# Patient Record
Sex: Male | Born: 1975 | Hispanic: Yes | Smoking: Never smoker
Health system: Southern US, Community
[De-identification: ages and names within clinical notes are randomized; demographics above are authoritative.]

## PROBLEM LIST (undated history)

## (undated) DIAGNOSIS — R7989 Other specified abnormal findings of blood chemistry: Secondary | ICD-10-CM

## (undated) DIAGNOSIS — I493 Ventricular premature depolarization: Secondary | ICD-10-CM

## (undated) DIAGNOSIS — M810 Age-related osteoporosis without current pathological fracture: Secondary | ICD-10-CM

## (undated) DIAGNOSIS — R945 Abnormal results of liver function studies: Secondary | ICD-10-CM

## (undated) DIAGNOSIS — J302 Other seasonal allergic rhinitis: Secondary | ICD-10-CM

## (undated) DIAGNOSIS — K5090 Crohn's disease, unspecified, without complications: Secondary | ICD-10-CM

## (undated) DIAGNOSIS — K8301 Primary sclerosing cholangitis: Secondary | ICD-10-CM

## (undated) HISTORY — DX: Primary sclerosing cholangitis: K83.01

## (undated) HISTORY — DX: Other seasonal allergic rhinitis: J30.2

## (undated) HISTORY — DX: Other specified abnormal findings of blood chemistry: R79.89

## (undated) HISTORY — DX: Age-related osteoporosis without current pathological fracture: M81.0

## (undated) HISTORY — DX: Abnormal results of liver function studies: R94.5

## (undated) HISTORY — DX: Crohn's disease, unspecified, without complications: K50.90

## (undated) HISTORY — DX: Ventricular premature depolarization: I49.3

---

## 2004-08-21 ENCOUNTER — Encounter: Admission: RE | Admit: 2004-08-21 | Discharge: 2004-08-21 | Payer: Self-pay | Admitting: Internal Medicine

## 2005-12-31 ENCOUNTER — Encounter: Admission: RE | Admit: 2005-12-31 | Discharge: 2005-12-31 | Payer: Self-pay | Admitting: Gastroenterology

## 2007-06-18 ENCOUNTER — Encounter: Admission: RE | Admit: 2007-06-18 | Discharge: 2007-06-18 | Payer: Self-pay | Admitting: Gastroenterology

## 2007-06-28 ENCOUNTER — Encounter: Admission: RE | Admit: 2007-06-28 | Discharge: 2007-06-28 | Payer: Self-pay | Admitting: Gastroenterology

## 2007-08-20 ENCOUNTER — Encounter (INDEPENDENT_AMBULATORY_CARE_PROVIDER_SITE_OTHER): Payer: Self-pay | Admitting: Interventional Radiology

## 2007-08-20 ENCOUNTER — Ambulatory Visit (HOSPITAL_COMMUNITY): Admission: RE | Admit: 2007-08-20 | Discharge: 2007-08-20 | Payer: Self-pay | Admitting: Gastroenterology

## 2007-08-25 ENCOUNTER — Emergency Department (HOSPITAL_COMMUNITY): Admission: EM | Admit: 2007-08-25 | Discharge: 2007-08-25 | Payer: Self-pay | Admitting: Emergency Medicine

## 2007-08-26 ENCOUNTER — Emergency Department (HOSPITAL_COMMUNITY): Admission: EM | Admit: 2007-08-26 | Discharge: 2007-08-27 | Payer: Self-pay | Admitting: *Deleted

## 2007-09-06 ENCOUNTER — Ambulatory Visit (HOSPITAL_COMMUNITY): Admission: RE | Admit: 2007-09-06 | Discharge: 2007-09-06 | Payer: Self-pay | Admitting: Gastroenterology

## 2008-09-09 IMAGING — CR DG ABDOMEN ACUTE W/ 1V CHEST
3 series · 3 of 3 positions shown · non-contrast
Comparison: CT 08/25/07.

CLINICAL DATA: 30 year-old, constipation.
 ACUTE ABDOMEN SERIES:

[w chest pa *]
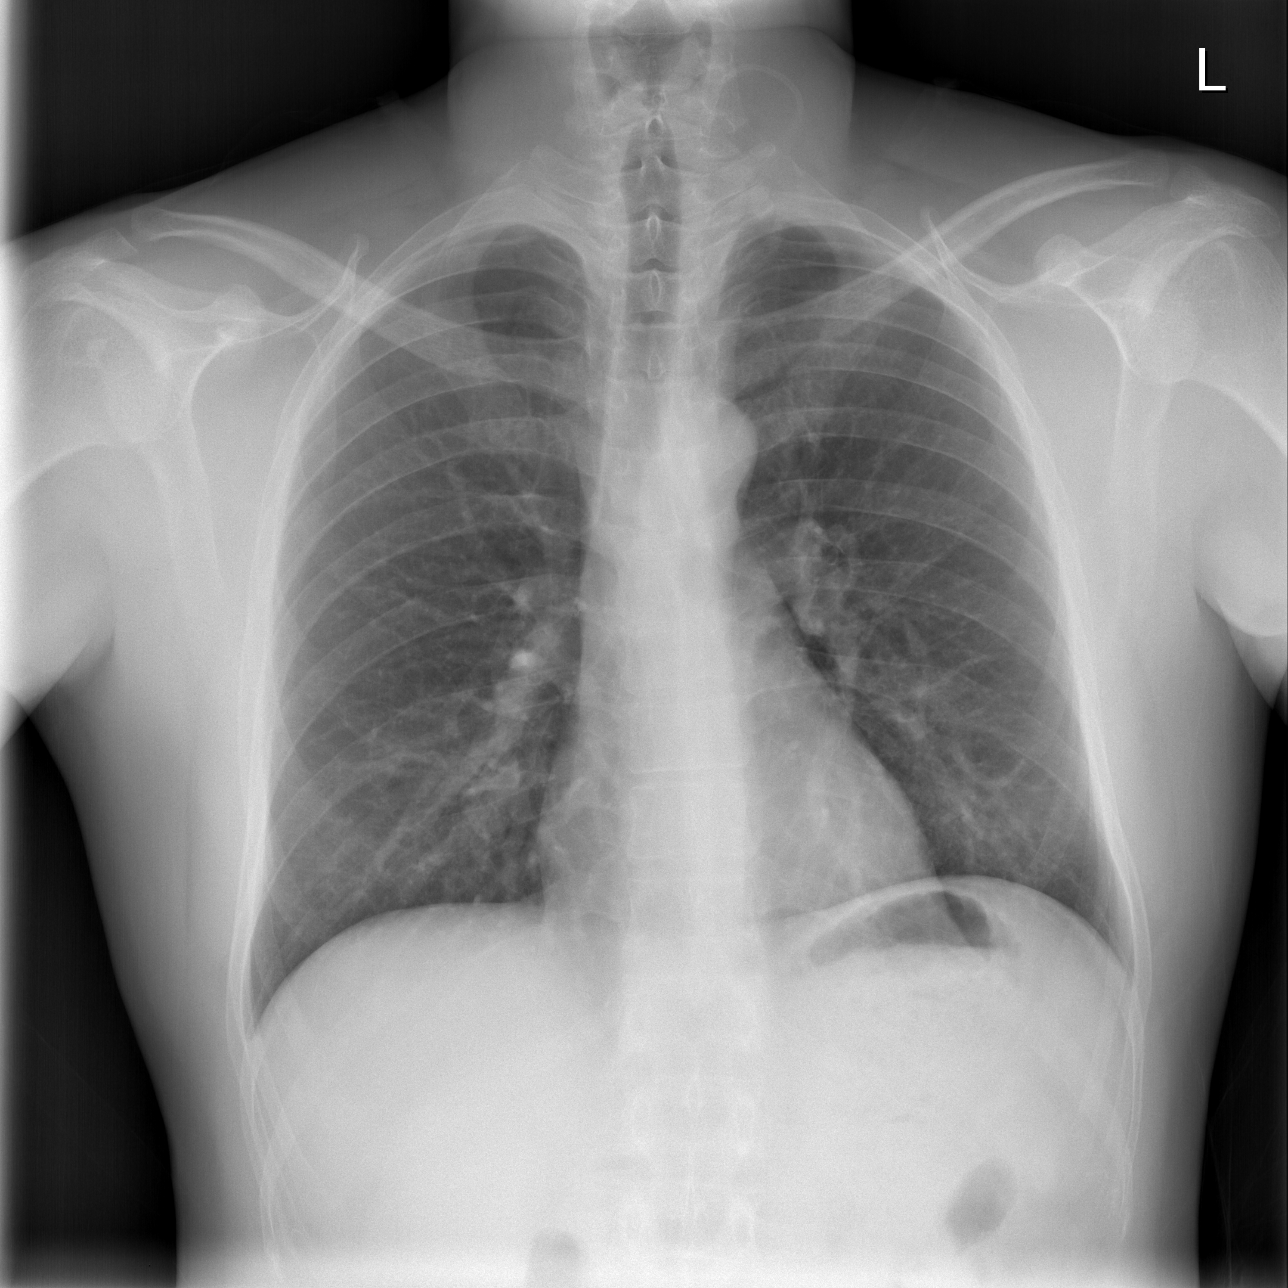

[w abdomen upright]
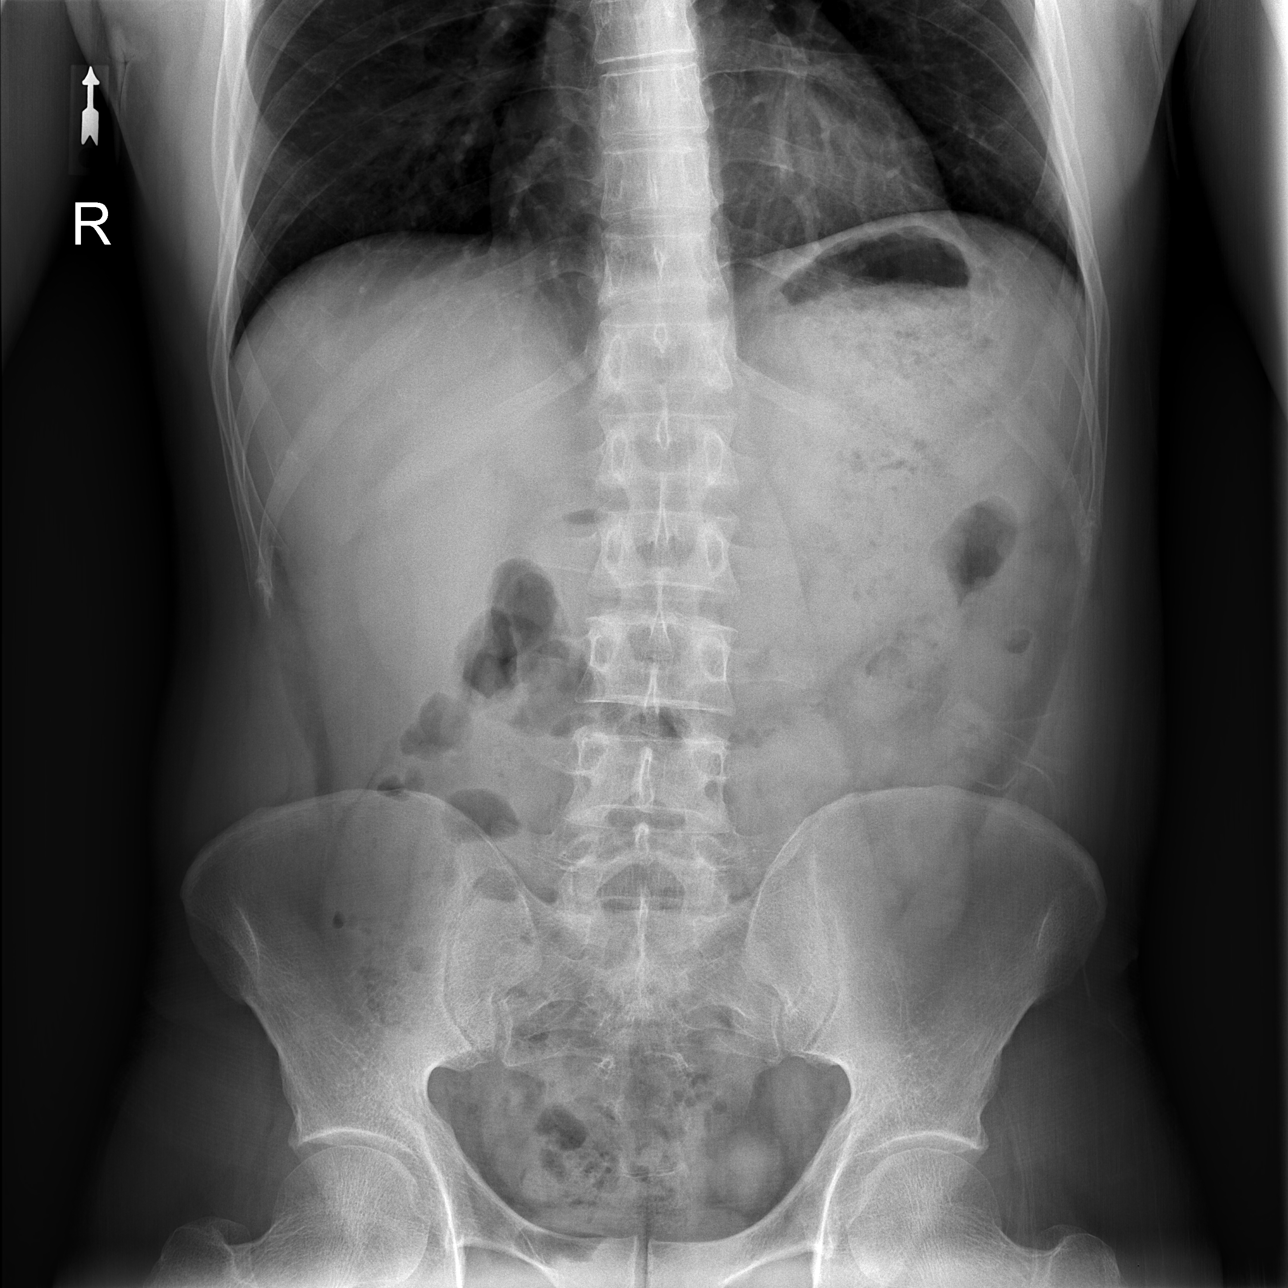

[t abdomen supine]
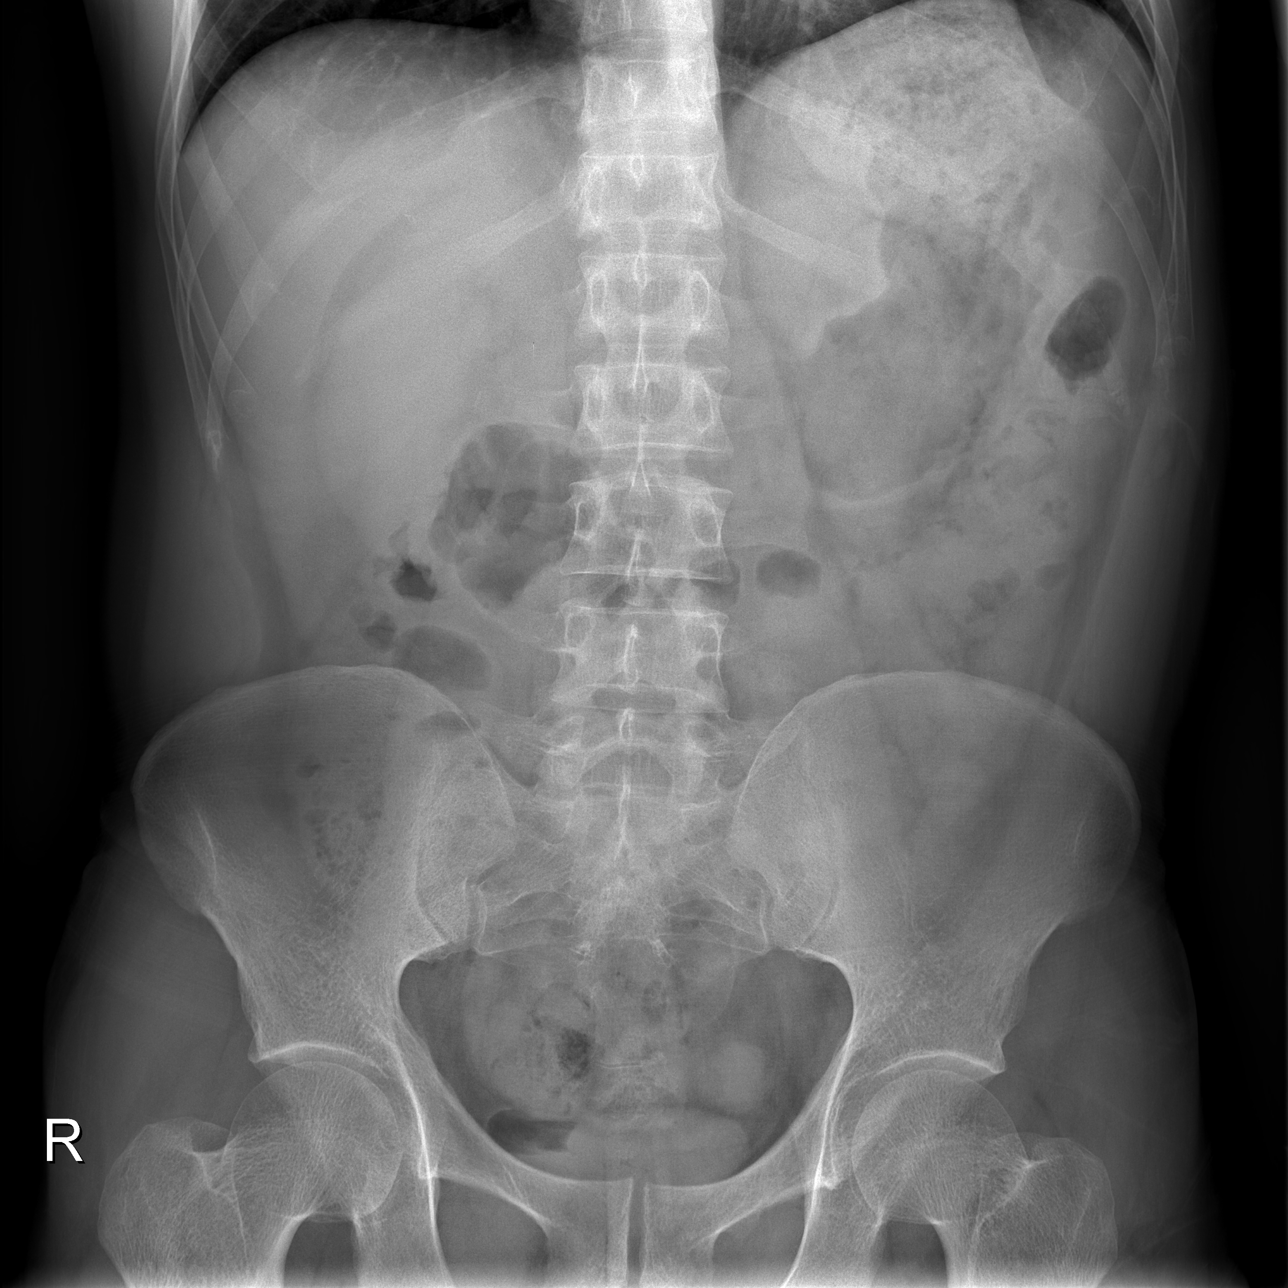

[3 of 3 positions shown; findings below may reference images not displayed]

FINDINGS: Cardiopericardial silhouette is within normal limits for size.  The lungs are clear. Supine and upright views of the abdomen demonstrate moderate amount of stool within the distal transverse and descending colon. There is no obstruction or free air.
IMPRESSION: 1.  No acute cardiopulmonary disease.
 2.  No obstruction or free air.
 3.  Moderate amount of stool in the descending colon.

## 2010-01-14 ENCOUNTER — Encounter: Admission: RE | Admit: 2010-01-14 | Discharge: 2010-01-14 | Payer: Self-pay | Admitting: Gastroenterology

## 2010-06-15 ENCOUNTER — Ambulatory Visit: Payer: Self-pay | Admitting: Diagnostic Radiology

## 2010-06-15 ENCOUNTER — Emergency Department (HOSPITAL_BASED_OUTPATIENT_CLINIC_OR_DEPARTMENT_OTHER): Admission: EM | Admit: 2010-06-15 | Discharge: 2010-06-15 | Payer: Self-pay | Admitting: Emergency Medicine

## 2010-07-12 ENCOUNTER — Ambulatory Visit: Payer: Self-pay | Admitting: Internal Medicine

## 2010-07-12 DIAGNOSIS — J189 Pneumonia, unspecified organism: Secondary | ICD-10-CM | POA: Insufficient documentation

## 2010-07-16 ENCOUNTER — Encounter: Payer: Self-pay | Admitting: Internal Medicine

## 2010-07-30 ENCOUNTER — Ambulatory Visit: Payer: Self-pay | Admitting: Internal Medicine

## 2010-07-30 DIAGNOSIS — J330 Polyp of nasal cavity: Secondary | ICD-10-CM | POA: Insufficient documentation

## 2010-08-13 ENCOUNTER — Encounter: Payer: Self-pay | Admitting: Internal Medicine

## 2010-08-15 ENCOUNTER — Telehealth (INDEPENDENT_AMBULATORY_CARE_PROVIDER_SITE_OTHER): Payer: Self-pay | Admitting: *Deleted

## 2010-08-26 ENCOUNTER — Encounter: Payer: Self-pay | Admitting: Internal Medicine

## 2010-11-04 ENCOUNTER — Encounter: Payer: Self-pay | Admitting: Internal Medicine

## 2010-11-24 HISTORY — PX: NASAL SINUS SURGERY: SHX719

## 2010-12-08 ENCOUNTER — Telehealth: Payer: Self-pay | Admitting: Internal Medicine

## 2010-12-11 ENCOUNTER — Encounter
Admission: RE | Admit: 2010-12-11 | Discharge: 2010-12-11 | Payer: Self-pay | Source: Home / Self Care | Attending: Gastroenterology | Admitting: Gastroenterology

## 2010-12-25 NOTE — Letter (Signed)
Summary: Records Dated 08-31-09 thru 07-09-10/[facility]  Records Dated 08-31-09 thru 07-09-10/[facility]   Imported By: Edmonia James 07/25/2010 10:34:07  _____________________________________________________________________  External Attachment:    Type:   Image     Comment:   External Document  Appended Document: Records Dated 08-31-09 thru 07-09-10/[facility] ENT Notes Chest x-ray 07-09-10 right middle lobe pneumonia

## 2010-12-25 NOTE — Assessment & Plan Note (Signed)
Summary: pain rt lung/cbs   Vital Signs:  Patient profile:   35 year old male Height:      66.5 inches Weight:      143.50 pounds BMI:     22.90 Pulse rate:   99 / minute Pulse rhythm:   regular BP sitting:   138 / 80  (left arm) Cuff size:   large  Vitals Entered By: Allyn Kenner CMA (July 12, 2010 3:35 PM) CC: 2nd Opinon on Pneumoia?  Comments Had a sinus infection 3 weeks ago was on ATB A week after developed a sharp pain in LLQ went to ER had xray and CT done was told he had water outside of lung then having pain in lower back and shoulders Was scheduled for Sinus Surgery next week Dr Nile Dear said he needed a Chest xray before had that done on Tuesday and was told he had Pneumoia- upset because he said that was the first time he had heard that.    History of Present Illness: new patient Last office visit approximately  6 years ago since the last office visit, he has been diagnosed with Primary sclerosing cholangitis and  Crohn's disease.   He is here w/ the following history  approximately 4 weeks ago developed cough, sputum production, chest congestion and shortness of breath on and off. Symptoms were mostly at night. He went to see his ENT doctor, Dr Nile Dear ([facility]) who describes some medication, prednisone? ( was prescribed 5 the first day, for the next day, 3 the next day etc. etc.) After that he felt better temporarily. On 06-15-10 he went to the ER with severe left-sided chest pain, she was diagnosed with possibly pleuresy and was prescribe Percocet. He went to ENT again, the pain then went from the left to the right side of the chest. 3 days ago, he had a chest x-ray for pre-surgical evaluation (he will have a  sinus polypectomy) and it showed pneumonia. The patient was prescribed cefnidir  ER records from 06-15-10 Lipase normal, potassium 4.0, creatinine 0.8 AST 39 slightly elevated, ALT 102, elevated. Alkaline phosphatase 248 elevated D-dimer 1.44  elevated White blood cells 12.5, hemoglobin 11.8, platelets 429 Chest x-ray negative CT angiogram of the chest show a tiny left pleural effusion otherwise negative   Current Medications (verified): 1)  Urosodil 50 Mg .Marland Kitchen.. 1 By Mouth Two Times A Day 2)  Lialda 1.2 Gm Tbec (Mesalamine) .... 2 By Mouth Daily. 3)  Prednisone 20 Mg Tabs (Prednisone) .... 2 By Mouth Two Times A Day 4)  Cefdinir 300 Mg Caps (Cefdinir) .Marland Kitchen.. 1 By Mouth Two Times A Day  Allergies (verified): No Known Drug Allergies  Past History:  Past Medical History: Primary sclerosing cholangitis.   Crohn's disease. Elevated liver function tests.  Past Surgical History: no major   Family History: prostate ca--no colon ca--no lung ca--no  Social History: single no children tobacco--no ETOH--no original from [place]   Review of Systems       for the last couple of weeks, he has been feeling better Denies fever No nausea or vomiting He did have diarrhea for a few days with some blood. His GI doctor prescribed prednisone No cough, sputum production or shortness of breath for the last 2 weeks The chest pain is resolved, if anything he is a slightly sore at the bases of the chest.  Physical Exam  General:  alert, well-developed, and well-nourished.   Head:  face symmetric Nose:  not congested Lungs:  normal respiratory effort, no intercostal retractions, and no accessory muscle use.  no increased work of breathing, lungs are clear, slightly decreased breath sounds at bases Heart:  normal rate, regular rhythm, no murmur, and no gallop.   Abdomen:  soft, non-tender, no distention, no masses, no guarding, and no rigidity.   Extremities:  number lower extremity edema   Impression & Recommendations:  Problem # 1:  PNEUMONIA (ICD-486) see HPI, he had CP 4 weeks ago, apparently was described prednisone, after that he went to the ER and a CT of the chest was negative. In the last 2 weeks he has  improved however 3 days ago a chest x-ray showed pneumonia Is possible that he had pneumonia w/ atypical sx because sx were  masked by steroids. Plan: Continue with   Cefdinir 300 Mg Caps  b.i.d. for 10 days Add a Z-Pak to cover atypicals Get records from ENT get chest x-ray report Return to the office in 2 weeks  Problem # 2:  time spent >> 25 min gathering info about the ER visit   Complete Medication List: 1)  Urosodil 50 Mg  .Marland KitchenMarland Kitchen. 1 by mouth two times a day 2)  Lialda 1.2 Gm Tbec (Mesalamine) .... 2 by mouth daily. 3)  Prednisone 20 Mg Tabs (Prednisone) .... 2 by mouth two times a day 4)  Cefdinir 300 Mg Caps (Cefdinir) .Marland Kitchen.. 1 by mouth two times a day 5)  Zithromax Z-pak 250 Mg Tabs (Azithromycin) .... As directed  Patient Instructions: 1)   Please fax a release of information to ENT and radiology 2)  Please schedule a follow-up appointment in 2 weeks.  Prescriptions: ZITHROMAX Z-PAK 250 MG TABS (AZITHROMYCIN) as directed  #1 x 0   Entered and Authorized by:   Alda Berthold. Paz MD   Signed by:   Alda Berthold. Paz MD on 07/12/2010   Method used:   Print then Give to Patient   RxID:   [id number]

## 2010-12-25 NOTE — Letter (Signed)
Summary: [facility]  [facility]   Imported By: Edmonia James 08/06/2010 07:50:35  _____________________________________________________________________  External Attachment:    Type:   Image     Comment:   External Document

## 2010-12-25 NOTE — Assessment & Plan Note (Signed)
Summary: 2 week followup/kn   Vital Signs:  Patient profile:   35 year old male Weight:      146 pounds Pulse rate:   84 / minute Pulse rhythm:   regular BP sitting:   120 / 78  (left arm) Cuff size:   large  Vitals Entered By: Allyn Kenner CMA (July 30, 2010 10:48 AM) CC: 2 week f/u - Feeling better.  Comments X-rays?    History of Present Illness: here for followup Old records are reviewed, on 07-09-10 he had a chest x-ray and showed  a right middle lobe pneumonia He has finished his antibiotics without apparent side effects  ROS Denies current fever, cough. Still has the ill-defined discomfort on the right chest, much improvement compared to a couple of weeks ago. his sinus symptoms are improved with nasal steroids  Current Medications (verified): 1)  Urosodil 50 Mg .Marland Kitchen.. 1 By Mouth Two Times A Day 2)  Lialda 1.2 Gm Tbec (Mesalamine) .... 2 By Mouth Daily. 3)  Prednisone 20 Mg Tabs (Prednisone) .Marland Kitchen.. 1 1/2 By Mouth Two Times A Day For 2 Weeks.  Allergies (verified): No Known Drug Allergies  Past History:  Past Medical History: Reviewed history from 07/12/2010 and no changes required. Primary sclerosing cholangitis.   Crohn's disease. Elevated liver function tests.  Past Surgical History: Reviewed history from 07/12/2010 and no changes required. no major   Social History: Reviewed history from 07/12/2010 and no changes required. single no children tobacco--no ETOH--no original from [place]  Physical Exam  General:  alert, well-developed, and well-nourished.   Lungs:  normal respiratory effort, no intercostal retractions, no accessory muscle use, and normal breath sounds.   Heart:  normal rate, regular rhythm, and no murmur.     Impression & Recommendations:  Problem # 1:  PNEUMONIA (ICD-486) improving plan: Chest x-ray in 2 weeks The following medications were removed from the medication list:    Cefdinir 300 Mg Caps (Cefdinir)  .Marland Kitchen... 1 by mouth two times a day    Zithromax Z-pak 250 Mg Tabs (Azithromycin) .Marland Kitchen... As directed  Problem # 2:  NASAL POLYP (ICD-471.0) history of nasal polyps, deviated septum, frequent sinusitis Planning  to have surgery per ENT  Complete Medication List: 1)  Urosodil 50 Mg  .Marland KitchenMarland Kitchen. 1 by mouth two times a day 2)  Lialda 1.2 Gm Tbec (Mesalamine) .... 2 by mouth daily. 3)  Prednisone 20 Mg Tabs (Prednisone) .Marland Kitchen.. 1 1/2 by mouth two times a day for 2 weeks. 4)  Chest X-ray  .... Chest x-ray, pa and lateral dx pneumonia to be done at [facility] fax results to dr Larose Kells [phone]  Patient Instructions: 1)  Please schedule a follow-up appointment in 3 months, fasting, physical exam Prescriptions: CHEST X-RAY chest x-ray, PA and lateral DX pneumonia To be done at [place] [id number]  #0 x 0   Entered and Authorized by:   Alda Berthold. Paz MD   Signed by:   Alda Berthold. Paz MD on 07/30/2010   Method used:   Print then Give to Patient   RxID:   [id number]

## 2010-12-25 NOTE — Progress Notes (Signed)
Summary: XRAYS RESULTS  Phone Note Call from Patient Call back at [facility] Phone [phone]   Caller: Patient Summary of Call: PATIENT SAID HE HAD XRAYS TWO DAYS AGO AND HASNT HEARD THE RESULTS---PLEASE CALL HIM AT [phone] Initial call taken by: Berneta Sages,  August 15, 2010 9:53 AM  Follow-up for Phone Call        Spoke with pt and informed him that we have not got X-ray REsults. [facility] will fax them over to [place]. [place]  August 15, 2010 12:48 PM

## 2010-12-25 NOTE — Letter (Signed)
Summary: [facility]  [facility]   Imported By: Edmonia James 09/04/2010 11:17:43  _____________________________________________________________________  External Attachment:    Type:   Image     Comment:   External Document

## 2010-12-26 NOTE — Letter (Signed)
Summary: [facility]  [facility]   Imported By: Edmonia James 11/14/2010 11:32:43  _____________________________________________________________________  External Attachment:    Type:   Image     Comment:   External Document

## 2010-12-26 NOTE — Progress Notes (Signed)
Summary: due CXR  Phone Note Outgoing Call   Summary of Call: advise patient: due for repeated CXR : dx abnormal CXR to be done at [facility] (were he had the original XR) Sylvan Lahm E. Kelvin Sennett MD  December 08, 2010 2:59 PM   Follow-up for Phone Call        Pt is aware, he will pick up Chest Xray order to take w/ him. Allyn Kenner CMA  December 09, 2010 2:12 PM     New/Updated Medications: * CHEST X-RAY CXR : dx abnormal CXR done at [place] [id number] Prescriptions: CHEST X-RAY CXR : dx abnormal CXR done at [place] [phone] [id number]  #0 x 0   Entered by:   Allyn Kenner CMA   Authorized by:   Alda Berthold. Venba Zenner MD   Signed by:   Allyn Kenner CMA on 12/09/2010   Method used:   Print then Give to Patient   RxID:   [id number]

## 2011-02-08 LAB — COMPREHENSIVE METABOLIC PANEL
ALT: 102 U/L — ABNORMAL HIGH (ref 0–53)
AST: 39 U/L — ABNORMAL HIGH (ref 0–37)
Albumin: 4.2 g/dL (ref 3.5–5.2)
Alkaline Phosphatase: 248 U/L — ABNORMAL HIGH (ref 39–117)
Potassium: 4 mEq/L (ref 3.5–5.1)
Sodium: 143 mEq/L (ref 135–145)
Total Bilirubin: 0.9 mg/dL (ref 0.3–1.2)

## 2011-02-08 LAB — DIFFERENTIAL
Basophils Absolute: 0 10*3/uL (ref 0.0–0.1)
Eosinophils Absolute: 0.5 10*3/uL (ref 0.0–0.7)
Monocytes Absolute: 1.3 10*3/uL — ABNORMAL HIGH (ref 0.1–1.0)
Monocytes Relative: 11 % (ref 3–12)
Neutro Abs: 8.2 10*3/uL — ABNORMAL HIGH (ref 1.7–7.7)
Neutrophils Relative %: 65 % (ref 43–77)

## 2011-02-08 LAB — CBC
MCHC: 31.4 g/dL (ref 30.0–36.0)
Platelets: 429 10*3/uL — ABNORMAL HIGH (ref 150–400)
RBC: 4.77 MIL/uL (ref 4.22–5.81)
RDW: 15.7 % — ABNORMAL HIGH (ref 11.5–15.5)

## 2011-02-08 LAB — D-DIMER, QUANTITATIVE: D-Dimer, Quant: 1.44 ug/mL-FEU — ABNORMAL HIGH (ref 0.00–0.48)

## 2011-02-08 LAB — LIPASE, BLOOD: Lipase: 250 U/L (ref 23–300)

## 2011-02-21 ENCOUNTER — Other Ambulatory Visit: Payer: Self-pay | Admitting: Gastroenterology

## 2011-03-18 ENCOUNTER — Encounter (HOSPITAL_COMMUNITY)
Admission: RE | Admit: 2011-03-18 | Discharge: 2011-03-18 | Disposition: A | Discharge status: Home / Self Care | Payer: BC Managed Care – PPO | Source: Ambulatory Visit | Attending: Gastroenterology | Admitting: Gastroenterology

## 2011-03-18 DIAGNOSIS — D649 Anemia, unspecified: Secondary | ICD-10-CM | POA: Insufficient documentation

## 2011-03-18 LAB — ABO/RH: ABO/RH(D): B POS

## 2011-03-19 ENCOUNTER — Ambulatory Visit (HOSPITAL_COMMUNITY): Payer: BC Managed Care – PPO | Attending: Gastroenterology

## 2011-03-19 ENCOUNTER — Encounter (HOSPITAL_COMMUNITY): Payer: BC Managed Care – PPO

## 2011-03-19 ENCOUNTER — Other Ambulatory Visit: Payer: Self-pay | Admitting: Gastroenterology

## 2011-03-19 DIAGNOSIS — D649 Anemia, unspecified: Secondary | ICD-10-CM | POA: Insufficient documentation

## 2011-03-19 LAB — CBC
HCT: 34.1 % — ABNORMAL LOW (ref 39.0–52.0)
Hemoglobin: 9.6 g/dL — ABNORMAL LOW (ref 13.0–17.0)
MCH: 17.9 pg — ABNORMAL LOW (ref 26.0–34.0)
MCHC: 28.2 g/dL — ABNORMAL LOW (ref 30.0–36.0)
MCV: 63.5 fL — ABNORMAL LOW (ref 78.0–100.0)
Platelets: 390 10*3/uL (ref 150–400)
RDW: 21.8 % — ABNORMAL HIGH (ref 11.5–15.5)

## 2011-03-20 ENCOUNTER — Encounter (HOSPITAL_COMMUNITY): Payer: BC Managed Care – PPO

## 2011-03-20 LAB — CROSSMATCH
ABO/RH(D): B POS
Unit division: 0

## 2011-06-30 IMAGING — CT CT ANGIO CHEST
2 of 6 series · 19 of 36 positions shown · IV contrast (APPLIED)
Comparison: None.

CLINICAL DATA: Left-sided chest pain which began earlier today.
Elevated D-dimer.

CT ANGIOGRAPHY CHEST WITH CONTRAST 06/15/2010:
TECHNIQUE: Multidetector CT imaging of the chest was performed
using the standard protocol during bolus administration of
intravenous contrast.  Multiplanar CT image reconstructions
including MIPs were obtained to evaluate the vascular anatomy.
Contrast:  80 ml Omnipaque 350 IV.

[Series 5: pe 1.0 b25f · axial · 0.64mm/px · z∈[-259,-22]mm · 18 of 265 slices shown]
[im 14/265  lung]
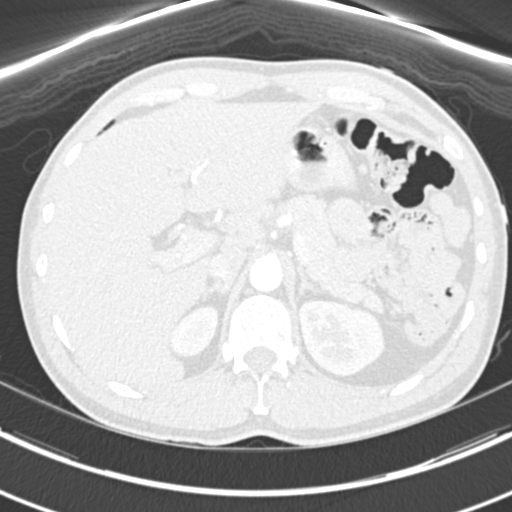
[im 27/265  mediastinal]
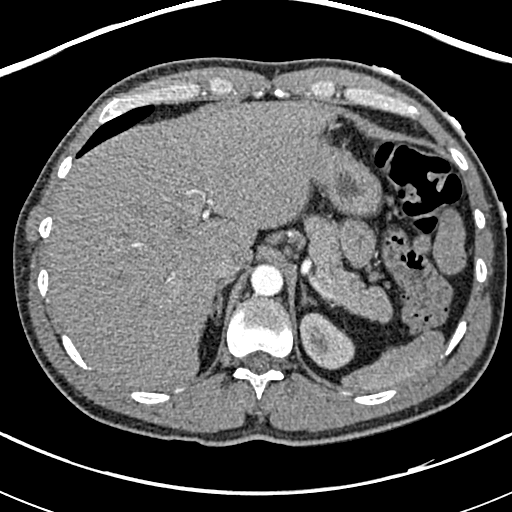
[im 40/265  lung]
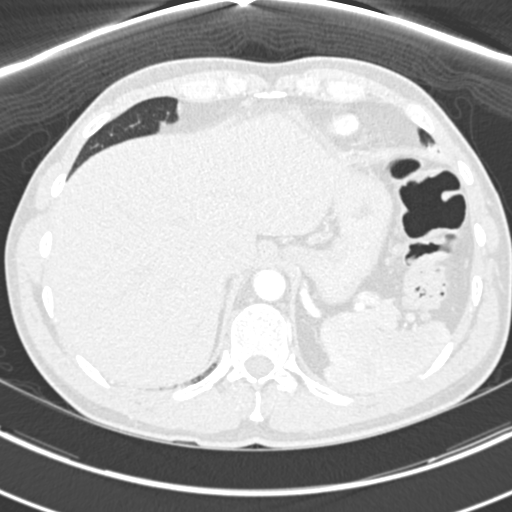
[im 53/265  mediastinal]
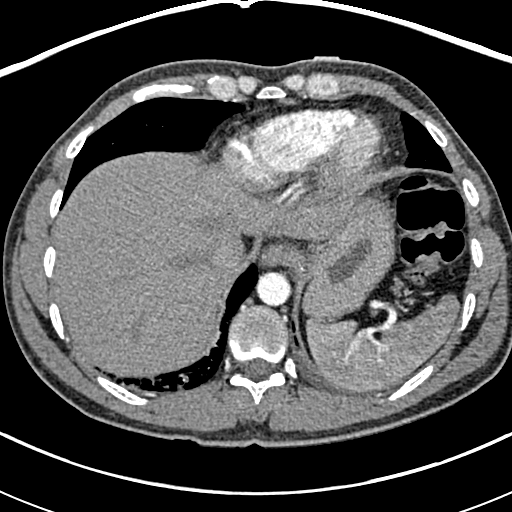
[im 67/265  lung]
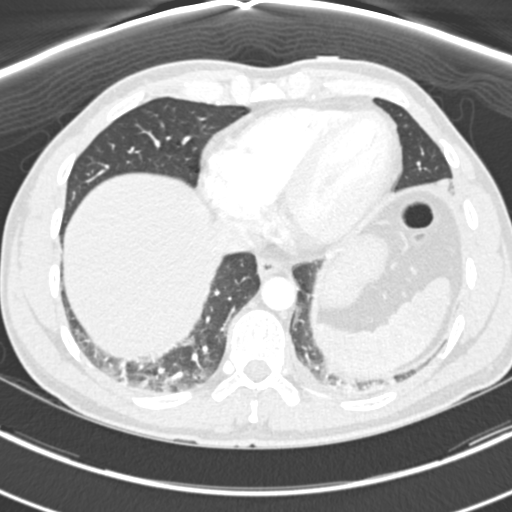
[im 80/265  mediastinal]
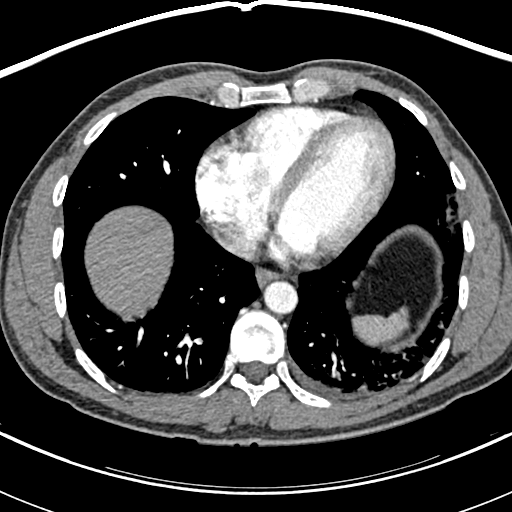
[im 93/265  lung]
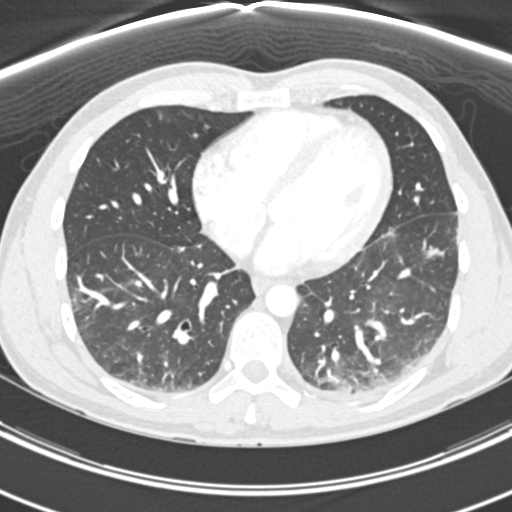
[im 106/265  mediastinal]
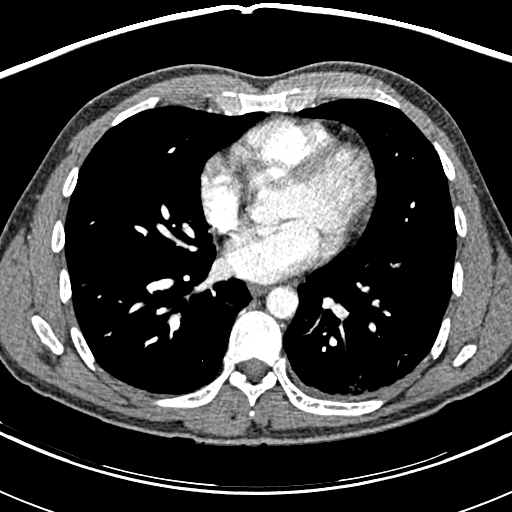
[im 119/265  lung]
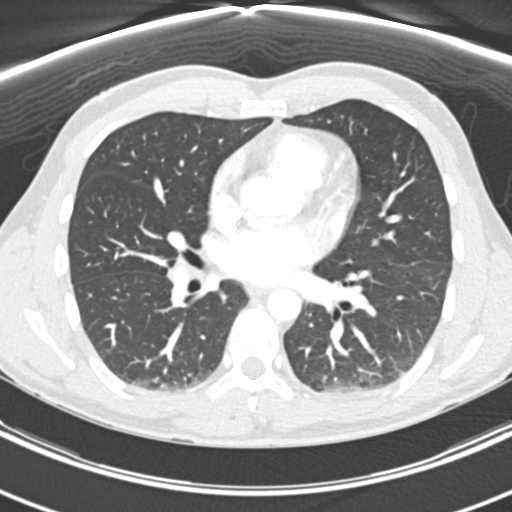
[im 146/265  mediastinal]
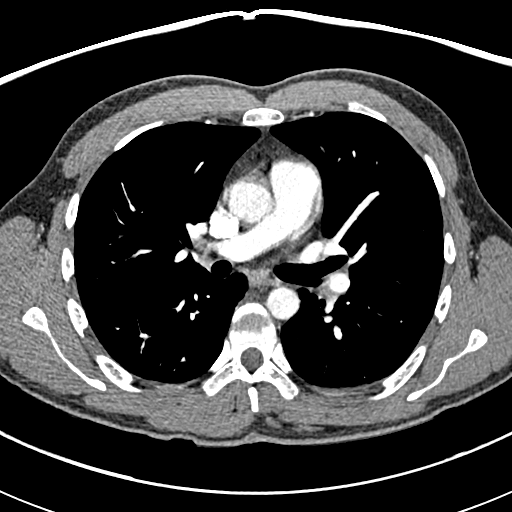
[im 159/265  lung]
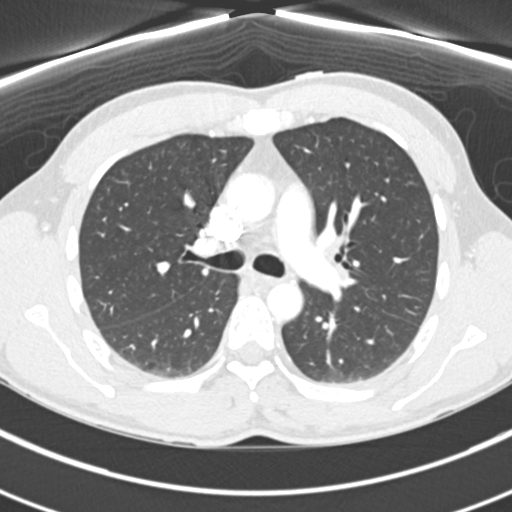
[im 172/265  mediastinal]
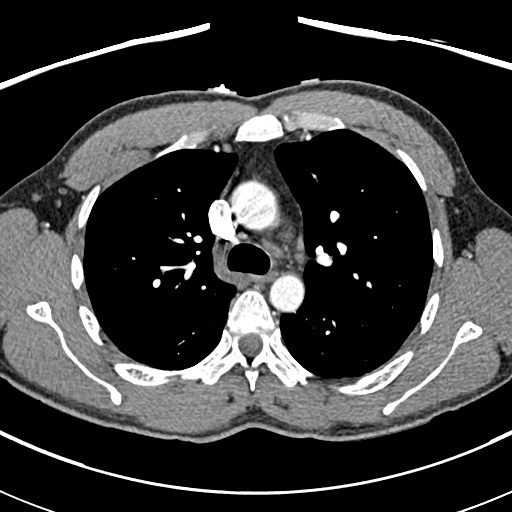
[im 185/265  lung]
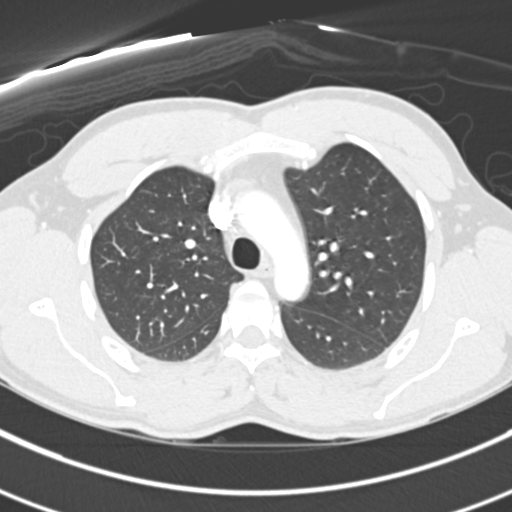
[im 199/265  mediastinal]
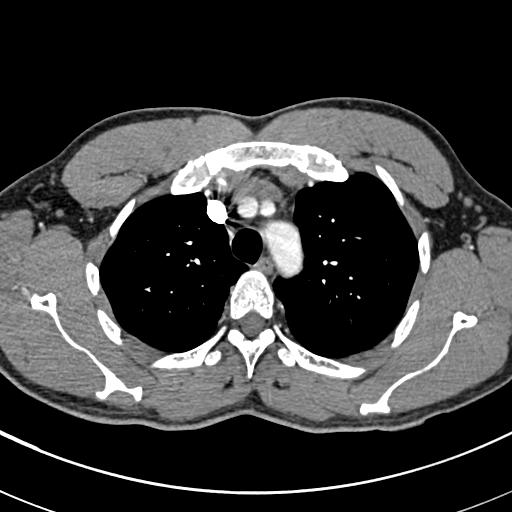
[im 212/265  lung]
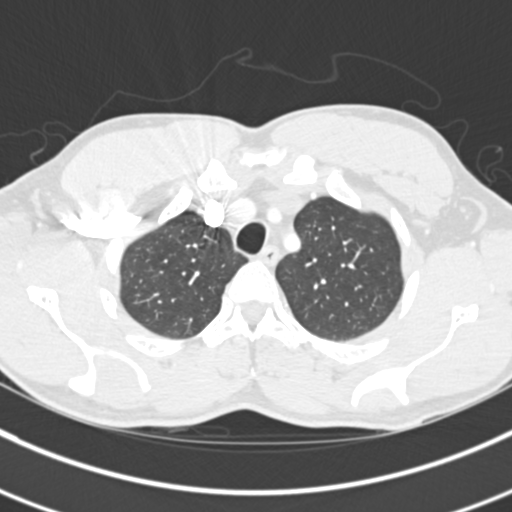
[im 225/265  mediastinal]
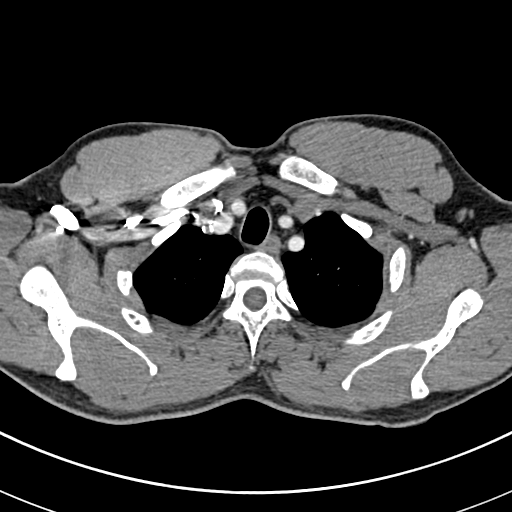
[im 238/265  lung]
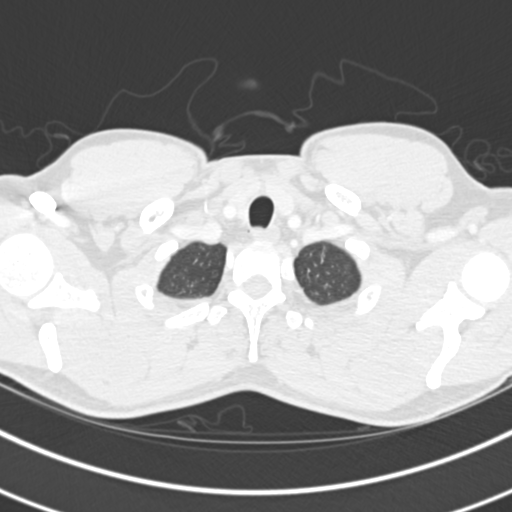
[im 251/265  mediastinal]
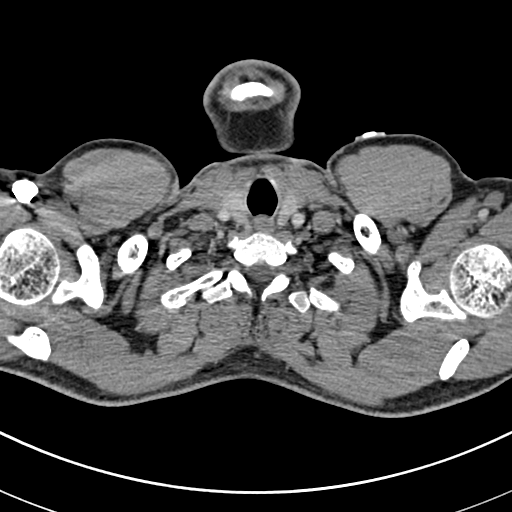

[Series 7: pe 2.0 coronal · coronal · 0.61mm/px · 1 of 124 slices shown]
[im 62/124  mediastinal]
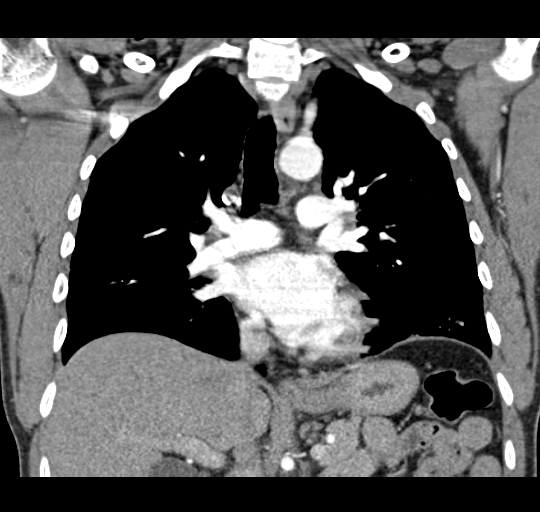

[19 of 36 positions shown; findings below may reference images not displayed]

FINDINGS: Contrast opacification of the pulmonary arteries is
good.  No filling defects within either main pulmonary artery or
their branches in either lung to suggest pulmonary embolism.  Heart
size normal.  Approximate 1.0 x 0.6 cm fat-containing nodule
involving the posterolateral wall of the the right atrium.  No
visible coronary artery calcification.  No visible atherosclerosis
involving the thoracic or upper abdominal aorta with their
branches.  No pericardial effusion.

Mild atelectasis deep in both lower lobes.  Lungs otherwise clear
without localized airspace consolidation, interstitial disease, or
nodularity.  Central airways patent without significant bronchial
wall thickening.  Tiny left pleural effusion.  No right pleural
effusion.

Scattered normal-sized lymph nodes throughout the mediastinum, both
hila, and both axillae; no significant lymphadenopathy.  Visualized
thyroid gland unremarkable.  Visualized upper abdomen unremarkable.
Bone window images unremarkable.

Review of the MIP images confirms the above findings.
IMPRESSION: 1.  No evidence of pulmonary embolism.
2.  Incidental approximate 1 cm lipoma involving the posterolateral
wall of the right atrium.
3.  Mild atelectasis deep in the lower lobes.  Tiny left pleural
effusion.  No acute cardiopulmonary disease otherwise.

## 2011-09-04 LAB — CBC
HCT: 41
HCT: 45.9
Hemoglobin: 14.3
Hemoglobin: 15.7
MCHC: 35.2
MCV: 89
Platelets: 273
Platelets: 251
RBC: 4.58
RDW: 13.2
WBC: 7.7

## 2011-09-04 LAB — URINALYSIS, ROUTINE W REFLEX MICROSCOPIC
Glucose, UA: NEGATIVE
Ketones, ur: NEGATIVE
Nitrite: NEGATIVE
Protein, ur: NEGATIVE
Urobilinogen, UA: 1
pH: 7
pH: 7

## 2011-09-04 LAB — COMPREHENSIVE METABOLIC PANEL
ALT: 201 — ABNORMAL HIGH
AST: 56 — ABNORMAL HIGH
Albumin: 4.3
Alkaline Phosphatase: 484 — ABNORMAL HIGH
BUN: 9
CO2: 30
Calcium: 9.6
Creatinine, Ser: 0.79
GFR calc Af Amer: >60
GFR calc non Af Amer: >60
Glucose, Bld: 124 — ABNORMAL HIGH
Glucose, Bld: 184 — ABNORMAL HIGH
Potassium: 4.3
Sodium: 135
Total Protein: 8.5 — ABNORMAL HIGH

## 2011-09-04 LAB — DIFFERENTIAL
Basophils Relative: 1
Eosinophils Absolute: 0.1
Eosinophils Absolute: 0.1
Lymphocytes Relative: 16
Lymphs Abs: 1.2
Lymphs Abs: 1.4
Monocytes Absolute: 0.8 — ABNORMAL HIGH
Monocytes Relative: 6
Neutrophils Relative %: 73

## 2011-09-04 LAB — PROTIME-INR: Prothrombin Time: 13

## 2012-05-13 ENCOUNTER — Other Ambulatory Visit: Payer: Self-pay | Admitting: Gastroenterology

## 2012-05-13 DIAGNOSIS — R7989 Other specified abnormal findings of blood chemistry: Secondary | ICD-10-CM

## 2012-05-19 ENCOUNTER — Ambulatory Visit
Admission: RE | Admit: 2012-05-19 | Discharge: 2012-05-19 | Disposition: A | Discharge status: Home / Self Care | Payer: BC Managed Care – PPO | Source: Ambulatory Visit | Attending: Gastroenterology | Admitting: Gastroenterology

## 2012-05-19 DIAGNOSIS — R7989 Other specified abnormal findings of blood chemistry: Secondary | ICD-10-CM

## 2012-05-19 MED ORDER — GADOBENATE DIMEGLUMINE 529 MG/ML IV SOLN
16.0000 mL | Freq: Once | INTRAVENOUS | Status: AC | PRN
  Administered 2012-05-19: 16 mL via INTRAVENOUS

## 2013-04-13 ENCOUNTER — Other Ambulatory Visit: Payer: Self-pay | Admitting: Gastroenterology

## 2013-04-13 DIAGNOSIS — K8309 Other cholangitis: Secondary | ICD-10-CM

## 2013-04-23 ENCOUNTER — Other Ambulatory Visit: Payer: BC Managed Care – PPO

## 2013-04-30 ENCOUNTER — Other Ambulatory Visit: Payer: BC Managed Care – PPO

## 2013-05-07 ENCOUNTER — Ambulatory Visit
Admission: RE | Admit: 2013-05-07 | Discharge: 2013-05-07 | Disposition: A | Discharge status: Home / Self Care | Payer: BC Managed Care – PPO | Source: Ambulatory Visit | Attending: Gastroenterology | Admitting: Gastroenterology

## 2013-05-07 DIAGNOSIS — K8309 Other cholangitis: Secondary | ICD-10-CM

## 2013-05-07 MED ORDER — GADOBENATE DIMEGLUMINE 529 MG/ML IV SOLN
16.0000 mL | Freq: Once | INTRAVENOUS | Status: AC | PRN
  Administered 2013-05-07: 16 mL via INTRAVENOUS

## 2014-10-16 ENCOUNTER — Other Ambulatory Visit: Payer: Self-pay | Admitting: Gastroenterology

## 2015-06-01 ENCOUNTER — Ambulatory Visit: Payer: Self-pay | Admitting: Internal Medicine

## 2015-06-01 ENCOUNTER — Ambulatory Visit (INDEPENDENT_AMBULATORY_CARE_PROVIDER_SITE_OTHER): Payer: BLUE CROSS/BLUE SHIELD | Admitting: Internal Medicine

## 2015-06-01 ENCOUNTER — Encounter: Payer: Self-pay | Admitting: Internal Medicine

## 2015-06-01 VITALS — BP 102/70 | HR 68 | Temp 98.0°F | Ht 66.5 in | Wt 177.0 lb

## 2015-06-01 DIAGNOSIS — R7989 Other specified abnormal findings of blood chemistry: Secondary | ICD-10-CM

## 2015-06-01 DIAGNOSIS — K5090 Crohn's disease, unspecified, without complications: Secondary | ICD-10-CM | POA: Insufficient documentation

## 2015-06-01 DIAGNOSIS — IMO0002 Reserved for concepts with insufficient information to code with codable children: Secondary | ICD-10-CM | POA: Insufficient documentation

## 2015-06-01 DIAGNOSIS — K50919 Crohn's disease, unspecified, with unspecified complications: Secondary | ICD-10-CM | POA: Diagnosis not present

## 2015-06-01 DIAGNOSIS — L03011 Cellulitis of right finger: Secondary | ICD-10-CM

## 2015-06-01 DIAGNOSIS — R945 Abnormal results of liver function studies: Secondary | ICD-10-CM

## 2015-06-01 DIAGNOSIS — K8301 Primary sclerosing cholangitis: Secondary | ICD-10-CM

## 2015-06-01 DIAGNOSIS — M810 Age-related osteoporosis without current pathological fracture: Secondary | ICD-10-CM | POA: Diagnosis not present

## 2015-06-01 HISTORY — DX: Age-related osteoporosis without current pathological fracture: M81.0

## 2015-06-01 MED ORDER — DOXYCYCLINE HYCLATE 100 MG PO TBEC: 100.0000 mg | DELAYED_RELEASE_TABLET | Freq: Two times a day (BID) | ORAL | Status: DC

## 2015-06-01 MED ORDER — CEPHALEXIN 500 MG PO CAPS: 500.0000 mg | ORAL_CAPSULE | Freq: Four times a day (QID) | ORAL | Status: DC

## 2015-06-01 MED ORDER — SULFACETAMIDE SODIUM (ACNE) 10 % EX LOTN
TOPICAL_LOTION | CUTANEOUS | Status: DC
Start: 2015-06-01 — End: 2015-12-25

## 2015-06-01 NOTE — Assessment & Plan Note (Signed)
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 Under the care of Dr. Michail Sermon. Labs from 04/2014: LFTs normal except for increased bilirubin, creatinine 0.7, potassium 4.7, CBC normal.

## 2015-06-01 NOTE — Assessment & Plan Note (Signed)
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

## 2015-06-01 NOTE — Assessment & Plan Note (Signed)
Patient was screened for osteoporosis by his GI doctors due to steroid use, dexa showed osteoporosis, subsequently he saw endocrinology, they felt that no treatment was indicated at the time and as long as he does not have a fracture or ongoing systemic steroid.

## 2015-06-01 NOTE — Patient Instructions (Signed)
Antibiotics  for one week  Apply the lotion twice a day until you see the specialist  Keep the area dry and uncover  Schedule a physical exam at your convenience

## 2015-06-01 NOTE — Progress Notes (Signed)
Pre visit review using our clinic review tool, if applicable. No additional management support is needed unless otherwise documented below in the visit note. 

## 2015-06-01 NOTE — Progress Notes (Signed)
Subjective:    Patient ID: Reginald Parker, male    DOB: [birth year], 39 y.o.   MRN: [id number]  DOS:  06/01/2015 Type of visit - description : New patient Interval history: Few months ago he had a infection at the right fifth finger, had swelling around the nail, he was able to get some pus out of it. Since then, the nail is changing color. 2 weeks ago the swelling come back, this time he has seen no discharge.  He has a extensive GI history, chart is reviewed Also osteoporosis, note from endocrinology reviewed.  Review of Systems  Denies any fever or chills No recent finger injury.  Past Medical History  Diagnosis Date  . Primary sclerosing cholangitis   . Crohn's disease     universal colitis  . Elevated liver function tests   . Osteoporosis 06/01/2015    Past Surgical History  Procedure Laterality Date  . Nasal sinus surgery  2012    History   Social History  . Marital Status: Single    Spouse Name: N/A  . Number of Children: 0  . Years of Education: N/A   Occupational History  . Dealer    Social History Main Topics  . Smoking status: Never Smoker   . Smokeless tobacco: Not on file  . Alcohol Use: No  . Drug Use: No  . Sexual Activity: Not on file   Other Topics Concern  . Not on file   Social History Narrative   Original from [facility]        Medication List       This list is accurate as of: 06/01/15  3:43 PM.  Always use your most recent med list.               cephALEXin 500 MG capsule  Commonly known as:  KEFLEX  Take 1 capsule (500 mg total) by mouth 4 (four) times daily.     cetirizine 10 MG tablet  Commonly known as:  ZYRTEC  Take 10 mg by mouth daily.     fluticasone 50 MCG/ACT nasal spray  Commonly known as:  FLONASE  Place 1 spray into both nostrils daily.     LIALDA 1.2 G EC tablet  Generic drug:  mesalamine  Take 4.8 g by mouth daily.     Sulfacetamide Sodium (Acne) 10 % Lotn  Commonly known as:  KLARON    Apply twice a day     ursodiol 500 MG tablet  Commonly known as:  ACTIGALL  Take 500 mg by mouth 2 (two) times daily.           Objective:   Physical Exam BP 102/70 mmHg  Pulse 68  Temp(Src) 98 F (36.7 C) (Oral)  Ht 5' 6.5" (1.689 m)  Wt 177 lb (80.287 kg)  BMI 28.14 kg/m2  SpO2 99% General:   Well developed, well nourished . NAD.  HEENT:  Normocephalic . Face symmetric, atraumatic Lungs:  CTA B Normal respiratory effort, no intercostal retractions, no accessory muscle use. Heart: RRR,  no murmur.  No pretibial edema bilaterally  Skin:  Right fifth finger: Mild swelling with minimal tenderness around the nail, no fluctuance or discharge. Minimal warmness. Pulp is soft and nontender Nail : Is a slightly loose, + dystrophy and is green in color on the borders. Neurologic:  alert & oriented X3.  Speech normal, gait appropriate for age and unassisted Psych--  Cognition and judgment appear intact.  Cooperative with normal attention span and  concentration.  Behavior appropriate. No anxious or depressed appearing.        Assessment & Plan:    Today , I spent more than 32   min with the patient: >50% of the time counseling regards  plan of care regards the finger infection, pros and cons of referring to ortho,  Also reviewing the chart

## 2015-12-25 ENCOUNTER — Other Ambulatory Visit: Payer: Self-pay | Admitting: Internal Medicine

## 2015-12-25 ENCOUNTER — Encounter: Payer: Self-pay | Admitting: Internal Medicine

## 2015-12-25 ENCOUNTER — Ambulatory Visit (INDEPENDENT_AMBULATORY_CARE_PROVIDER_SITE_OTHER): Payer: BLUE CROSS/BLUE SHIELD | Admitting: Internal Medicine

## 2015-12-25 ENCOUNTER — Telehealth: Payer: Self-pay | Admitting: Internal Medicine

## 2015-12-25 VITALS — BP 122/74 | HR 72 | Temp 97.7°F | Ht 66.5 in | Wt 170.4 lb

## 2015-12-25 DIAGNOSIS — R21 Rash and other nonspecific skin eruption: Secondary | ICD-10-CM

## 2015-12-25 MED ORDER — HYDROCORTISONE 2.5 % EX CREA: TOPICAL_CREAM | Freq: Two times a day (BID) | CUTANEOUS | Status: DC

## 2015-12-25 NOTE — Telephone Encounter (Addendum)
As per 12/25/15 AVS: Front desk: please arrange a CPX sooner than May, ok to put together two 15 min appointments, patient scheduled for 02/06/2016,

## 2015-12-25 NOTE — Progress Notes (Signed)
Pre visit review using our clinic review tool, if applicable. No additional management support is needed unless otherwise documented below in the visit note. 

## 2015-12-25 NOTE — Telephone Encounter (Signed)
Noted  

## 2015-12-25 NOTE — Progress Notes (Signed)
Subjective:    Patient ID: Reginald Parker, male    DOB: 20-Feb-1976, 40 y.o.   MRN: [id number]  DOS:  12/25/2015 Type of visit - description : Acute visit Interval history: 10 days ago developed a penile rash, red in color, no blisters , at the base of glans and distal penis.  Used a OTC cream and is better except for some dryness.    Review of Systems  His male sexual partner has no symptoms, nevertheless went to be checked at her doctor because Kartik's rash, she was tested, results pending. The patient denies any discharge from the penis, dysuria or gross hematuria.  Past Medical History  Diagnosis Date  . Primary sclerosing cholangitis   . Crohn's disease ([place])     universal colitis  . Elevated liver function tests   . Osteoporosis 06/01/2015    Past Surgical History  Procedure Laterality Date  . Nasal sinus surgery  2012    Social History   Social History  . Marital Status: Single    Spouse Name: N/A  . Number of Children: 0  . Years of Education: N/A   Occupational History  . Dealer    Social History Main Topics  . Smoking status: Never Smoker   . Smokeless tobacco: Not on file  . Alcohol Use: No  . Drug Use: No  . Sexual Activity: Not on file   Other Topics Concern  . Not on file   Social History Narrative   Original from [facility]        Medication List       This list is accurate as of: 12/25/15  6:30 PM.  Always use your most recent med list.               cetirizine 10 MG tablet  Commonly known as:  ZYRTEC  Take 10 mg by mouth daily.     fluticasone 50 MCG/ACT nasal spray  Commonly known as:  FLONASE  Place 1 spray into both nostrils daily.     hydrocortisone 2.5 % cream  Apply topically 2 (two) times daily.     LIALDA 1.2 g EC tablet  Generic drug:  mesalamine  Take 4.8 g by mouth daily.     ursodiol 500 MG tablet  Commonly known as:  ACTIGALL  Take 500 mg by mouth 2 (two) times daily.             Objective:   Physical Exam BP 122/74 mmHg  Pulse 72  Temp(Src) 97.7 F (36.5 C) (Oral)  Ht 5' 6.5" (1.689 m)  Wt 170 lb 6 oz (77.282 kg)  BMI 27.09 kg/m2  SpO2 98% General:   Well developed, well nourished . NAD.  HEENT:  Normocephalic . Face symmetric, atraumatic GU: Scrotal contents normal, distal penis and glans skin >>> slightly dry but no ulcers, no blisters. No penile discharge. Neurologic:  alert & oriented X3.  Speech normal, gait appropriate for age and unassisted Psych--  Cognition and judgment appear intact.  Cooperative with normal attention span and concentration.  Behavior appropriate. No anxious or depressed appearing.      Assessment & Plan:   Assessment Crohn's disease, universal colitis, last cscope 09-2014, Dr Michail Sermon Primary sclerosing cholangitis Osteoporosis: d/t steroids, saw endocrine, no meds needed at the time   Plan: Genital rash: Very unlikely to be STD by description, no blisters. Will prescribe hydrocortisone cream and check for G&C. Patient is coming for a CPX, at the time will check HIV  and RPR. Patient to let me know if his male partner has a infection.

## 2015-12-25 NOTE — Patient Instructions (Addendum)
BEFORE YOU LEAVE THE OFFICE:  GO TO THE LAB : provide a urine sample   GO TO [place]  Front desk: please arrange a CPX sooner than May, ok to put together two 15 min appointments     AFTER YOU LEAVE THE OFFICE:  Apply the cream twice a day x 1 week

## 2015-12-26 LAB — GC/CHLAMYDIA PROBE AMP
CT Probe RNA: NOT DETECTED
GC PROBE AMP APTIMA: NOT DETECTED

## 2016-01-17 LAB — HEPATIC FUNCTION PANEL
ALK PHOS: 111 U/L (ref 25–125)
ALT: 41 U/L — AB (ref 10–40)
AST: 25 U/L (ref 14–40)
Bilirubin, Total: 2.1 mg/dL

## 2016-01-17 LAB — CBC AND DIFFERENTIAL
HCT: 50 % (ref 41–53)
HEMOGLOBIN: 17.1 g/dL (ref 13.5–17.5)
Neutrophils Absolute: 3 /uL
Platelets: 227 10*3/uL (ref 150–399)
WBC: 5.9 10^3/mL

## 2016-01-17 LAB — BASIC METABOLIC PANEL
BUN: 11 mg/dL (ref 4–21)
CREATININE: 0.9 mg/dL (ref 0.6–1.3)
Glucose: 84 mg/dL
Potassium: 5.1 mmol/L (ref 3.4–5.3)
SODIUM: 139 mmol/L (ref 137–147)

## 2016-01-21 ENCOUNTER — Encounter: Payer: Self-pay | Admitting: Internal Medicine

## 2016-02-05 ENCOUNTER — Telehealth: Payer: Self-pay | Admitting: Behavioral Health

## 2016-02-05 NOTE — Telephone Encounter (Signed)
Unable to reach patient for Pre-Visit Call. Left message for patient to return call when available.

## 2016-02-06 ENCOUNTER — Encounter: Payer: Self-pay | Admitting: Internal Medicine

## 2016-02-06 ENCOUNTER — Ambulatory Visit (INDEPENDENT_AMBULATORY_CARE_PROVIDER_SITE_OTHER): Payer: BLUE CROSS/BLUE SHIELD | Admitting: Internal Medicine

## 2016-02-06 VITALS — BP 118/70 | HR 62 | Temp 98.2°F | Ht 66.0 in | Wt 173.0 lb

## 2016-02-06 DIAGNOSIS — Z23 Encounter for immunization: Secondary | ICD-10-CM

## 2016-02-06 DIAGNOSIS — Z0000 Encounter for general adult medical examination without abnormal findings: Secondary | ICD-10-CM

## 2016-02-06 DIAGNOSIS — Z09 Encounter for follow-up examination after completed treatment for conditions other than malignant neoplasm: Secondary | ICD-10-CM

## 2016-02-06 DIAGNOSIS — Z114 Encounter for screening for human immunodeficiency virus [HIV]: Secondary | ICD-10-CM

## 2016-02-06 MED ORDER — ALBUTEROL SULFATE HFA 108 (90 BASE) MCG/ACT IN AERS: 2.0000 | INHALATION_SPRAY | Freq: Four times a day (QID) | RESPIRATORY_TRACT | Status: DC | PRN

## 2016-02-06 MED ORDER — MONTELUKAST SODIUM 10 MG PO TABS: 10.0000 mg | ORAL_TABLET | Freq: Every day | ORAL | Status: DC

## 2016-02-06 NOTE — Progress Notes (Signed)
Subjective:    Patient ID: Reginald Parker, male    DOB: 1976/04/05, 40 y.o.   MRN: [id number]  DOS:  02/06/2016 Type of visit - description :  CPX Interval history: in general feeling well    Review of Systems  Constitutional: No fever. No chills. No unexplained wt changes. No unusual sweats  HEENT: No dental problems, no ear discharge, no facial swelling, no voice changes. No eye discharge, no eye  redness , no  intolerance to light   Respiratory:  When the pollen is high, he reports allergies >>>, sneezing, coughing, occasional wheezig. He takes Zyrtec which helps.   no  difficulty breathing.  Cardiovascular: No CP, no leg swelling , no  Palpitations  GI: no nausea, no vomiting, no diarrhea , no  abdominal pain.  No blood in the stools. No dysphagia, no odynophagia    Endocrine: No polyphagia, no polyuria , no polydipsia  GU: No dysuria, gross hematuria, difficulty urinating. No urinary urgency, no frequency.  Musculoskeletal: No joint swellings or unusual aches or pains  Skin: No change in the color of the skin, palor , no  Rash  Allergic, immunologic:   no  food allergies  Neurological: No dizziness no  syncope. No headaches. No diplopia, no slurred, no slurred speech, no motor deficits, no facial  Numbness  Hematological: No enlarged lymph nodes, no easy bruising , no unusual bleedings  Psychiatry: No suicidal ideas, no hallucinations, no beavior problems, no confusion.  No unusual/severe anxiety, no depression  Past Medical History  Diagnosis Date  . Primary sclerosing cholangitis   . Crohn's disease ([place])     universal colitis  . Elevated liver function tests   . Osteoporosis 06/01/2015  . Cholangitis     Past Surgical History  Procedure Laterality Date  . Nasal sinus surgery  2012    Social History   Social History  . Marital Status: Single    Spouse Name: N/A  . Number of Children: 0  . Years of Education: N/A   Occupational History  .  Dealer    Social History Main Topics  . Smoking status: Never Smoker   . Smokeless tobacco: Not on file  . Alcohol Use: 0.0 oz/week    0 Standard drinks or equivalent per week     Comment: rarely has a beer  . Drug Use: No  . Sexual Activity: Not on file   Other Topics Concern  . Not on file   Social History Narrative   Original from [place] w/ brother      Family History  Problem Relation Age of Onset  . Colon cancer Neg Hx   . Prostate cancer Neg Hx   . Diabetes Neg Hx   . CAD Neg Hx        Medication List       This list is accurate as of: 02/06/16 11:59 PM.  Always use your most recent med list.               albuterol 108 (90 Base) MCG/ACT inhaler  Commonly known as:  VENTOLIN HFA  Inhale 2 puffs into the lungs every 6 (six) hours as needed for wheezing or shortness of breath.     cetirizine 10 MG tablet  Commonly known as:  ZYRTEC  Take 10 mg by mouth daily.     fluticasone 50 MCG/ACT nasal spray  Commonly known as:  FLONASE  Place 1 spray into both nostrils daily.  LIALDA 1.2 g EC tablet  Generic drug:  mesalamine  Take 4.8 g by mouth daily.     montelukast 10 MG tablet  Commonly known as:  SINGULAIR  Take 1 tablet (10 mg total) by mouth at bedtime.     ursodiol 500 MG tablet  Commonly known as:  ACTIGALL  Take 500 mg by mouth 2 (two) times daily.           Objective:   Physical Exam BP 118/70 mmHg  Pulse 62  Temp(Src) 98.2 F (36.8 C) (Oral)  Ht 5' 6"  (1.676 m)  Wt 173 lb (78.472 kg)  BMI 27.94 kg/m2  SpO2 98% General:   Well developed, well nourished . NAD.  Neck: No  thyromegaly , normal carotid pulse HEENT:  Normocephalic . Face symmetric, atraumatic Lungs:  CTA B Normal respiratory effort, no intercostal retractions, no accessory muscle use. Heart: RRR,  no murmur.  No pretibial edema bilaterally  Abdomen:  Not distended, soft, non-tender. No rebound or rigidity.   Skin: Exposed areas without  rash. Not pale. Not jaundice Neurologic:  alert & oriented X3.  Speech normal, gait appropriate for age and unassisted Strength symmetric and appropriate for age.  Psych: Cognition and judgment appear intact.  Cooperative with normal attention span and concentration.  Behavior appropriate. No anxious or depressed appearing.     Assessment & Plan:   Assessment Crohn's disease, universal colitis, last cscope 09-2014, Dr Michail Sermon Primary sclerosing cholangitis Osteoporosis: d/t steroids, saw endocrine>> no Rx unless has a fx or needs ongoing steroids Allergies, B-spasm (saw allergist remotely, was rx qvar)  Plan: Genital rash: See previous visit, resolved. Allergies, bronchospasm: Recommend Zyrtec, Singulair and albuterol as needed during the allergy season. Call if symptoms not well-controlled. RTC 1 year

## 2016-02-06 NOTE — Patient Instructions (Addendum)
  GO TO THE FRONT DESK Schedule labs to be done tomorrow or within few days Schedule your next appointment for a  Physical exam When?   1 year Fasting?  Yes   During allergy season: Takes Zyrtec daily Take Singulair daily Take albuterol 2 puffs 4  times a day if wheezing.

## 2016-02-06 NOTE — Assessment & Plan Note (Addendum)
Td today, PNM 23 today, rec flu shot q September  Colon cancer screening -- per GI Prostate cancer screening: Not indicated Diet and exercise discussed Labs: BMP, FLP, O17, folic acid, vitamin D.TSH, HIV.

## 2016-02-06 NOTE — Progress Notes (Signed)
Pre visit review using our clinic review tool, if applicable. No additional management support is needed unless otherwise documented below in the visit note. 

## 2016-02-07 DIAGNOSIS — Z09 Encounter for follow-up examination after completed treatment for conditions other than malignant neoplasm: Secondary | ICD-10-CM | POA: Insufficient documentation

## 2016-02-07 NOTE — Assessment & Plan Note (Signed)
Genital rash: See previous visit, resolved. Allergies, bronchospasm: Recommend Zyrtec, Singulair and albuterol as needed during the allergy season. Call if symptoms not well-controlled. RTC 1 year

## 2016-02-08 ENCOUNTER — Other Ambulatory Visit (INDEPENDENT_AMBULATORY_CARE_PROVIDER_SITE_OTHER): Payer: BLUE CROSS/BLUE SHIELD

## 2016-02-08 DIAGNOSIS — Z0000 Encounter for general adult medical examination without abnormal findings: Secondary | ICD-10-CM | POA: Diagnosis not present

## 2016-02-08 DIAGNOSIS — Z114 Encounter for screening for human immunodeficiency virus [HIV]: Secondary | ICD-10-CM

## 2016-02-08 LAB — BASIC METABOLIC PANEL
BUN: 11 mg/dL (ref 6–23)
CO2: 31 mEq/L (ref 19–32)
Calcium: 9.8 mg/dL (ref 8.4–10.5)
Chloride: 101 mEq/L (ref 96–112)
Creatinine, Ser: 0.9 mg/dL (ref 0.40–1.50)
GFR: 99.19 mL/min (ref >60.00)
Glucose, Bld: 95 mg/dL (ref 70–99)
POTASSIUM: 4.5 meq/L (ref 3.5–5.1)
SODIUM: 138 meq/L (ref 135–145)

## 2016-02-08 LAB — FOLATE: Folate: 15.8 ng/mL (ref >5.9)

## 2016-02-08 LAB — HIV ANTIBODY (ROUTINE TESTING W REFLEX): HIV: NONREACTIVE

## 2016-02-08 LAB — LIPID PANEL
CHOLESTEROL: 160 mg/dL (ref 0–200)
HDL: 36.7 mg/dL — ABNORMAL LOW (ref >39.00)
LDL Cholesterol: 92 mg/dL (ref 0–99)
NonHDL: 122.93
Total CHOL/HDL Ratio: 4
Triglycerides: 155 mg/dL — ABNORMAL HIGH (ref 0.0–149.0)
VLDL: 31 mg/dL (ref 0.0–40.0)

## 2016-02-08 LAB — VITAMIN B12: VITAMIN B 12: 630 pg/mL (ref 211–911)

## 2016-02-08 LAB — TSH: TSH: 0.77 u[IU]/mL (ref 0.35–4.50)

## 2016-02-11 LAB — VITAMIN D 1,25 DIHYDROXY
Vitamin D 1, 25 (OH)2 Total: 43 pg/mL (ref 18–72)
Vitamin D3 1, 25 (OH)2: 43 pg/mL

## 2016-03-24 ENCOUNTER — Encounter: Payer: BLUE CROSS/BLUE SHIELD | Admitting: Internal Medicine

## 2016-09-04 DIAGNOSIS — K5100 Ulcerative (chronic) pancolitis without complications: Secondary | ICD-10-CM | POA: Diagnosis not present

## 2016-09-04 DIAGNOSIS — K830 Cholangitis: Secondary | ICD-10-CM | POA: Diagnosis not present

## 2016-09-04 LAB — CBC AND DIFFERENTIAL
HEMATOCRIT: 50 % (ref 41–53)
HEMOGLOBIN: 17.1 g/dL (ref 13.5–17.5)
Neutrophils Absolute: 3 /uL
Platelets: 226 10*3/uL (ref 150–399)
WBC: 5.8 10^3/mL

## 2016-09-04 LAB — BASIC METABOLIC PANEL
BUN: 10 mg/dL (ref 4–21)
Creatinine: 0.8 mg/dL (ref 0.6–1.3)
Glucose: 80 mg/dL
POTASSIUM: 4.3 mmol/L (ref 3.4–5.3)
SODIUM: 140 mmol/L (ref 137–147)

## 2016-09-04 LAB — HEPATIC FUNCTION PANEL
ALK PHOS: 93 U/L (ref 25–125)
ALT: 56 U/L — AB (ref 10–40)
AST: 32 U/L (ref 14–40)
Bilirubin, Total: 1.8 mg/dL

## 2016-09-10 ENCOUNTER — Encounter: Payer: Self-pay | Admitting: Internal Medicine

## 2016-12-19 DIAGNOSIS — K830 Cholangitis: Secondary | ICD-10-CM | POA: Diagnosis not present

## 2017-02-09 ENCOUNTER — Ambulatory Visit (INDEPENDENT_AMBULATORY_CARE_PROVIDER_SITE_OTHER): Payer: BLUE CROSS/BLUE SHIELD | Admitting: Internal Medicine

## 2017-02-09 ENCOUNTER — Encounter: Payer: Self-pay | Admitting: Internal Medicine

## 2017-02-09 DIAGNOSIS — Z0000 Encounter for general adult medical examination without abnormal findings: Secondary | ICD-10-CM | POA: Diagnosis not present

## 2017-02-09 LAB — LIPID PANEL
CHOL/HDL RATIO: 4
CHOLESTEROL: 173 mg/dL (ref 0–200)
HDL: 45.9 mg/dL (ref >39.00)
LDL Cholesterol: 102 mg/dL — ABNORMAL HIGH (ref 0–99)
NonHDL: 127.53
TRIGLYCERIDES: 127 mg/dL (ref 0.0–149.0)
VLDL: 25.4 mg/dL (ref 0.0–40.0)

## 2017-02-09 NOTE — Progress Notes (Signed)
Pre visit review using our clinic review tool, if applicable. No additional management support is needed unless otherwise documented below in the visit note. 

## 2017-02-09 NOTE — Assessment & Plan Note (Addendum)
Td 2017;  PNM 23 2017, rec flu shot q September  Colon cancer screening -- per GI Prostate cancer screening: Not indicated Diet and exercise discussed although he reports is doing well  Labs from October reviewed, will check a FLP

## 2017-02-09 NOTE — Patient Instructions (Signed)
GO TO THE LAB : Get the blood work     GO TO THE FRONT DESK Schedule your next appointment for a  Physical in 1 year, fasting  Use OTC hydrocortisone 1% as needed for rash at the neck

## 2017-02-09 NOTE — Progress Notes (Signed)
Subjective:    Patient ID: Reginald Parker, male    DOB: September 03, 1976, 41 y.o.   MRN: [id number]  DOS:  02/09/2017 Type of visit - description : CPX Interval history: doing well     Review of Systems Feeling well. He remains active at work and is trying to eat healthier. Allergies  currently well controlled History of Crohn's disease, symptoms controlled. Occasionally has a rash, at the neck after shaving, usually in the summer Other than above, a 14 point review of systems is negative      Past Medical History:  Diagnosis Date  . Crohn's disease ([place])    universal colitis  . Elevated liver function tests   . Osteoporosis 06/01/2015  . Primary sclerosing cholangitis     Past Surgical History:  Procedure Laterality Date  . NASAL SINUS SURGERY  2012    Social History   Social History  . Marital status: Single    Spouse name: N/A  . Number of children: 0  . Years of education: N/A   Occupational History  . Dealer    Social History Main Topics  . Smoking status: Never Smoker  . Smokeless tobacco: Never Used  . Alcohol use 0.0 oz/week     Comment: rarely has a beer  . Drug use: No  . Sexual activity: Not on file   Other Topics Concern  . Not on file   Social History Narrative   Original from [place] w/ girlfriend, expecting twins     Family History  Problem Relation Age of Onset  . Colon cancer Neg Hx   . Prostate cancer Neg Hx   . Diabetes Neg Hx   . CAD Neg Hx     Allergies as of 02/09/2017      Reactions   Ibuprofen Other (See Comments)   Causes Heartburn      Medication List       Accurate as of 02/09/17  6:35 PM. Always use your most recent med list.          albuterol 108 (90 Base) MCG/ACT inhaler Commonly known as:  VENTOLIN HFA Inhale 2 puffs into the lungs every 6 (six) hours as needed for wheezing or shortness of breath.   cetirizine 10 MG tablet Commonly known as:  ZYRTEC Take 10 mg by mouth daily.     fluticasone 50 MCG/ACT nasal spray Commonly known as:  FLONASE Place 1 spray into both nostrils daily.   LIALDA 1.2 g EC tablet Generic drug:  mesalamine Take 4.8 g by mouth daily.   ursodiol 500 MG tablet Commonly known as:  ACTIGALL Take 500 mg by mouth 2 (two) times daily.          Objective:   Physical Exam BP 118/68 (BP Location: Left Arm, Patient Position: Sitting, Cuff Size: Normal)   Pulse 66   Temp 97.8 F (36.6 C) (Oral)   Resp 14   Ht 5' 6"  (1.676 m)   Wt 188 lb (85.3 kg)   SpO2 98%   BMI 30.34 kg/m  General:   Well developed, well nourished . NAD.  Neck: No  thyromegaly  HEENT:  Normocephalic . Face symmetric, atraumatic Lungs:  CTA B Normal respiratory effort, no intercostal retractions, no accessory muscle use. Heart: RRR,  no murmur.  No pretibial edema bilaterally  Abdomen:  Not distended, soft, non-tender. No rebound or rigidity.   Skin: Exposed areas without rash. Not pale. Not jaundice Neurologic:  alert & oriented X3.  Speech normal, gait appropriate for age and unassisted Strength symmetric and appropriate for age.  Psych: Cognition and judgment appear intact.  Cooperative with normal attention span and concentration.  Behavior appropriate. No anxious or depressed appearing.     Assessment & Plan:   Assessment Crohn's disease, universal colitis, last cscope 09-2014, Dr Michail Sermon Primary sclerosing cholangitis Osteoporosis: d/t steroids, saw endocrine>> no Rx unless has a fx or needs ongoing steroids Allergies, B-spasm (saw allergist remotely, was rx qvar)  Plan: Crohn's disease,PSC:  Per GI, last  labs reviewed, essentially asx at doing well. Allergies: Currently well controlled Rash, neck: Keep the area dry from sweating, hydrocortisone cream OTC as needed RTC one year

## 2017-02-09 NOTE — Assessment & Plan Note (Signed)
Crohn's disease,PSC:  Per GI, last  labs reviewed, essentially asx at doing well. Allergies: Currently well controlled Rash, neck: Keep the area dry from sweating, hydrocortisone cream OTC as needed RTC one year

## 2017-08-27 DIAGNOSIS — K8301 Primary sclerosing cholangitis: Secondary | ICD-10-CM | POA: Diagnosis not present

## 2017-08-27 DIAGNOSIS — K5100 Ulcerative (chronic) pancolitis without complications: Secondary | ICD-10-CM | POA: Diagnosis not present

## 2017-08-27 LAB — HEPATIC FUNCTION PANEL
ALT: 50 — AB (ref 10–40)
AST: 28 (ref 14–40)
Alkaline Phosphatase: 187 — AB (ref 25–125)

## 2017-08-27 LAB — BASIC METABOLIC PANEL
BUN: 11 (ref 4–21)
Creatinine: 0.9 (ref 0.6–1.3)
GLUCOSE: 84
POTASSIUM: 4.2 (ref 3.4–5.3)
Sodium: 140 (ref 137–147)

## 2017-09-04 ENCOUNTER — Encounter: Payer: Self-pay | Admitting: Internal Medicine

## 2018-02-02 DIAGNOSIS — R6889 Other general symptoms and signs: Secondary | ICD-10-CM | POA: Diagnosis not present

## 2018-02-02 DIAGNOSIS — R509 Fever, unspecified: Secondary | ICD-10-CM | POA: Diagnosis not present

## 2018-02-11 ENCOUNTER — Encounter: Payer: BLUE CROSS/BLUE SHIELD | Admitting: Internal Medicine

## 2018-03-03 DIAGNOSIS — K8301 Primary sclerosing cholangitis: Secondary | ICD-10-CM | POA: Diagnosis not present

## 2018-03-03 DIAGNOSIS — K5100 Ulcerative (chronic) pancolitis without complications: Secondary | ICD-10-CM | POA: Diagnosis not present

## 2018-03-03 LAB — BASIC METABOLIC PANEL
BUN: 10 (ref 4–21)
CREATININE: 0.8 (ref 0.6–1.3)
GLUCOSE: 80
Potassium: 4.5 (ref 3.4–5.3)
Sodium: 140 (ref 137–147)

## 2018-03-03 LAB — HEPATIC FUNCTION PANEL
ALT: 54 — AB (ref 10–40)
AST: 23 (ref 14–40)
Alkaline Phosphatase: 108 (ref 25–125)
BILIRUBIN, TOTAL: 1.6

## 2018-03-03 LAB — CBC AND DIFFERENTIAL
HCT: 47 (ref 41–53)
Hemoglobin: 15.8 (ref 13.5–17.5)
NEUTROS ABS: 3
PLATELETS: 213 (ref 150–399)
WBC: 6.4

## 2018-03-08 ENCOUNTER — Encounter: Payer: Self-pay | Admitting: Internal Medicine

## 2018-05-26 ENCOUNTER — Encounter: Payer: Self-pay | Admitting: Internal Medicine

## 2018-05-26 ENCOUNTER — Ambulatory Visit (INDEPENDENT_AMBULATORY_CARE_PROVIDER_SITE_OTHER): Payer: BLUE CROSS/BLUE SHIELD | Admitting: Internal Medicine

## 2018-05-26 VITALS — BP 126/64 | HR 59 | Temp 98.0°F | Resp 16 | Ht 63.0 in | Wt 185.0 lb

## 2018-05-26 DIAGNOSIS — Z0000 Encounter for general adult medical examination without abnormal findings: Secondary | ICD-10-CM | POA: Diagnosis not present

## 2018-05-26 LAB — LIPID PANEL
CHOLESTEROL: 155 mg/dL (ref 0–200)
HDL: 39.2 mg/dL (ref >39.00)
LDL Cholesterol: 94 mg/dL (ref 0–99)
NonHDL: 115.76
Total CHOL/HDL Ratio: 4
Triglycerides: 111 mg/dL (ref 0.0–149.0)
VLDL: 22.2 mg/dL (ref 0.0–40.0)

## 2018-05-26 LAB — TSH: TSH: 1.31 u[IU]/mL (ref 0.35–4.50)

## 2018-05-26 MED ORDER — AZELASTINE HCL 0.1 % NA SOLN: 2.0000 | Freq: Two times a day (BID) | NASAL | 6 refills | Status: DC

## 2018-05-26 MED ORDER — ALBUTEROL SULFATE HFA 108 (90 BASE) MCG/ACT IN AERS: 2.0000 | INHALATION_SPRAY | Freq: Four times a day (QID) | RESPIRATORY_TRACT | 2 refills | Status: DC | PRN

## 2018-05-26 NOTE — Assessment & Plan Note (Addendum)
Td 2017;  PNM 23 2017  Colon cancer screening -- per GI Prostate cancer screening: Not indicated Diet and exercise: active at work, room for improvement labbs reviewed, due for a FLP TSH

## 2018-05-26 NOTE — Progress Notes (Signed)
Pre visit review using our clinic review tool, if applicable. No additional management support is needed unless otherwise documented below in the visit note. 

## 2018-05-26 NOTE — Patient Instructions (Signed)
GO TO THE LAB : Get the blood work     GO TO THE FRONT DESK Schedule your next appointment for a physical exam in 1 year   Allergies: Zyrtec Use OTC  Flonase : 2 nasal sprays on each side of the nose in the morning until you feel better Use ASTELIN a prescribed spray : 2 nasal sprays on each side of the nose twice a day until you feel better

## 2018-05-26 NOTE — Assessment & Plan Note (Signed)
Crohn's disease,PSC: Per GI, good med compliance. History of osteoporosis: Not on recent steroids. Allergies, bronchospasm: Allergy symptoms resurface a week ago, in addition to see her take recommend Flonase and Astelin on a regular basis until better Chest pain: Single episode in December, no worrisome symptoms, call if symptoms resurface. RTC 1 year

## 2018-05-26 NOTE — Progress Notes (Signed)
Subjective:    Patient ID: Reginald Parker, male    DOB: 1976-06-23, 42 y.o.   MRN: [id number]  DOS:  05/26/2018 Type of visit - description : Physical exam Interval history: In general feeling well   Review of Systems  Allergies have returned for the last week: Nasal congestion, sneezing, taking Zyrtec.  Request a refill on albuterol just in case. Back in December 2018 had a single episode of pain at the left upper chest and left shoulder.  Symptoms lasted a day, they were no associated fever, chills, cough, chest pain, neck pain.  Other than above, a 14 point review of systems is negative     Past Medical History:  Diagnosis Date  . Crohn's disease ([place])    universal colitis  . Elevated liver function tests   . Osteoporosis 06/01/2015  . Primary sclerosing cholangitis     Past Surgical History:  Procedure Laterality Date  . NASAL SINUS SURGERY  2012    Social History   Socioeconomic History  . Marital status: Significant Other    Spouse name: Not on file  . Number of children: 2  . Years of education: Not on file  . Highest education level: Not on file  Occupational History  . Occupation: Dealer  Social Needs  . Financial resource strain: Not on file  . Food insecurity:    Worry: Not on file    Inability: Not on file  . Transportation needs:    Medical: Not on file    Non-medical: Not on file  Tobacco Use  . Smoking status: Never Smoker  . Smokeless tobacco: Never Used  Substance and Sexual Activity  . Alcohol use: Yes    Alcohol/week: 0.0 oz    Comment: rarely has a beer  . Drug use: No  . Sexual activity: Not on file  Lifestyle  . Physical activity:    Days per week: Not on file    Minutes per session: Not on file  . Stress: Not on file  Relationships  . Social connections:    Talks on phone: Not on file    Gets together: Not on file    Attends religious service: Not on file    Active member of club or organization: Not on file    Attends  meetings of clubs or organizations: Not on file    Relationship status: Not on file  . Intimate partner violence:    Fear of current or ex partner: Not on file    Emotionally abused: Not on file    Physically abused: Not on file    Forced sexual activity: Not on file  Other Topics Concern  . Not on file  Social History Narrative   Original from [place] w/ girlfriend   Ttwins 2018     Family History  Problem Relation Age of Onset  . Colon cancer Neg Hx   . Prostate cancer Neg Hx   . Diabetes Neg Hx   . CAD Neg Hx      Allergies as of 05/26/2018      Reactions   Ibuprofen Other (See Comments)   Causes Heartburn      Medication List        Accurate as of 05/26/18  6:04 PM. Always use your most recent med list.          albuterol 108 (90 Base) MCG/ACT inhaler Commonly known as:  VENTOLIN HFA Inhale 2 puffs into the lungs every 6 (six)  hours as needed for wheezing or shortness of breath.   azelastine 0.1 % nasal spray Commonly known as:  ASTELIN Place 2 sprays into both nostrils 2 (two) times daily.   cetirizine 10 MG tablet Commonly known as:  ZYRTEC Take 10 mg by mouth daily.   fluticasone 50 MCG/ACT nasal spray Commonly known as:  FLONASE Place 1 spray into both nostrils daily.   LIALDA 1.2 g EC tablet Generic drug:  mesalamine Take 4.8 g by mouth daily.   ursodiol 500 MG tablet Commonly known as:  ACTIGALL Take 500 mg by mouth 2 (two) times daily.          Objective:   Physical Exam BP 126/64 (BP Location: Left Arm, Patient Position: Sitting, Cuff Size: Small)   Pulse (!) 59   Temp 98 F (36.7 C) (Oral)   Resp 16   Ht 5' 3"  (1.6 m)   Wt 185 lb (83.9 kg)   SpO2 98%   BMI 32.77 kg/m  General: Well developed, NAD, see BMI.  Neck: No  thyromegaly  HEENT:  Normocephalic . Face symmetric, atraumatic Lungs:  CTA B Normal respiratory effort, no intercostal retractions, no accessory muscle use. Heart: RRR,  no murmur.  No  pretibial edema bilaterally  Abdomen:  Not distended, soft, non-tender. No rebound or rigidity.   Skin: Exposed areas without rash. Not pale. Not jaundice Neurologic:  alert & oriented X3.  Speech normal, gait appropriate for age and unassisted Strength symmetric and appropriate for age.  Psych: Cognition and judgment appear intact.  Cooperative with normal attention span and concentration.  Behavior appropriate. No anxious or depressed appearing.     Assessment & Plan:       Assessment Crohn's disease, universal colitis, last cscope 09-2014, Dr Michail Sermon Primary sclerosing cholangitis Osteoporosis: d/t steroids, saw endocrine>> no Rx unless has a fx or needs ongoing steroids Allergies, B-spasm (saw allergist remotely, was rx qvar)  Plan: Crohn's disease,PSC: Per GI, good med compliance. History of osteoporosis: Not on recent steroids. Allergies, bronchospasm: Allergy symptoms resurface a week ago, in addition to see her take recommend Flonase and Astelin on a regular basis until better Chest pain: Single episode in December, no worrisome symptoms, call if symptoms resurface. RTC 1 year

## 2018-09-23 DIAGNOSIS — K5100 Ulcerative (chronic) pancolitis without complications: Secondary | ICD-10-CM | POA: Diagnosis not present

## 2018-09-23 DIAGNOSIS — K8301 Primary sclerosing cholangitis: Secondary | ICD-10-CM | POA: Diagnosis not present

## 2018-11-07 DIAGNOSIS — J188 Other pneumonia, unspecified organism: Secondary | ICD-10-CM | POA: Diagnosis not present

## 2018-11-10 ENCOUNTER — Other Ambulatory Visit: Payer: Self-pay | Admitting: Internal Medicine

## 2018-11-12 ENCOUNTER — Telehealth: Payer: Self-pay | Admitting: Internal Medicine

## 2018-11-12 NOTE — Telephone Encounter (Signed)
Was seen at another location 11/07/2018, diagnosed with pneumonia, recommend patient to follow-up here in 2 to 3 weeks, sooner if he is not improving.

## 2018-11-15 NOTE — Telephone Encounter (Signed)
Pt is scheduled for 12-06-2017 at 9:40 am for fu on pneumonia. Done

## 2018-11-15 NOTE — Telephone Encounter (Signed)
Needs follow-up in 2-3 weeks for pneumonia follow-up.

## 2018-12-06 ENCOUNTER — Ambulatory Visit: Payer: BLUE CROSS/BLUE SHIELD | Admitting: Internal Medicine

## 2018-12-06 ENCOUNTER — Ambulatory Visit (HOSPITAL_BASED_OUTPATIENT_CLINIC_OR_DEPARTMENT_OTHER)
Admission: RE | Admit: 2018-12-06 | Discharge: 2018-12-06 | Disposition: A | Discharge status: Home / Self Care | Payer: BLUE CROSS/BLUE SHIELD | Source: Ambulatory Visit | Attending: Internal Medicine | Admitting: Internal Medicine

## 2018-12-06 ENCOUNTER — Encounter: Payer: Self-pay | Admitting: Internal Medicine

## 2018-12-06 VITALS — BP 108/74 | HR 61 | Temp 98.1°F | Resp 16 | Ht 63.0 in | Wt 181.4 lb

## 2018-12-06 DIAGNOSIS — J189 Pneumonia, unspecified organism: Secondary | ICD-10-CM | POA: Insufficient documentation

## 2018-12-06 DIAGNOSIS — I493 Ventricular premature depolarization: Secondary | ICD-10-CM

## 2018-12-06 DIAGNOSIS — I499 Cardiac arrhythmia, unspecified: Secondary | ICD-10-CM

## 2018-12-06 DIAGNOSIS — Z23 Encounter for immunization: Secondary | ICD-10-CM

## 2018-12-06 LAB — COMPREHENSIVE METABOLIC PANEL
ALK PHOS: 150 U/L — AB (ref 39–117)
ALT: 107 U/L — AB (ref 0–53)
AST: 86 U/L — AB (ref 0–37)
Albumin: 4.4 g/dL (ref 3.5–5.2)
BILIRUBIN TOTAL: 1.6 mg/dL — AB (ref 0.2–1.2)
BUN: 9 mg/dL (ref 6–23)
CO2: 31 meq/L (ref 19–32)
Calcium: 9.7 mg/dL (ref 8.4–10.5)
Chloride: 102 mEq/L (ref 96–112)
Creatinine, Ser: 0.88 mg/dL (ref 0.40–1.50)
GFR: 100.4 mL/min (ref >60.00)
Glucose, Bld: 104 mg/dL — ABNORMAL HIGH (ref 70–99)
Potassium: 4.2 mEq/L (ref 3.5–5.1)
SODIUM: 137 meq/L (ref 135–145)
TOTAL PROTEIN: 8 g/dL (ref 6.0–8.3)

## 2018-12-06 LAB — CBC WITH DIFFERENTIAL/PLATELET
BASOS ABS: 0 10*3/uL (ref 0.0–0.1)
Basophils Relative: 0.6 % (ref 0.0–3.0)
Eosinophils Absolute: 0.2 10*3/uL (ref 0.0–0.7)
Eosinophils Relative: 3 % (ref 0.0–5.0)
HCT: 45.2 % (ref 39.0–52.0)
Hemoglobin: 15.5 g/dL (ref 13.0–17.0)
LYMPHS ABS: 2.3 10*3/uL (ref 0.7–4.0)
Lymphocytes Relative: 42.2 % (ref 12.0–46.0)
MCHC: 34.3 g/dL (ref 30.0–36.0)
MCV: 91.9 fl (ref 78.0–100.0)
MONO ABS: 0.6 10*3/uL (ref 0.1–1.0)
Monocytes Relative: 11.3 % (ref 3.0–12.0)
NEUTROS ABS: 2.4 10*3/uL (ref 1.4–7.7)
NEUTROS PCT: 42.9 % — AB (ref 43.0–77.0)
PLATELETS: 219 10*3/uL (ref 150.0–400.0)
RBC: 4.92 Mil/uL (ref 4.22–5.81)
RDW: 13.3 % (ref 11.5–15.5)
WBC: 5.5 10*3/uL (ref 4.0–10.5)

## 2018-12-06 LAB — MAGNESIUM: MAGNESIUM: 2 mg/dL (ref 1.5–2.5)

## 2018-12-06 NOTE — Progress Notes (Signed)
Pre visit review using our clinic review tool, if applicable. No additional management support is needed unless otherwise documented below in the visit note. 

## 2018-12-06 NOTE — Patient Instructions (Signed)
GO TO THE LAB : Get the blood work     GO TO THE FRONT DESK Schedule your next appointment for a checkup in 2 months    STOP BY THE FIRST FLOOR:  get the XR   We will get a Holter monitor for frequent PVCs

## 2018-12-06 NOTE — Progress Notes (Signed)
Subjective:    Patient ID: Reginald Parker, male    DOB: 1976/01/17, 43 y.o.   MRN: [id number]  DOS:  12/06/2018 Type of visit - description: f/4 dx w/ pneumonia ~ 11/07/18 @ a UC He developed sore throat, cough and wheezing.  Was prescribed antibiotics for 10 days and now is essentially back to baseline.  On exam, heart rate noted to be slightly irregular.     Review of Systems Denies fever chills No nausea, vomiting, diarrhea Denies chest pain, DOE, shortness of breath, palpitations, lower extremity edema.  No syncope  Past Medical History:  Diagnosis Date  . Crohn's disease ([place])    universal colitis  . Elevated liver function tests   . Osteoporosis 06/01/2015  . Primary sclerosing cholangitis     Past Surgical History:  Procedure Laterality Date  . NASAL SINUS SURGERY  2012    Social History   Socioeconomic History  . Marital status: Significant Other    Spouse name: Not on file  . Number of children: 2  . Years of education: Not on file  . Highest education level: Not on file  Occupational History  . Occupation: Dealer  Social Needs  . Financial resource strain: Not on file  . Food insecurity:    Worry: Not on file    Inability: Not on file  . Transportation needs:    Medical: Not on file    Non-medical: Not on file  Tobacco Use  . Smoking status: Never Smoker  . Smokeless tobacco: Never Used  Substance and Sexual Activity  . Alcohol use: Yes    Alcohol/week: 0.0 standard drinks    Comment: rarely has a beer  . Drug use: No  . Sexual activity: Not on file  Lifestyle  . Physical activity:    Days per week: Not on file    Minutes per session: Not on file  . Stress: Not on file  Relationships  . Social connections:    Talks on phone: Not on file    Gets together: Not on file    Attends religious service: Not on file    Active member of club or organization: Not on file    Attends meetings of clubs or organizations: Not on file    Relationship  status: Not on file  . Intimate partner violence:    Fear of current or ex partner: Not on file    Emotionally abused: Not on file    Physically abused: Not on file    Forced sexual activity: Not on file  Other Topics Concern  . Not on file  Social History Narrative   Original from [place] w/ girlfriend   Ttwins 2018      Allergies as of 12/06/2018      Reactions   Ibuprofen Other (See Comments)   Causes Heartburn      Medication List       Accurate as of December 06, 2018 11:59 PM. Always use your most recent med list.        albuterol 108 (90 Base) MCG/ACT inhaler Commonly known as:  VENTOLIN HFA Inhale 2 puffs into the lungs every 6 (six) hours as needed for wheezing or shortness of breath.   azelastine 0.1 % nasal spray Commonly known as:  ASTELIN Place 2 sprays into both nostrils 2 (two) times daily.   cetirizine 10 MG tablet Commonly known as:  ZYRTEC Take 10 mg by mouth daily.   fluticasone 50 MCG/ACT nasal spray  Commonly known as:  FLONASE Place 1 spray into both nostrils daily.   LIALDA 1.2 g EC tablet Generic drug:  mesalamine Take 4.8 g by mouth daily.   ursodiol 500 MG tablet Commonly known as:  ACTIGALL Take 500 mg by mouth 2 (two) times daily.           Objective:   Physical Exam BP 108/74 (BP Location: Left Arm, Patient Position: Sitting, Cuff Size: Normal)   Pulse 61   Temp 98.1 F (36.7 C) (Oral)   Resp 16   Ht 5' 3"  (1.6 m)   Wt 181 lb 6 oz (82.3 kg)   SpO2 98%   BMI 32.13 kg/m  General:   Well developed, NAD, BMI noted. HEENT:  Normocephalic . Face symmetric, atraumatic Lungs:  CTA B Normal respiratory effort, no intercostal retractions, no accessory muscle use. Heart: Frequent irregular beats?,  no murmur.  No pretibial edema bilaterally  Skin: Not pale. Not jaundice Neurologic:  alert & oriented X3.  Speech normal, gait appropriate for age and unassisted Psych--  Cognition and judgment appear  intact.  Cooperative with normal attention span and concentration.  Behavior appropriate. No anxious or depressed appearing.      Assessment      Assessment Crohn's disease, universal colitis, last cscope 09-2014, Dr Michail Sermon Primary sclerosing cholangitis Osteoporosis: d/t steroids, saw endocrine>> no Rx unless has a fx or needs ongoing steroids Allergies, B-spasm (saw allergist remotely, was rx qvar)  Plan: H/o pneumonia, DX and treated elsewhere.  Now asymptomatic, had antibiotics, check a chest x-ray Bronchospasm: Resolved, recommend to keep albuterol as needed Irregular heartbeat?  EKG showing frequent PVCs. We will get a CMP, CBC, magnesium level Get a Holter monitor, 24 hours. If Holter with significant PVCs will get echo and cardiology referral. Flu shot today  RTC 2 months

## 2018-12-07 NOTE — Assessment & Plan Note (Signed)
H/o pneumonia, DX and treated elsewhere.  Now asymptomatic, had antibiotics, check a chest x-ray Bronchospasm: Resolved, recommend to keep albuterol as needed Irregular heartbeat?  EKG showing frequent PVCs. We will get a CMP, CBC, magnesium level Get a Holter monitor, 24 hours. If Holter with significant PVCs will get echo and cardiology referral. Flu shot today  RTC 2 months

## 2019-01-03 ENCOUNTER — Ambulatory Visit (INDEPENDENT_AMBULATORY_CARE_PROVIDER_SITE_OTHER): Payer: BLUE CROSS/BLUE SHIELD

## 2019-01-03 DIAGNOSIS — I499 Cardiac arrhythmia, unspecified: Secondary | ICD-10-CM

## 2019-01-03 DIAGNOSIS — I493 Ventricular premature depolarization: Secondary | ICD-10-CM | POA: Diagnosis not present

## 2019-01-13 DIAGNOSIS — K8301 Primary sclerosing cholangitis: Secondary | ICD-10-CM | POA: Diagnosis not present

## 2019-01-25 NOTE — Addendum Note (Signed)
Addended byDamita Dunnings D on: 01/25/2019 07:47 AM   Modules accepted: Orders

## 2019-01-27 ENCOUNTER — Encounter: Payer: Self-pay | Admitting: Internal Medicine

## 2019-02-07 ENCOUNTER — Ambulatory Visit: Payer: BLUE CROSS/BLUE SHIELD | Admitting: Internal Medicine

## 2019-02-07 ENCOUNTER — Other Ambulatory Visit: Payer: Self-pay

## 2019-02-07 ENCOUNTER — Ambulatory Visit (HOSPITAL_COMMUNITY): Payer: BLUE CROSS/BLUE SHIELD | Attending: Cardiology

## 2019-02-07 DIAGNOSIS — I493 Ventricular premature depolarization: Secondary | ICD-10-CM | POA: Insufficient documentation

## 2019-02-08 ENCOUNTER — Ambulatory Visit: Payer: BLUE CROSS/BLUE SHIELD | Admitting: Internal Medicine

## 2019-02-21 ENCOUNTER — Encounter: Payer: Self-pay | Admitting: Internal Medicine

## 2019-02-21 ENCOUNTER — Other Ambulatory Visit: Payer: Self-pay

## 2019-02-21 ENCOUNTER — Ambulatory Visit (INDEPENDENT_AMBULATORY_CARE_PROVIDER_SITE_OTHER): Payer: BLUE CROSS/BLUE SHIELD | Admitting: Internal Medicine

## 2019-02-21 DIAGNOSIS — I493 Ventricular premature depolarization: Secondary | ICD-10-CM

## 2019-02-21 DIAGNOSIS — R945 Abnormal results of liver function studies: Secondary | ICD-10-CM | POA: Diagnosis not present

## 2019-02-21 DIAGNOSIS — K50919 Crohn's disease, unspecified, with unspecified complications: Secondary | ICD-10-CM

## 2019-02-21 DIAGNOSIS — Z09 Encounter for follow-up examination after completed treatment for conditions other than malignant neoplasm: Secondary | ICD-10-CM

## 2019-02-21 DIAGNOSIS — R7989 Other specified abnormal findings of blood chemistry: Secondary | ICD-10-CM

## 2019-02-21 NOTE — Progress Notes (Addendum)
Subjective:    Patient ID: Reginald Parker, male    DOB: 1976-01-30, 43 y.o.   MRN: [id number]  DOS:  02/21/2019 Type of visit - description: Virtual Visit via Telephone Note  I connected with Reginald Parker on 02/21/19 at  9:20 AM EDT by telephone and verified that I am speaking with the correct person using two identifiers.  THIS ENCOUNTER IS A VIRTUAL VISIT DUE TO COVID-19 - PATIENT WAS NOT SEEN IN THE OFFICE. PATIENT HAS CONSENTED TO VIRTUAL VISIT / TELEMEDICINE VISIT   Location of patient: home  Location of provider: office  I discussed the limitations, risks, security and privacy concerns of performing an evaluation and management service by telephone and the availability of in person appointments. I also discussed with the patient that there may be a patient responsible charge related to this service. The patient expressed understanding and agreed to proceed.   History of Present Illness:   I spoke with the patient, he reports he is doing well.    Review of Systems History of previous pneumonia, no fever, chills, cough. History increased LFTs: Reports that they were re- checked  by GI History of PVCs: Denies chest pain, difficulty breathing or palpitations.  Past Medical History:  Diagnosis Date  . Crohn's disease ([place])    universal colitis  . Elevated liver function tests   . Osteoporosis 06/01/2015  . Primary sclerosing cholangitis     Past Surgical History:  Procedure Laterality Date  . NASAL SINUS SURGERY  2012    Social History   Socioeconomic History  . Marital status: Significant Other    Spouse name: Not on file  . Number of children: 2  . Years of education: Not on file  . Highest education level: Not on file  Occupational History  . Occupation: Dealer  Social Needs  . Financial resource strain: Not on file  . Food insecurity:    Worry: Not on file    Inability: Not on file  . Transportation needs:    Medical: Not on file   Non-medical: Not on file  Tobacco Use  . Smoking status: Never Smoker  . Smokeless tobacco: Never Used  Substance and Sexual Activity  . Alcohol use: Yes    Alcohol/week: 0.0 standard drinks    Comment: rarely has a beer  . Drug use: No  . Sexual activity: Not on file  Lifestyle  . Physical activity:    Days per week: Not on file    Minutes per session: Not on file  . Stress: Not on file  Relationships  . Social connections:    Talks on phone: Not on file    Gets together: Not on file    Attends religious service: Not on file    Active member of club or organization: Not on file    Attends meetings of clubs or organizations: Not on file    Relationship status: Not on file  . Intimate partner violence:    Fear of current or ex partner: Not on file    Emotionally abused: Not on file    Physically abused: Not on file    Forced sexual activity: Not on file  Other Topics Concern  . Not on file  Social History Narrative   Original from [place] w/ girlfriend   Ttwins 2018      Allergies as of 02/21/2019      Reactions   Ibuprofen Other (See Comments)   Causes Heartburn  Medication List       Accurate as of February 21, 2019  9:22 AM. Always use your most recent med list.        albuterol 108 (90 Base) MCG/ACT inhaler Commonly known as:  Ventolin HFA Inhale 2 puffs into the lungs every 6 (six) hours as needed for wheezing or shortness of breath.   azelastine 0.1 % nasal spray Commonly known as:  ASTELIN Place 2 sprays into both nostrils 2 (two) times daily.   cetirizine 10 MG tablet Commonly known as:  ZYRTEC Take 10 mg by mouth daily.   fluticasone 50 MCG/ACT nasal spray Commonly known as:  FLONASE Place 1 spray into both nostrils daily.   Lialda 1.2 g EC tablet Generic drug:  mesalamine Take 4.8 g by mouth daily.   ursodiol 500 MG tablet Commonly known as:  ACTIGALL Take 500 mg by mouth 2 (two) times daily.            Objective:   Physical Exam There were no vitals taken for this visit. This once a phone conference, he sounded well and in no distress    Assessment     Assessment Crohn's disease, universal colitis, last cscope 09-2014, Dr Michail Sermon Primary sclerosing cholangitis Osteoporosis: d/t steroids, saw endocrine>> no Rx unless has a fx or needs ongoing steroids Allergies, B-spasm (saw allergist remotely, was rx qvar)  Plan: Telephone visit Crohn's disease: Good compliance with medication, follow-up by GI History of pneumonia: Asymptomatic, last chest x-ray still showing ATX versus infiltrate, due to coronavirus we will not proceed w/ a CXR but in 1 to 2 months from today. Increased LFTs: See last labs, he was called by GI, according to the patient that LFTs were rechecked and they were normal. Increased PVCs: Holter monitor showed with PVC burden of 13%, echo normal, recommend to be seen by cardiology.  Due to coronavirus, will arrange these nonurgent visit 6 months from now. RTC 4 months, the patient will call and make an appointment in about this.     I discussed the assessment and treatment plan with the patient. The patient was provided an opportunity to ask questions and all were answered. The patient agreed with the plan and demonstrated an understanding of the instructions.   The patient was advised to call back or seek an in-person evaluation if the symptoms worsen or if the condition fails to improve as anticipated.  I provided 15 minutes of non-face-to-face time during this encounter.   Kathlene November, MD

## 2019-02-22 NOTE — Assessment & Plan Note (Addendum)
Telephone visit Crohn's disease: Good compliance with medication, follow-up by GI History of pneumonia: Asymptomatic, last chest x-ray still showing ATX versus infiltrate, due to coronavirus we will not proceed w/ a CXR but in 1 to 2 months from today. Increased LFTs: See last labs, he was called by GI, according to the patient that LFTs were rechecked and they were normal. Increased PVCs: Holter monitor showed with PVC burden of 13%, echo normal, recommend to be seen by cardiology.  Due to coronavirus, will arrange these nonurgent visit 6 months from now. RTC 4 months, the patient will call and make an appointment in about this.

## 2019-03-10 DIAGNOSIS — K5100 Ulcerative (chronic) pancolitis without complications: Secondary | ICD-10-CM | POA: Diagnosis not present

## 2019-03-10 DIAGNOSIS — K8301 Primary sclerosing cholangitis: Secondary | ICD-10-CM | POA: Diagnosis not present

## 2019-03-25 ENCOUNTER — Telehealth: Payer: Self-pay | Admitting: Cardiology

## 2019-03-25 NOTE — Telephone Encounter (Signed)
Virtual Visit Pre-Appointment Phone Call  "(Name), I am calling you today to discuss your upcoming appointment. We are currently trying to limit exposure to the virus that causes COVID-19 by seeing patients at home rather than in the office."  1. "What is the BEST phone number to call the day of the visit?" - include this in appointment notes  2. Do you have or have access to (through a family member/friend) a smartphone with video capability that we can use for your visit?" a. If yes - list this number in appt notes as cell (if different from BEST phone #) and list the appointment type as a VIDEO visit in appointment notes b. If no - list the appointment type as a PHONE visit in appointment notes  3. Confirm consent - "In the setting of the current Covid19 crisis, you are scheduled for a (phone or video) visit with your provider on (date) at (time).  Just as we do with many in-office visits, in order for you to participate in this visit, we must obtain consent.  If you'd like, I can send this to your mychart (if signed up) or email for you to review.  Otherwise, I can obtain your verbal consent now.  All virtual visits are billed to your insurance company just like a normal visit would be.  By agreeing to a virtual visit, we'd like you to understand that the technology does not allow for your provider to perform an examination, and thus may limit your provider's ability to fully assess your condition. If your provider identifies any concerns that need to be evaluated in person, we will make arrangements to do so.  Finally, though the technology is pretty good, we cannot assure that it will always work on either your or our end, and in the setting of a video visit, we may have to convert it to a phone-only visit.  In either situation, we cannot ensure that we have a secure connection.  Are you willing to proceed?" STAFF: Did the patient verbally acknowledge consent to telehealth visit? Document  YES/NO here: YES  4. Advise patient to be prepared - "Two hours prior to your appointment, go ahead and check your blood pressure, pulse, oxygen saturation, and your weight (if you have the equipment to check those) and write them all down. When your visit starts, your provider will ask you for this information. If you have an Apple Watch or Kardia device, please plan to have heart rate information ready on the day of your appointment. Please have a pen and paper handy nearby the day of the visit as well."  5. Give patient instructions for MyChart download to smartphone OR Doximity/Doxy.me as below if video visit (depending on what platform provider is using)  6. Inform patient they will receive a phone call 15 minutes prior to their appointment time (may be from unknown caller ID) so they should be prepared to answer    TELEPHONE CALL NOTE  Reginald Parker has been deemed a candidate for a follow-up tele-health visit to limit community exposure during the Covid-19 pandemic. I spoke with the patient via phone to ensure availability of phone/video source, confirm preferred email & phone number, and discuss instructions and expectations.  I reminded Casimiro Lienhard to be prepared with any vital sign and/or heart rhythm information that could potentially be obtained via home monitoring, at the time of his visit. I reminded Lennex Pietila to expect a phone call prior to his visit.  Frederic Jericho 03/25/2019 10:04 AM   INSTRUCTIONS FOR DOWNLOADING THE MYCHART APP TO SMARTPHONE  - The patient must first make sure to have activated MyChart and know their login information - If Apple, go to [employer] and type in MyChart in the search bar and download the app. If Android, ask patient to go to [employer] and type in [place] in the search bar and download the app. The app is free but as with any other app downloads, their phone may require them to verify saved payment information or  Apple/Android password.  - The patient will need to then log into the app with their MyChart username and password, and select [facility] as their healthcare provider to link the account. When it is time for your visit, go to the MyChart app, find appointments, and click Begin Video Visit. Be sure to Select Allow for your device to access the Microphone and Camera for your visit. You will then be connected, and your provider will be with you shortly.  **If they have any issues connecting, or need assistance please contact MyChart service desk [phone])**  **If using a computer, in order to ensure the best quality for their visit they will need to use either of the following Internet Browsers: [employer], or Google Chrome**  IF USING DOXIMITY or DOXY.ME - The patient will receive a link just prior to their visit by text.     FULL LENGTH CONSENT FOR TELE-HEALTH VISIT   I hereby voluntarily request, consent and authorize [facility] and its employed or contracted physicians, physician assistants, nurse practitioners or other licensed health care professionals (the Practitioner), to provide me with telemedicine health care services (the Services") as deemed necessary by the treating Practitioner. I acknowledge and consent to receive the Services by the Practitioner via telemedicine. I understand that the telemedicine visit will involve communicating with the Practitioner through live audiovisual communication technology and the disclosure of certain medical information by electronic transmission. I acknowledge that I have been given the opportunity to request an in-person assessment or other available alternative prior to the telemedicine visit and am voluntarily participating in the telemedicine visit.  I understand that I have the right to withhold or withdraw my consent to the use of telemedicine in the course of my care at any time, without affecting my right to future care  or treatment, and that the Practitioner or I may terminate the telemedicine visit at any time. I understand that I have the right to inspect all information obtained and/or recorded in the course of the telemedicine visit and may receive copies of available information for a reasonable fee.  I understand that some of the potential risks of receiving the Services via telemedicine include:   Delay or interruption in medical evaluation due to technological equipment failure or disruption;  Information transmitted may not be sufficient (e.g. poor resolution of images) to allow for appropriate medical decision making by the Practitioner; and/or   In rare instances, security protocols could fail, causing a breach of personal health information.  Furthermore, I acknowledge that it is my responsibility to provide information about my medical history, conditions and care that is complete and accurate to the best of my ability. I acknowledge that Practitioner's advice, recommendations, and/or decision may be based on factors not within their control, such as incomplete or inaccurate data provided by me or distortions of diagnostic images or specimens that may result from electronic transmissions. I understand that the  practice of medicine is not an Chief Strategy Officer and that Practitioner makes no warranties or guarantees regarding treatment outcomes. I acknowledge that I will receive a copy of this consent concurrently upon execution via email to the email address I last provided but may also request a printed copy by calling the office of [facility].    I understand that my insurance will be billed for this visit.   I have read or had this consent read to me.  I understand the contents of this consent, which adequately explains the benefits and risks of the Services being provided via telemedicine.   I have been provided ample opportunity to ask questions regarding this consent and the Services and have had  my questions answered to my satisfaction.  I give my informed consent for the services to be provided through the use of telemedicine in my medical care  By participating in this telemedicine visit I agree to the above.

## 2019-03-28 ENCOUNTER — Other Ambulatory Visit: Payer: Self-pay

## 2019-03-28 ENCOUNTER — Telehealth (INDEPENDENT_AMBULATORY_CARE_PROVIDER_SITE_OTHER): Payer: BLUE CROSS/BLUE SHIELD | Admitting: Cardiology

## 2019-03-28 ENCOUNTER — Encounter: Payer: Self-pay | Admitting: Cardiology

## 2019-03-28 VITALS — BP 105/78 | Ht 63.0 in | Wt 186.0 lb

## 2019-03-28 DIAGNOSIS — K5090 Crohn's disease, unspecified, without complications: Secondary | ICD-10-CM | POA: Diagnosis not present

## 2019-03-28 DIAGNOSIS — I493 Ventricular premature depolarization: Secondary | ICD-10-CM

## 2019-03-28 NOTE — Progress Notes (Signed)
Virtual Visit via Video Note   This visit type was conducted due to national recommendations for restrictions regarding the COVID-19 Pandemic (e.g. social distancing) in an effort to limit this patient's exposure and mitigate transmission in our community.  Due to his co-morbid illnesses, this patient is at least at moderate risk for complications without adequate follow up.  This format is felt to be most appropriate for this patient at this time.  All issues noted in this document were discussed and addressed.  A limited physical exam was performed with this format.  Please refer to the patient's chart for his consent to telehealth for [facility].   Date:  03/28/2019   ID:  Reginald Parker, DOB 24-Oct-1976, MRN [id number]  Patient Location: Other:  Office Provider Location: Home  PCP:  Colon Branch, MD  Cardiologist:  No primary care provider on file.  Electrophysiologist:  None   Evaluation Performed:  New Patient Evaluation  Chief Complaint: Frequent PVCs  History of Present Illness:    Reginald Parker is a 43 y.o. male with past medical history of Crohn's disease.  He denies any history of essential hypertension dyslipidemia or diabetes mellitus.  He takes care of activities of daily living.  He leads a sedentary lifestyle.  He was found to have frequent PVCs on the monitor and therefore referred to my evaluation.  At the time of my evaluation, the patient is alert awake oriented and in no distress.  No dizziness no palpitations no syncope or any such episodes.  The patient does not have symptoms concerning for COVID-19 infection (fever, chills, cough, or new shortness of breath).    Past Medical History:  Diagnosis Date   Crohn's disease ([place])    universal colitis   Elevated liver function tests    Osteoporosis 06/01/2015   Primary sclerosing cholangitis    Past Surgical History:  Procedure Laterality Date   NASAL SINUS SURGERY  2012     Current Meds    Medication Sig   albuterol (VENTOLIN HFA) 108 (90 Base) MCG/ACT inhaler Inhale 2 puffs into the lungs every 6 (six) hours as needed for wheezing or shortness of breath.   azelastine (ASTELIN) 0.1 % nasal spray Place 2 sprays into both nostrils 2 (two) times daily.   cetirizine (ZYRTEC) 10 MG tablet Take 10 mg by mouth daily.   LIALDA 1.2 G EC tablet Take 4.8 g by mouth daily.   ursodiol (ACTIGALL) 500 MG tablet Take 500 mg by mouth 2 (two) times daily.     Allergies:   Ibuprofen   Social History   Tobacco Use   Smoking status: Never Smoker   Smokeless tobacco: Never Used  Substance Use Topics   Alcohol use: Yes    Alcohol/week: 0.0 standard drinks    Comment: rarely has a beer   Drug use: No     Family Hx: The patient's family history is negative for Colon cancer, Prostate cancer, Diabetes, and CAD.  ROS:   Please see the history of present illness.    As mentioned above All other systems reviewed and are negative.   Prior CV studies:   The following studies were reviewed today:  Holter monitor and echocardiogram report was reviewed  Labs/Other Tests and Data Reviewed:    EKG:  An ECG dated December 06, 2018 was personally reviewed today and demonstrated:  Sinus rhythm and multiple PVCs  Recent Labs: 05/26/2018: TSH 1.31 12/06/2018: ALT 107; BUN 9; Creatinine, Ser 0.88; Hemoglobin 15.5; Magnesium  2.0; Platelets 219.0; Potassium 4.2; Sodium 137   Recent Lipid Panel Lab Results  Component Value Date/Time   CHOL 155 05/26/2018 08:56 AM   TRIG 111.0 05/26/2018 08:56 AM   HDL 39.20 05/26/2018 08:56 AM   CHOLHDL 4 05/26/2018 08:56 AM   LDLCALC 94 05/26/2018 08:56 AM    Wt Readings from Last 3 Encounters:  03/28/19 186 lb (84.4 kg)  12/06/18 181 lb 6 oz (82.3 kg)  05/26/18 185 lb (83.9 kg)     Objective:    Vital Signs:  BP 105/78 (BP Location: Left Arm, Patient Position: Sitting, Cuff Size: Normal)    Ht 5' 3"  (1.6 m)    Wt 186 lb (84.4 kg)    BMI 32.95  kg/m    VITAL SIGNS:  reviewed  ASSESSMENT & PLAN:    1. Frequent PVCs: I reassured the patient about my findings.  Overall he is an active gentleman without any symptoms.  No palpitations no syncope or any such problems.  His echocardiogram and blood work were unremarkable and I reviewed it and I reassured.  He does not have much in terms of risk factors for coronary artery disease.  I told him to maintain an active lifestyle.  He does not have any chest pain orthopnea PND or syncope.  He will start walking on a regular basis.  I told him that I would like to do a stress test in the future.  I do not want to do this in a rush because of the viral pandemic.  I will see him in follow-up appointment in 3 weeks and at that time make those decisions based on the situation with the pandemic at that time.  He knows to go to the nearest emergency room for any concerning symptoms. 2. He has Crohn's disease and is followed by his primary care physician and gastroenterologist.  His LFTs are elevated and he is aware of it.  COVID-19 Education: The signs and symptoms of COVID-19 were discussed with the patient and how to seek care for testing (follow up with PCP or arrange E-visit).  The importance of social distancing was discussed today.  Time:   Today, I have spent 30 minutes with the patient with telehealth technology discussing the above problems.  Total time for evaluation chart review and other issues were 45 minutes   Medication Adjustments/Labs and Tests Ordered: Current medicines are reviewed at length with the patient today.  Concerns regarding medicines are outlined above.   Tests Ordered: No orders of the defined types were placed in this encounter.   Medication Changes: No orders of the defined types were placed in this encounter.   Disposition:  Follow up in 3 week(s)  Signed, Jenean Lindau, MD  03/28/2019 10:25 AM    [place]

## 2019-03-28 NOTE — Patient Instructions (Signed)
Medication Instructions:  Your physician recommends that you continue on your current medications as directed. Please refer to the Current Medication list given to you today.  If you need a refill on your cardiac medications before your next appointment, please call your pharmacy.   Lab work: NONE If you have labs (blood work) drawn today and your tests are completely normal, you will receive your results only by: Marland Kitchen MyChart Message (if you have MyChart) OR . A paper copy in the mail If you have any lab test that is abnormal or we need to change your treatment, we will call you to review the results.  Testing/Procedures: NONE  Follow-Up: At [facility], you and your health needs are our priority.  As part of our continuing mission to provide you with exceptional heart care, we have created designated Provider Care Teams.  These Care Teams include your primary Cardiologist (physician) and Advanced Practice Providers (APPs -  Physician Assistants and Nurse Practitioners) who all work together to provide you with the care you need, when you need it. You will need a follow up appointment in 3 weeks.

## 2019-04-19 ENCOUNTER — Telehealth (INDEPENDENT_AMBULATORY_CARE_PROVIDER_SITE_OTHER): Payer: BLUE CROSS/BLUE SHIELD | Admitting: Cardiology

## 2019-04-19 ENCOUNTER — Encounter: Payer: Self-pay | Admitting: Cardiology

## 2019-04-19 ENCOUNTER — Other Ambulatory Visit: Payer: Self-pay

## 2019-04-19 VITALS — BP 122/72 | HR 74 | Ht 63.0 in | Wt 185.0 lb

## 2019-04-19 DIAGNOSIS — I493 Ventricular premature depolarization: Secondary | ICD-10-CM

## 2019-04-19 NOTE — Patient Instructions (Signed)
Medication Instructions:  Your physician recommends that you continue on your current medications as directed. Please refer to the Current Medication list given to you today.  If you need a refill on your cardiac medications before your next appointment, please call your pharmacy.   Lab work: NONE If you have labs (blood work) drawn today and your tests are completely normal, you will receive your results only by: Marland Kitchen MyChart Message (if you have MyChart) OR . A paper copy in the mail If you have any lab test that is abnormal or we need to change your treatment, we will call you to review the results.  Testing/Procedures: Your physician has recommended that you wear a holter monitor. Holter monitors are medical devices that record the heart's electrical activity. Doctors most often use these monitors to diagnose arrhythmias. Arrhythmias are problems with the speed or rhythm of the heartbeat. The monitor is a small, portable device. You can wear one while you do your normal daily activities. This is usually used to diagnose what is causing palpitations/syncope (passing out). You will wear this device for 2 days.    Follow-Up: At [facility], you and your health needs are our priority.  As part of our continuing mission to provide you with exceptional heart care, we have created designated Provider Care Teams.  These Care Teams include your primary Cardiologist (physician) and Advanced Practice Providers (APPs -  Physician Assistants and Nurse Practitioners) who all work together to provide you with the care you need, when you need it. You will need a follow up appointment in 1 months.   Any Other Special Instructions Will Be Listed Below   Ambulatory Cardiac Monitoring An ambulatory cardiac monitor is a small recording device that is used to detect abnormal heart rhythms (arrhythmias). Most monitors are connected by wires to flat, sticky disks (electrodes) that are then attached to your  chest. You may need to wear a monitor if you have had symptoms such as:  Fast heartbeats (palpitations).  Dizziness.  Fainting or light-headedness.  Unexplained weakness.  Shortness of breath. There are several types of monitors. Some common monitors include:  Holter monitor. This records your heart rhythm continuously, usually for 24-48 hours.  Event (episodic) monitor. This monitor has a symptoms button, and when pushed, it will begin recording. You need to activate this monitor to record when you have a heart-related symptom.  Automatic detection monitor. This monitor will begin recording when it detects an abnormal heartbeat. What are the risks? Generally, these devices are safe to use. However, it is possible that the skin under the electrodes will become irritated. How to prepare for monitoring Your health care provider will prepare your chest for the electrode placement and show you how to use the monitor.  Do not apply lotions to your chest before monitoring.  Follow directions on how to care for the monitor, and how to return the monitor when the testing period is complete. How to use your cardiac monitor  Follow directions about how long to wear the monitor, and if you can take the monitor off in order to shower or bathe. ? Do not let the monitor get wet. ? Do not bathe, swim, or use a hot tub while wearing the monitor.  Keep your skin clean. Do not put body lotion or moisturizer on your chest.  Change the electrodes as told by your health care provider, or any time they stop sticking to your skin. You may need to use medical tape  to keep them on.  Try to put the electrodes in slightly different places on your chest to help prevent skin irritation. Follow directions from your health care provider about where to place the electrodes.  Make sure the monitor is safely clipped to your clothing or in a location close to your body as recommended by your health care provider.   If your monitor has a symptoms button, press the button to mark an event as soon as you feel a heart-related symptom, such as: ? Dizziness. ? Weakness. ? Light-headedness. ? Palpitations. ? Thumping or pounding in your chest. ? Shortness of breath. ? Unexplained weakness.  Keep a diary of your activities, such as walking, doing chores, and taking medicine. It is very important to note what you were doing when you pushed the button to record your symptoms. This will help your health care provider determine what might be contributing to your symptoms.  Send the recorded information as recommended by your health care provider. It may take some time for your health care provider to process the results.  Change the batteries as told by your health care provider.  Keep electronic devices away from your monitor. These include: ? Tablets. ? MP3 players. ? Cell phones.  While wearing your monitor you should avoid: ? Electric blankets. ? Armed forces operational officer. ? Electric toothbrushes. ? Microwave ovens. ? Magnets. ? Metal detectors. Get help right away if:  You have chest pain.  You have shortness of breath or extreme difficulty breathing.  You develop a very fast heartbeat that does not get better.  You develop dizziness that does not go away.  You faint or constantly feel like you are about to faint. Summary  An ambulatory cardiac monitor is a small recording device that is used to detect abnormal heart rhythms (arrhythmias).  Make sure you understand how to send the information from the monitor to your health care provider.  It is important to press the button on the monitor when you have any heart-related symptoms.  Keep a diary of your activities, such as walking, doing chores, and taking medicine. It is very important to note what you were doing when you pushed the button to record your symptoms. This will help your health care provider learn what might be causing your  symptoms. This information is not intended to replace advice given to you by your health care provider. Make sure you discuss any questions you have with your health care provider. Document Released: 08/19/2008 Document Revised: 08/26/2017 Document Reviewed: 10/25/2016 Elsevier Interactive Patient Education  2019 [employer].

## 2019-04-19 NOTE — Progress Notes (Signed)
.    Virtual Visit via Video Note   This visit type was conducted due to national recommendations for restrictions regarding the COVID-19 Pandemic (e.g. social distancing) in an effort to limit this patient's exposure and mitigate transmission in our community.  Due to his co-morbid illnesses, this patient is at least at moderate risk for complications without adequate follow up.  This format is felt to be most appropriate for this patient at this time.  All issues noted in this document were discussed and addressed.  A limited physical exam was performed with this format.  Please refer to the patient's chart for his consent to telehealth for [facility].   Date:  04/19/2019   ID:  Reginald Parker, DOB [birth year], MRN [id number]  Patient Location: Home Provider Location: Home  PCP:  Colon Branch, MD  Cardiologist:  No primary care provider on file.  Electrophysiologist:  None   Evaluation Performed:  Follow-Up Visit  Chief Complaint: PVCs and palpitations  History of Present Illness:    Reginald Parker is a 43 y.o. male with past medical history of Crohn's disease.  The patient mentions to me that he was experiencing palpitations but these have resolved significantly.  He is still concerned about them.  He has been told to have PVCs.  This is concerning.  At the time of my evaluation, the patient is alert awake oriented and in no distress.  The patient does not have symptoms concerning for COVID-19 infection (fever, chills, cough, or new shortness of breath).    Past Medical History:  Diagnosis Date  . Crohn's disease ([place])    universal colitis  . Elevated liver function tests   . Osteoporosis 06/01/2015  . Primary sclerosing cholangitis    Past Surgical History:  Procedure Laterality Date  . NASAL SINUS SURGERY  2012     Current Meds  Medication Sig  . albuterol (VENTOLIN HFA) 108 (90 Base) MCG/ACT inhaler Inhale 2 puffs into the lungs every 6 (six) hours as needed for  wheezing or shortness of breath.  Marland Kitchen azelastine (ASTELIN) 0.1 % nasal spray Place 2 sprays into both nostrils 2 (two) times daily.  . cetirizine (ZYRTEC) 10 MG tablet Take 10 mg by mouth daily.  Marland Kitchen LIALDA 1.2 G EC tablet Take 4.8 g by mouth daily.  . ursodiol (ACTIGALL) 500 MG tablet Take 500 mg by mouth 2 (two) times daily.     Allergies:   Ibuprofen   Social History   Tobacco Use  . Smoking status: Never Smoker  . Smokeless tobacco: Never Used  Substance Use Topics  . Alcohol use: Yes    Alcohol/week: 0.0 standard drinks    Comment: rarely has a beer  . Drug use: No     Family Hx: The patient's family history is negative for Colon cancer, Prostate cancer, Diabetes, and CAD.  ROS:   Please see the history of present illness.    As above All other systems reviewed and are negative.   Prior CV studies:   The following studies were reviewed today:  I reviewed lab work with the patient extensively  Labs/Other Tests and Data Reviewed:    EKG:  No ECG reviewed.  Recent Labs: 05/26/2018: TSH 1.31 12/06/2018: ALT 107; BUN 9; Creatinine, Ser 0.88; Hemoglobin 15.5; Magnesium 2.0; Platelets 219.0; Potassium 4.2; Sodium 137   Recent Lipid Panel Lab Results  Component Value Date/Time   CHOL 155 05/26/2018 08:56 AM   TRIG 111.0 05/26/2018 08:56 AM   HDL  39.20 05/26/2018 08:56 AM   CHOLHDL 4 05/26/2018 08:56 AM   LDLCALC 94 05/26/2018 08:56 AM    Wt Readings from Last 3 Encounters:  04/19/19 185 lb (83.9 kg)  03/28/19 186 lb (84.4 kg)  12/06/18 181 lb 6 oz (82.3 kg)     Objective:    Vital Signs:  Ht 5' 3"  (1.6 m)   Wt 185 lb (83.9 kg)   BMI 32.77 kg/m    VITAL SIGNS:  reviewed  ASSESSMENT & PLAN:    1. Palpitations: These have resolved significantly.  I reassured him about them.  He has PVCs noted in the past for which reason we will do a 2-day 48-hour Holter monitor.  He will also have an echocardiogram as he has been told about a cardiac murmur in the past. 2.  Importance of regular exercise stressed.  He walks 20 minutes a day now without any problem I told him to walk at least 30 minutes a day 5 days a week at least.  Weight reduction was stressed risks of obesity explained and he vocalized understanding. 3. Follow-up appointment in a month or earlier if he has any concerns.  COVID-19 Education: The signs and symptoms of COVID-19 were discussed with the patient and how to seek care for testing (follow up with PCP or arrange E-visit).  The importance of social distancing was discussed today.  Time:   Today, I have spent 15 minutes with the patient with telehealth technology discussing the above problems.     Medication Adjustments/Labs and Tests Ordered: Current medicines are reviewed at length with the patient today.  Concerns regarding medicines are outlined above.   Tests Ordered: No orders of the defined types were placed in this encounter.   Medication Changes: No orders of the defined types were placed in this encounter.   Disposition:  Follow up in 1 month(s)  Signed, Jenean Lindau, MD  04/19/2019 9:11 AM    [place]

## 2019-04-19 NOTE — Addendum Note (Signed)
Addended by: Beckey Rutter on: 04/19/2019 12:11 PM   Modules accepted: Orders

## 2019-04-25 ENCOUNTER — Telehealth: Payer: Self-pay | Admitting: Internal Medicine

## 2019-04-25 DIAGNOSIS — J189 Pneumonia, unspecified organism: Secondary | ICD-10-CM

## 2019-04-25 NOTE — Telephone Encounter (Signed)
Order placed

## 2019-04-25 NOTE — Telephone Encounter (Signed)
Spoke with the patient, due for a chest x-ray Heather, please enter future chest x-ray DX follow-up pneumonia. Patient plans to have it done this week.

## 2019-04-27 ENCOUNTER — Ambulatory Visit (HOSPITAL_BASED_OUTPATIENT_CLINIC_OR_DEPARTMENT_OTHER)
Admission: RE | Admit: 2019-04-27 | Discharge: 2019-04-27 | Disposition: A | Discharge status: Home / Self Care | Payer: BLUE CROSS/BLUE SHIELD | Source: Ambulatory Visit | Attending: Internal Medicine | Admitting: Internal Medicine

## 2019-04-27 ENCOUNTER — Telehealth: Payer: Self-pay | Admitting: *Deleted

## 2019-04-27 ENCOUNTER — Other Ambulatory Visit: Payer: Self-pay

## 2019-04-27 DIAGNOSIS — J189 Pneumonia, unspecified organism: Secondary | ICD-10-CM | POA: Diagnosis not present

## 2019-04-27 DIAGNOSIS — J984 Other disorders of lung: Secondary | ICD-10-CM | POA: Diagnosis not present

## 2019-04-27 NOTE — Telephone Encounter (Signed)
Spoke with pt about monitor. Let him know that it was not supposed to be mailed to him and he can send it back if he wants to. I gave him a number to call to find out about the cost of monitor. He is going to call company and then let us know what he wants to do.

## 2019-04-28 ENCOUNTER — Telehealth: Payer: Self-pay

## 2019-04-28 NOTE — Telephone Encounter (Signed)
Patient had questions about how to apply Zio, how long to wear device, how to return and getting his results. All questions answered.

## 2019-04-28 NOTE — Telephone Encounter (Signed)
-----   Message from Jenean Lindau, MD sent at 04/26/2019  4:02 PM EDT ----- Regarding: RE: 48 Hour Holter Yes we need to speak to billing because he really needs to know the exact cost ----- Message ----- From: Beckey Rutter, RN Sent: 04/26/2019   1:09 PM EDT To: Anselm Pancoast, CMA, Jenean Lindau, MD Subject: RE: [place]                             So Dr. Geraldo Pitter would now like to verify with patient insurance the cost difference in the two devices. Patient has received the monitor that was sent to him but hesitant to use if prior to finding out true cost. Not sure if this is a question you can answer or I need to speak to someone in billing? Reginald Parker ----- Message ----- From: Anselm Pancoast, CMA Sent: 04/25/2019   2:10 PM EDT To: Jenean Lindau, MD, Beckey Rutter, RN Subject: 48 Hour Holter                                 Called [employer] to see if we could order 48 hour monitor and person I talked to said yes. Tried to order the monitor and found out that this is not true. They are not doing the 24 or 48 hour monitors at this time due to the Covid 19 and these monitors are reused by pt's. Will not be able to do this type of monitor at this time.  Abbie

## 2019-04-30 DIAGNOSIS — I493 Ventricular premature depolarization: Secondary | ICD-10-CM | POA: Diagnosis not present

## 2019-05-03 ENCOUNTER — Other Ambulatory Visit (INDEPENDENT_AMBULATORY_CARE_PROVIDER_SITE_OTHER): Payer: BC Managed Care – PPO

## 2019-05-03 DIAGNOSIS — I493 Ventricular premature depolarization: Secondary | ICD-10-CM | POA: Diagnosis not present

## 2019-05-20 ENCOUNTER — Encounter: Payer: Self-pay | Admitting: Cardiology

## 2019-05-20 ENCOUNTER — Telehealth (INDEPENDENT_AMBULATORY_CARE_PROVIDER_SITE_OTHER): Payer: BC Managed Care – PPO | Admitting: Cardiology

## 2019-05-20 ENCOUNTER — Other Ambulatory Visit: Payer: Self-pay

## 2019-05-20 VITALS — BP 119/68 | Ht 63.0 in | Wt 180.0 lb

## 2019-05-20 DIAGNOSIS — K50919 Crohn's disease, unspecified, with unspecified complications: Secondary | ICD-10-CM | POA: Diagnosis not present

## 2019-05-20 DIAGNOSIS — I493 Ventricular premature depolarization: Secondary | ICD-10-CM

## 2019-05-20 NOTE — Progress Notes (Signed)
Virtual Visit via Video Note   This visit type was conducted due to national recommendations for restrictions regarding the COVID-19 Pandemic (e.g. social distancing) in an effort to limit this patient's exposure and mitigate transmission in our community.  Due to his co-morbid illnesses, this patient is at least at moderate risk for complications without adequate follow up.  This format is felt to be most appropriate for this patient at this time.  All issues noted in this document were discussed and addressed.  A limited physical exam was performed with this format.  Please refer to the patient's chart for his consent to telehealth for [facility].   Date:  05/20/2019   ID:  Reginald Parker, DOB [birth year], MRN [id number]  Patient Location: Home Provider Location: Office  PCP:  Colon Branch, MD  Cardiologist:  No primary care provider on file.  Electrophysiologist:  None   Evaluation Performed:  Follow-Up Visit  Chief Complaint: Frequent PVCs  History of Present Illness:    Reginald Parker is a 43 y.o. male with past medical history that is not much significant.  He has Crohn's disease.  He was found to have frequent PVCs on the monitor and therefore was evaluated.  He denies any chest pain orthopnea or PND.  At the time of my evaluation, the patient is alert awake oriented and in no distress.  She has begun exercising on a regular basis.  He had Holter monitor for a couple of days and during that time I tried to encourage him to walk and walk briskly on a regular basis so that we can understand this exercise tolerance and his rhythm issues during exertion.  At the time of my evaluation, the patient is alert awake oriented and in no distress.  The patient does not have symptoms concerning for COVID-19 infection (fever, chills, cough, or new shortness of breath).    Past Medical History:  Diagnosis Date  . Crohn's disease ([place])    universal colitis  . Elevated liver function  tests   . Osteoporosis 06/01/2015  . Primary sclerosing cholangitis    Past Surgical History:  Procedure Laterality Date  . NASAL SINUS SURGERY  2012     Current Meds  Medication Sig  . albuterol (VENTOLIN HFA) 108 (90 Base) MCG/ACT inhaler Inhale 2 puffs into the lungs every 6 (six) hours as needed for wheezing or shortness of breath.  Marland Kitchen azelastine (ASTELIN) 0.1 % nasal spray Place 2 sprays into both nostrils 2 (two) times daily.  . cetirizine (ZYRTEC) 10 MG tablet Take 10 mg by mouth daily.  Marland Kitchen LIALDA 1.2 G EC tablet Take 4.8 g by mouth daily.  . ursodiol (ACTIGALL) 500 MG tablet Take 500 mg by mouth 2 (two) times daily.     Allergies:   Ibuprofen   Social History   Tobacco Use  . Smoking status: Never Smoker  . Smokeless tobacco: Never Used  Substance Use Topics  . Alcohol use: Yes    Alcohol/week: 0.0 standard drinks    Comment: rarely has a beer  . Drug use: No     Family Hx: The patient's family history is negative for Colon cancer, Prostate cancer, Diabetes, and CAD.  ROS:   Please see the history of present illness.    As mentioned above All other systems reviewed and are negative.   Prior CV studies:   The following studies were reviewed today:  IMPRESSIONS    1. The left ventricle has low normal systolic function,  with an ejection fraction of 50-55%. The cavity size was normal. Left ventricular diastolic parameters were normal.  2. The tricuspid valve is grossly normal.  3. The aortic valve is tricuspid no stenosis of the aortic valve.  4. Low normal LV systolic function; normal diastolic function; frequent PVCs noted throughout.   Labs/Other Tests and Data Reviewed:    EKG:  No ECG reviewed.  Recent Labs: 05/26/2018: TSH 1.31 12/06/2018: ALT 107; BUN 9; Creatinine, Ser 0.88; Hemoglobin 15.5; Magnesium 2.0; Platelets 219.0; Potassium 4.2; Sodium 137   Recent Lipid Panel Lab Results  Component Value Date/Time   CHOL 155 05/26/2018 08:56 AM   TRIG  111.0 05/26/2018 08:56 AM   HDL 39.20 05/26/2018 08:56 AM   CHOLHDL 4 05/26/2018 08:56 AM   LDLCALC 94 05/26/2018 08:56 AM    Wt Readings from Last 3 Encounters:  05/20/19 180 lb (81.6 kg)  04/19/19 185 lb (83.9 kg)  03/28/19 186 lb (84.4 kg)     Objective:    Vital Signs:  BP 119/68 (BP Location: Left Arm, Patient Position: Sitting, Cuff Size: Normal)   Ht 5' 3"  (1.6 m)   Wt 180 lb (81.6 kg)   BMI 31.89 kg/m    VITAL SIGNS:  reviewed  ASSESSMENT & PLAN:    1. Frequent PVCs: I mentioned to the patient that I am happy about the fact that he is asymptomatic at this time.  Importance of regular exercise stressed.  I am waiting for the Holter monitor report and will discuss this with him.  He has taken brisk walks during the monitoring.  And he will will help me understand PVCs on exertion.  Also I wanted to do a treadmill stress test but because of the COVID-19 situation were not doing exercise stress testing at this time on treadmills.  Patient's hemodynamics are stable he is asymptomatic.Patient will be seen in follow-up appointment in 6 months or earlier if the patient has any concerns   COVID-19 Education: The signs and symptoms of COVID-19 were discussed with the patient and how to seek care for testing (follow up with PCP or arrange E-visit).  The importance of social distancing was discussed today.  Time:   Today, I have spent 15 minutes with the patient with telehealth technology discussing the above problems.     Medication Adjustments/Labs and Tests Ordered: Current medicines are reviewed at length with the patient today.  Concerns regarding medicines are outlined above.   Tests Ordered: No orders of the defined types were placed in this encounter.   Medication Changes: No orders of the defined types were placed in this encounter.   Follow Up:  Virtual Visit or In Person in 6 month(s)  Signed, Jenean Lindau, MD  05/20/2019 8:52 AM    [place]

## 2019-05-20 NOTE — Patient Instructions (Signed)

## 2019-05-30 ENCOUNTER — Ambulatory Visit (INDEPENDENT_AMBULATORY_CARE_PROVIDER_SITE_OTHER): Payer: BC Managed Care – PPO | Admitting: Internal Medicine

## 2019-05-30 ENCOUNTER — Encounter: Payer: Self-pay | Admitting: Internal Medicine

## 2019-05-30 ENCOUNTER — Other Ambulatory Visit: Payer: Self-pay

## 2019-05-30 VITALS — BP 110/76 | HR 79 | Temp 98.1°F | Resp 16 | Ht 63.0 in | Wt 179.2 lb

## 2019-05-30 DIAGNOSIS — Z0000 Encounter for general adult medical examination without abnormal findings: Secondary | ICD-10-CM | POA: Diagnosis not present

## 2019-05-30 LAB — COMPREHENSIVE METABOLIC PANEL
ALT: 46 U/L (ref 0–53)
AST: 31 U/L (ref 0–37)
Albumin: 4.4 g/dL (ref 3.5–5.2)
Alkaline Phosphatase: 155 U/L — ABNORMAL HIGH (ref 39–117)
BUN: 11 mg/dL (ref 6–23)
CO2: 27 mEq/L (ref 19–32)
Calcium: 9.2 mg/dL (ref 8.4–10.5)
Chloride: 102 mEq/L (ref 96–112)
Creatinine, Ser: 0.88 mg/dL (ref 0.40–1.50)
GFR: 94.25 mL/min (ref >60.00)
Glucose, Bld: 109 mg/dL — ABNORMAL HIGH (ref 70–99)
Potassium: 4.1 mEq/L (ref 3.5–5.1)
Sodium: 138 mEq/L (ref 135–145)
Total Bilirubin: 2.2 mg/dL — ABNORMAL HIGH (ref 0.2–1.2)
Total Protein: 7.9 g/dL (ref 6.0–8.3)

## 2019-05-30 LAB — LIPID PANEL
Cholesterol: 150 mg/dL (ref 0–200)
HDL: 41.8 mg/dL (ref >39.00)
LDL Cholesterol: 85 mg/dL (ref 0–99)
NonHDL: 108.62
Total CHOL/HDL Ratio: 4
Triglycerides: 119 mg/dL (ref 0.0–149.0)
VLDL: 23.8 mg/dL (ref 0.0–40.0)

## 2019-05-30 NOTE — Assessment & Plan Note (Signed)
Freq PVCs: saw cards, another monitor ordered, continue asx  H/o PNM: f/u CXR showed scars , pt is asx   RTC 1 year

## 2019-05-30 NOTE — Assessment & Plan Note (Addendum)
Td 2017;  PNM 23 2017 ; rec flu shot this year Colon cancer screening -- per GI Prostate cancer screening: Not indicated Diet and exercise: active at work, has increased exercise, praised; diet discussed  Labs reviewed, due for a FLP CMP

## 2019-05-30 NOTE — Patient Instructions (Signed)
Get the blood work     [place] Schedule your next appointment    For a physical in 1 year   Get a flu shot this year

## 2019-05-30 NOTE — Progress Notes (Signed)
Pre visit review using our clinic review tool, if applicable. No additional management support is needed unless otherwise documented below in the visit note. 

## 2019-05-30 NOTE — Progress Notes (Signed)
Subjective:    Patient ID: Reginald Parker, male    DOB: May 27, 1976, 43 y.o.   MRN: [id number]  DOS:  05/30/2019 Type of visit - description: cpx No major concerns Saw cardiology, note reviewed    Review of Systems   A 14 point review of systems is negative    Past Medical History:  Diagnosis Date  . Crohn's disease ([place])    universal colitis  . Elevated liver function tests   . Osteoporosis 06/01/2015  . Primary sclerosing cholangitis     Past Surgical History:  Procedure Laterality Date  . NASAL SINUS SURGERY  2012    Social History   Socioeconomic History  . Marital status: Significant Other    Spouse name: Not on file  . Number of children: 2  . Years of education: Not on file  . Highest education level: Not on file  Occupational History  . Occupation: Dealer  Social Needs  . Financial resource strain: Not on file  . Food insecurity    Worry: Not on file    Inability: Not on file  . Transportation needs    Medical: Not on file    Non-medical: Not on file  Tobacco Use  . Smoking status: Never Smoker  . Smokeless tobacco: Never Used  Substance and Sexual Activity  . Alcohol use: Yes    Alcohol/week: 0.0 standard drinks    Comment: rarely has a beer  . Drug use: No  . Sexual activity: Not on file  Lifestyle  . Physical activity    Days per week: Not on file    Minutes per session: Not on file  . Stress: Not on file  Relationships  . Social Herbalist on phone: Not on file    Gets together: Not on file    Attends religious service: Not on file    Active member of club or organization: Not on file    Attends meetings of clubs or organizations: Not on file    Relationship status: Not on file  . Intimate partner violence    Fear of current or ex partner: Not on file    Emotionally abused: Not on file    Physically abused: Not on file    Forced sexual activity: Not on file  Other Topics Concern  . Not on file  Social History  Narrative   Original from [place] w/ girlfriend   Ttwins 2018     Family History  Problem Relation Age of Onset  . Colon cancer Neg Hx   . Prostate cancer Neg Hx   . Diabetes Neg Hx   . CAD Neg Hx      Allergies as of 05/30/2019      Reactions   Ibuprofen Other (See Comments)   Causes Heartburn      Medication List       Accurate as of May 30, 2019  8:23 PM. If you have any questions, ask your nurse or doctor.        albuterol 108 (90 Base) MCG/ACT inhaler Commonly known as: Ventolin HFA Inhale 2 puffs into the lungs every 6 (six) hours as needed for wheezing or shortness of breath.   azelastine 0.1 % nasal spray Commonly known as: ASTELIN Place 2 sprays into both nostrils 2 (two) times daily.   cetirizine 10 MG tablet Commonly known as: ZYRTEC Take 10 mg by mouth daily.   Lialda 1.2 g EC tablet Generic drug: mesalamine Take  4.8 g by mouth daily.   ursodiol 500 MG tablet Commonly known as: ACTIGALL Take 500 mg by mouth 2 (two) times daily.           Objective:   Physical Exam BP 110/76 (BP Location: Left Arm, Patient Position: Sitting, Cuff Size: Small)   Pulse 79   Temp 98.1 F (36.7 C) (Oral)   Resp 16   Ht 5' 3"  (1.6 m)   Wt 179 lb 4 oz (81.3 kg)   SpO2 100%   BMI 31.75 kg/m  General: Well developed, NAD, BMI noted Neck: No  thyromegaly  HEENT:  Normocephalic . Face symmetric, atraumatic Lungs:  CTA B Normal respiratory effort, no intercostal retractions, no accessory muscle use. Heart: RRR,  no murmur.  No pretibial edema bilaterally  Abdomen:  Not distended, soft, non-tender. No rebound or rigidity.   Skin: Exposed areas without rash. Not pale. Not jaundice Neurologic:  alert & oriented X3.  Speech normal, gait appropriate for age and unassisted Strength symmetric and appropriate for age.  Psych: Cognition and judgment appear intact.  Cooperative with normal attention span and concentration.  Behavior  appropriate. No anxious or depressed appearing.     Assessment     Assessment Crohn's disease, universal colitis, last cscope 09-2014, Dr Michail Sermon Primary sclerosing cholangitis Osteoporosis: d/t steroids, saw endocrine>> no Rx unless has a fx or needs ongoing steroids Allergies, B-spasm (saw allergist remotely, was rx qvar)  Plan: Freq PVCs: saw cards, another monitor ordered, continue asx  H/o PNM: f/u CXR showed scars , pt is asx   RTC 1 year

## 2019-06-01 ENCOUNTER — Other Ambulatory Visit: Payer: Self-pay | Admitting: Cardiology

## 2019-06-01 DIAGNOSIS — I493 Ventricular premature depolarization: Secondary | ICD-10-CM

## 2019-06-13 DIAGNOSIS — K5100 Ulcerative (chronic) pancolitis without complications: Secondary | ICD-10-CM | POA: Diagnosis not present

## 2019-07-22 ENCOUNTER — Telehealth: Payer: Self-pay

## 2019-07-22 NOTE — Telephone Encounter (Signed)
Left a message regarding appt on 07/25/19.

## 2019-07-25 ENCOUNTER — Telehealth (INDEPENDENT_AMBULATORY_CARE_PROVIDER_SITE_OTHER): Payer: BC Managed Care – PPO | Admitting: Internal Medicine

## 2019-07-25 ENCOUNTER — Other Ambulatory Visit: Payer: Self-pay

## 2019-07-25 ENCOUNTER — Encounter: Payer: Self-pay | Admitting: Internal Medicine

## 2019-07-25 ENCOUNTER — Institutional Professional Consult (permissible substitution): Payer: BC Managed Care – PPO | Admitting: Cardiology

## 2019-07-25 VITALS — BP 116/78 | HR 74 | Ht 63.0 in | Wt 179.0 lb

## 2019-07-25 DIAGNOSIS — I493 Ventricular premature depolarization: Secondary | ICD-10-CM

## 2019-07-25 NOTE — Patient Instructions (Signed)
Your physician wants you to follow-up in: 6 months with Dr. Rayann Heman.  You will receive a reminder letter in the mail two months in advance. If you don't receive a letter, please call our office to schedule the follow-up appointment.

## 2019-07-25 NOTE — Progress Notes (Signed)
Electrophysiology TeleHealth Note   Due to national recommendations of social distancing due to [place] 19, Audio telehealth visit is felt to be most appropriate for this patient at this time.  [name] was obtained today for patient consent regarding telehealth for Reginald Parker.   Date:  07/25/2019   ID:  Reginald Parker, DOB 24-Apr-1976, MRN [id number]  Location: home  Provider location: [place] Evaluation Performed: New patient consult  PCP:  Colon Branch, MD  Cardiologist:  Dr Geraldo Pitter Electrophysiologist:  None   Chief Complaint:  PVCs  History of Present Illness:    Reginald Parker is a 43 y.o. male who presents via audio/video conferencing for a telehealth visit today.   The patient is referred for new consultation regarding PVCs by Dr Geraldo Pitter.    The patient reports being in good health.  He has been found to have PVCs.  He does not recall that he has ever been found to have PVCs in the past.  These were discovered on routine ekg when he presented for evaluation of pneumonia in January by Dr Reginald Parker.  He was referred to Dr Geraldo Pitter.    He wore an event monitor which shows a relatively high PVC burden.  He is completely asymptomatic.  His energy is preserved.   He has rare postural dizziness if he gets up too quick.  Today, he denies symptoms of palpitations, chest pain, shortness of breath, orthopnea, PND, lower extremity edema, dizziness, presyncope, syncope, or neurologic sequela. The patient is tolerating medications without difficulties and is otherwise without complaint today.     Past Medical History:  Diagnosis Date  . Crohn's disease ([place])    universal colitis  . Elevated liver function tests   . Osteoporosis 06/01/2015  . Primary sclerosing cholangitis     Past Surgical History:  Procedure Laterality Date  . NASAL SINUS SURGERY  2012    Current Outpatient Medications  Medication Sig Dispense Refill  . albuterol (VENTOLIN HFA) 108 (90 Base)  MCG/ACT inhaler Inhale 2 puffs into the lungs every 6 (six) hours as needed for wheezing or shortness of breath. 18 g 2  . azelastine (ASTELIN) 0.1 % nasal spray Place 2 sprays into both nostrils 2 (two) times daily. 90 mL 3  . cetirizine (ZYRTEC) 10 MG tablet Take 10 mg by mouth daily.    Reginald Parker LIALDA 1.2 G EC tablet Take 4.8 g by mouth daily.  5  . Probiotic Product (ALIGN PO) Take 1 tablet by mouth 2 (two) times daily.    . ursodiol (ACTIGALL) 500 MG tablet Take 500 mg by mouth 2 (two) times daily.  5   No current facility-administered medications for this visit.     Allergies:   Ibuprofen   Social History:  The patient  reports that he has never smoked. He has never used smokeless tobacco. He reports current alcohol use. He reports that he does not use drugs.   Family History:  Denies FH of arrhythmia, sudden death syndromes    ROS:  Please see the history of present illness.   All other systems are personally reviewed and negative.    Exam:    Vital Signs:  BP 116/78   Pulse 74   Ht 5' 3"  (1.6 m)   Wt 179 lb (81.2 kg)   BMI 31.71 kg/m    Well sounding   Labs/Other Tests and Data Reviewed:    Recent Labs: 12/06/2018: Hemoglobin 15.5; Magnesium 2.0; Platelets 219.0 05/30/2019: ALT 46;  BUN 11; Creatinine, Ser 0.88; Potassium 4.1; Sodium 138   Wt Readings from Last 3 Encounters:  07/25/19 179 lb (81.2 kg)  05/30/19 179 lb 4 oz (81.3 kg)  05/20/19 180 lb (81.6 kg)     Other studies personally reviewed: Additional studies/ records that were reviewed today include: Prior event monitor, echo Review of the above records today demonstrates: normal echo  ASSESSMENT & PLAN:    1.  PVCs The patient has frequent PVCs.  He is completely asymptomatic.  No structural heart disease on echo.  No ischemic symptoms.  I have reviewed his ekg available in epic.  Unfortunately, I do not have a full 12 lead to review for morphologic location of the PVC.  On holter, the MDI is long,  suggesting possibly an epicardial source.  PVCs are at times closely coupled to preceeding T wave.    Therapeutic strategies for PVCs including medicine and ablation were discussed in detail with the patient today. Risk, benefits, and alternatives to each approach were discussed.  At this time, given paucity of symptoms, he is very clear that he would prefer "watchful waiting".  If he develops symptoms or clinical change then he may be more willing to consider ablation , beta blockers, or AAD therapy at that time.   We will continue to follow.  Return to me in 6 months unless problems arise in the interim.   Follow-up:  6 months with me  Current medicines are reviewed at length with the patient today.   The patient does not have concerns regarding his medicines.  The following changes were made today:  none  Labs/ tests ordered today include:  No orders of the defined types were placed in this encounter.   Patient Risk:  after full review of this patients clinical status, I feel that they are at moderate risk at this time.   Today, I have spent 15 minutes with the patient with telehealth technology discussing PVCs .    SignedThompson Grayer MD, [place] 07/25/2019 9:29 AM   [facility] [address] [place] Reginald Parker [address] [phone] (office) [phone] (fax)

## 2019-07-26 ENCOUNTER — Other Ambulatory Visit: Payer: Self-pay | Admitting: Internal Medicine

## 2019-11-15 ENCOUNTER — Telehealth: Payer: BC Managed Care – PPO | Admitting: Cardiology

## 2019-11-21 ENCOUNTER — Telehealth: Payer: BC Managed Care – PPO | Admitting: Cardiology

## 2019-12-13 ENCOUNTER — Ambulatory Visit: Payer: BC Managed Care – PPO | Admitting: Cardiology

## 2019-12-13 ENCOUNTER — Other Ambulatory Visit: Payer: Self-pay

## 2019-12-13 ENCOUNTER — Encounter: Payer: Self-pay | Admitting: Cardiology

## 2019-12-13 VITALS — BP 128/72 | HR 80 | Ht 63.0 in | Wt 173.0 lb

## 2019-12-13 DIAGNOSIS — K50919 Crohn's disease, unspecified, with unspecified complications: Secondary | ICD-10-CM

## 2019-12-13 DIAGNOSIS — I493 Ventricular premature depolarization: Secondary | ICD-10-CM | POA: Diagnosis not present

## 2019-12-13 NOTE — Patient Instructions (Signed)
Medication Instructions:  Your physician recommends that you continue on your current medications as directed. Please refer to the Current Medication list given to you today.  *If you need a refill on your cardiac medications before your next appointment, please call your pharmacy*  Lab Work: NONE If you have labs (blood work) drawn today and your tests are completely normal, you will receive your results only by: Marland Kitchen MyChart Message (if you have MyChart) OR . A paper copy in the mail If you have any lab test that is abnormal or we need to change your treatment, we will call you to review the results.  Testing/Procedures: You had an EKG performed today   Follow-Up: At [facility], you and your health needs are our priority.  As part of our continuing mission to provide you with exceptional heart care, we have created designated Provider Care Teams.  These Care Teams include your primary Cardiologist (physician) and Advanced Practice Providers (APPs -  Physician Assistants and Nurse Practitioners) who all work together to provide you with the care you need, when you need it.  Your next appointment:   6 month(s)  The format for your next appointment:   In Person  Provider:   Jyl Heinz, MD

## 2019-12-13 NOTE — Addendum Note (Signed)
Addended by: Margot Ables on: 12/13/2019 04:33 PM   Modules accepted: Orders

## 2019-12-13 NOTE — Progress Notes (Signed)
Cardiology Office Note:    Date:  12/13/2019   ID:  Reginald Parker, DOB 1976/05/25, MRN [id number]  PCP:  Colon Branch, MD  Cardiologist:  Jenean Lindau, MD   Referring MD: Colon Branch, MD    ASSESSMENT:    1. Frequent PVCs   2. Crohn's disease with complication, unspecified gastrointestinal tract location [facility])    PLAN:    In order of problems listed above:  1. I discussed my findings with the patient at extensive length and he vocalized understanding.  His EKG is fine today I did auscultation for a prolonged period of time and during this time there were no extra beat suggesting of PVCs.  He does not feel any of them nowadays he feels much better with his palpitation issues.  I reviewed electrophysiology visit notes and discussed with him.  He will be seen in follow-up appointment in 6 months or earlier if he has any concerns.  I told him to exercise at least 30 minutes a day 5 days a week.   Medication Adjustments/Labs and Tests Ordered: Current medicines are reviewed at length with the patient today.  Concerns regarding medicines are outlined above.  No orders of the defined types were placed in this encounter.  No orders of the defined types were placed in this encounter.    Chief Complaint  Patient presents with  . Follow-up    6 MO FU      History of Present Illness:    Reginald Parker is a 44 y.o. male.  Patient has past medical history of recurrent PVCs.  He has also been treated for Crohn's disease.  He denies any problems at this time and takes care of activities of daily living.  PND.  He is watching his diet well and exercising regularly.  At the time of my evaluation, the patient is alert awake oriented and in no distress.  Past Medical History:  Diagnosis Date  . Crohn's disease ([place])    universal colitis  . Elevated liver function tests   . Osteoporosis 06/01/2015  . Premature ventricular contractions   . Primary sclerosing cholangitis   .  Seasonal allergies     Past Surgical History:  Procedure Laterality Date  . NASAL SINUS SURGERY  2012    Current Medications: Current Meds  Medication Sig  . albuterol (VENTOLIN HFA) 108 (90 Base) MCG/ACT inhaler Inhale 2 puffs into the lungs every 6 (six) hours as needed for wheezing or shortness of breath.  Marland Kitchen azelastine (ASTELIN) 0.1 % nasal spray Place 2 sprays into both nostrils 2 (two) times daily.  . cetirizine (ZYRTEC) 10 MG tablet Take 10 mg by mouth daily.  Marland Kitchen LIALDA 1.2 G EC tablet Take 4.8 g by mouth daily.  . Probiotic Product (ALIGN PO) Take 1 tablet by mouth 2 (two) times daily.  . ursodiol (ACTIGALL) 500 MG tablet Take 500 mg by mouth 2 (two) times daily.     Allergies:   Ibuprofen   Social History   Socioeconomic History  . Marital status: Significant Other    Spouse name: Not on file  . Number of children: 2  . Years of education: Not on file  . Highest education level: Not on file  Occupational History  . Occupation: Dealer  Tobacco Use  . Smoking status: Never Smoker  . Smokeless tobacco: Never Used  Substance and Sexual Activity  . Alcohol use: Yes    Alcohol/week: 0.0 standard drinks    Comment: rarely  has a beer, nothing for the past year  . Drug use: No  . Sexual activity: Not on file  Other Topics Concern  . 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 Difficulty of Paying Living Expenses: Not on file  Food Insecurity:   . Worried About Charity fundraiser in the Last Year: Not on file  . Ran Out of Food in the Last Year: Not on file  Transportation Needs:   . Lack of Transportation (Medical): Not on file  . Lack of Transportation (Non-Medical): Not on file  Physical Activity:   . Days of Exercise per Week: Not on file  . Minutes  of Exercise per Session: Not on file  Stress:   . Feeling of Stress : Not on file  Social Connections:   . Frequency of Communication with Friends and Family: Not on file  . Frequency of Social Gatherings with Friends and Family: Not on file  . Attends Religious Services: Not on file  . Active Member of Clubs or Organizations: Not on file  . Attends Archivist Meetings: Not on file  . Marital Status: Not on file     Family History: The patient's family history is negative for Colon cancer, Prostate cancer, Diabetes, and CAD.  ROS:   Please see the history of present illness.    All other systems reviewed and are negative.  EKGs/Labs/Other Studies Reviewed:    The following studies were reviewed today: EKG reveals sinus rhythm and nonspecific ST-T changes.   Recent Labs: 05/30/2019: ALT 46; BUN 11; Creatinine, Ser 0.88; Potassium 4.1; Sodium 138  Recent Lipid Panel    Component Value Date/Time   CHOL 150 05/30/2019 0833   TRIG 119.0 05/30/2019 0833   HDL 41.80 05/30/2019 0833   CHOLHDL 4 05/30/2019 0833   VLDL 23.8 05/30/2019 0833   LDLCALC 85 05/30/2019 0833    Physical Exam:    VS:  BP 128/72   Pulse 80   Ht 5' 3"  (1.6 m)   Wt 173 lb (78.5 kg)   SpO2 99%   BMI 30.65 kg/m     Wt Readings from Last 3 Encounters:  12/13/19 173 lb (78.5 kg)  07/25/19 179 lb (81.2 kg)  05/30/19 179 lb 4 oz (81.3 kg)     GEN: Patient is in no acute distress HEENT: Normal NECK: No JVD; No carotid bruits LYMPHATICS: No lymphadenopathy CARDIAC: Hear sounds regular, 2/6 systolic murmur at the apex. RESPIRATORY:  Clear to auscultation without rales, wheezing or rhonchi  ABDOMEN: Soft, non-tender, non-distended MUSCULOSKELETAL:  No edema; No deformity  SKIN: Warm and dry NEUROLOGIC:  Alert and oriented x 3 PSYCHIATRIC:  Normal affect   Signed, Jenean Lindau, MD  12/13/2019 11:54 AM    [place]

## 2020-03-06 DIAGNOSIS — K5100 Ulcerative (chronic) pancolitis without complications: Secondary | ICD-10-CM | POA: Diagnosis not present

## 2020-03-06 DIAGNOSIS — K8301 Primary sclerosing cholangitis: Secondary | ICD-10-CM | POA: Diagnosis not present

## 2020-03-09 DIAGNOSIS — K5100 Ulcerative (chronic) pancolitis without complications: Secondary | ICD-10-CM | POA: Diagnosis not present

## 2020-03-09 DIAGNOSIS — K8301 Primary sclerosing cholangitis: Secondary | ICD-10-CM | POA: Diagnosis not present

## 2020-05-16 DIAGNOSIS — K8301 Primary sclerosing cholangitis: Secondary | ICD-10-CM | POA: Diagnosis not present

## 2020-06-04 ENCOUNTER — Ambulatory Visit (INDEPENDENT_AMBULATORY_CARE_PROVIDER_SITE_OTHER): Payer: BC Managed Care – PPO | Admitting: Internal Medicine

## 2020-06-04 ENCOUNTER — Other Ambulatory Visit: Payer: Self-pay

## 2020-06-04 ENCOUNTER — Encounter: Payer: Self-pay | Admitting: Internal Medicine

## 2020-06-04 VITALS — BP 123/77 | HR 67 | Temp 98.0°F | Resp 16 | Ht 63.0 in | Wt 175.0 lb

## 2020-06-04 DIAGNOSIS — Z0000 Encounter for general adult medical examination without abnormal findings: Secondary | ICD-10-CM

## 2020-06-04 LAB — LIPID PANEL
Cholesterol: 187 mg/dL (ref 0–200)
HDL: 49.2 mg/dL (ref >39.00)
LDL Cholesterol: 110 mg/dL — ABNORMAL HIGH (ref 0–99)
NonHDL: 137.65
Total CHOL/HDL Ratio: 4
Triglycerides: 140 mg/dL (ref 0.0–149.0)
VLDL: 28 mg/dL (ref 0.0–40.0)

## 2020-06-04 LAB — CBC WITH DIFFERENTIAL/PLATELET
Basophils Absolute: 0 10*3/uL (ref 0.0–0.1)
Basophils Relative: 0.6 % (ref 0.0–3.0)
Eosinophils Absolute: 0.2 10*3/uL (ref 0.0–0.7)
Eosinophils Relative: 3.4 % (ref 0.0–5.0)
HCT: 47.9 % (ref 39.0–52.0)
Hemoglobin: 16.4 g/dL (ref 13.0–17.0)
Lymphocytes Relative: 36 % (ref 12.0–46.0)
Lymphs Abs: 2.4 10*3/uL (ref 0.7–4.0)
MCHC: 34.1 g/dL (ref 30.0–36.0)
MCV: 93.5 fl (ref 78.0–100.0)
Monocytes Absolute: 0.7 10*3/uL (ref 0.1–1.0)
Monocytes Relative: 11 % (ref 3.0–12.0)
Neutro Abs: 3.3 10*3/uL (ref 1.4–7.7)
Neutrophils Relative %: 49 % (ref 43.0–77.0)
Platelets: 231 10*3/uL (ref 150.0–400.0)
RBC: 5.13 Mil/uL (ref 4.22–5.81)
RDW: 14.1 % (ref 11.5–15.5)
WBC: 6.7 10*3/uL (ref 4.0–10.5)

## 2020-06-04 LAB — BASIC METABOLIC PANEL
BUN: 11 mg/dL (ref 6–23)
CO2: 30 mEq/L (ref 19–32)
Calcium: 9.9 mg/dL (ref 8.4–10.5)
Chloride: 101 mEq/L (ref 96–112)
Creatinine, Ser: 0.88 mg/dL (ref 0.40–1.50)
GFR: 93.82 mL/min (ref >60.00)
Glucose, Bld: 95 mg/dL (ref 70–99)
Potassium: 4.9 mEq/L (ref 3.5–5.1)
Sodium: 138 mEq/L (ref 135–145)

## 2020-06-04 LAB — TSH: TSH: 1.22 u[IU]/mL (ref 0.35–4.50)

## 2020-06-04 LAB — HEMOGLOBIN A1C: Hgb A1c MFr Bld: 5.3 % (ref 4.6–6.5)

## 2020-06-04 NOTE — Patient Instructions (Addendum)
COVID-19 Vaccine Information can be found at: [email or web address].[email or web address] For questions related to vaccine distribution or appointments, please email [email or web address] or call [phone].     GO TO THE LAB : Get the blood work     [place], [place] Come back for for physical exam in 1 year

## 2020-06-04 NOTE — Progress Notes (Signed)
Subjective:    Patient ID: Reginald Parker, male    DOB: August 25, 1976, 44 y.o.   MRN: [id number]  DOS:  06/04/2020 Type of visit - description: CPX Since the last office visit he is doing well.  Cardiology note reviewed .   Review of Systems   A 14 point review of systems is negative    Past Medical History:  Diagnosis Date  . Crohn's disease ([place])    universal colitis  . Elevated liver function tests   . Osteoporosis 06/01/2015  . Premature ventricular contractions   . Primary sclerosing cholangitis   . Seasonal allergies     Past Surgical History:  Procedure Laterality Date  . NASAL SINUS SURGERY  2012    Allergies as of 06/04/2020      Reactions   Ibuprofen Other (See Comments)   Causes Heartburn      Medication List       Accurate as of June 04, 2020  8:10 AM. If you have any questions, ask your nurse or doctor.        albuterol 108 (90 Base) MCG/ACT inhaler Commonly known as: Ventolin HFA Inhale 2 puffs into the lungs every 6 (six) hours as needed for wheezing or shortness of breath.   ALIGN PO Take 1 tablet by mouth 2 (two) times daily.   azelastine 0.1 % nasal spray Commonly known as: ASTELIN Place 2 sprays into both nostrils 2 (two) times daily.   cetirizine 10 MG tablet Commonly known as: ZYRTEC Take 10 mg by mouth daily.   Lialda 1.2 g EC tablet Generic drug: mesalamine Take 4.8 g by mouth daily.   ursodiol 500 MG tablet Commonly known as: ACTIGALL Take 500 mg by mouth 2 (two) times daily.          Objective:   Physical Exam BP 123/77 (BP Location: Left Arm, Patient Position: Sitting, Cuff Size: Small)   Pulse 67   Temp 98 F (36.7 C) (Oral)   Resp 16   Ht 5' 3"  (1.6 m)   Wt 175 lb (79.4 kg)   SpO2 98%   BMI 31.00 kg/m  General: Well developed, NAD, BMI noted Neck: No  thyromegaly  HEENT:  Normocephalic . Face symmetric, atraumatic Lungs:  CTA B Normal respiratory effort, no intercostal retractions, no accessory muscle  use. Heart: RRR,  no murmur.  Abdomen:  Not distended, soft, non-tender. No rebound or rigidity.   Lower extremities: no pretibial edema bilaterally  Skin: Exposed areas without rash. Not pale. Not jaundice Neurologic:  alert & oriented X3.  Speech normal, gait appropriate for age and unassisted Strength symmetric and appropriate for age.  Psych: Cognition and judgment appear intact.  Cooperative with normal attention span and concentration.  Behavior appropriate. No anxious or depressed appearing.     Assessment       Assessment Crohn's disease, universal colitis, cscope 09-2014, Dr Michail Sermon Primary sclerosing cholangitis Osteoporosis: d/t steroids, saw endocrine>> no Rx unless has a fx or needs ongoing steroids Allergies, B-spasm (saw allergist remotely, was rx qvar) Frequent PVCs: Echo 2020 normal, saw cardiology, event monitor 28.8% PVCs.  Rx observation unless symptoms   PLAN Here for CPX Crohn's disease, PSC: Reports he sees GI regularly, recent LFTs normal.  No records. H/oh osteoporosis: Not on steroids, no need for treatment at this time Frequent PVCs: Asymptomatic, pulse today is regular. RTC 1 year  This visit occurred during the SARS-CoV-2 public health emergency.  Safety protocols were in place, including screening questions  prior to the visit, additional usage of staff PPE, and extensive cleaning of exam room while observing appropriate contact time as indicated for disinfecting solutions.

## 2020-06-04 NOTE — Progress Notes (Signed)
Pre visit review using our clinic review tool, if applicable. No additional management support is needed unless otherwise documented below in the visit note. 

## 2020-06-05 ENCOUNTER — Encounter: Payer: Self-pay | Admitting: Internal Medicine

## 2020-06-05 NOTE — Assessment & Plan Note (Signed)
Here for CPX Crohn's disease, PSC: Reports he sees GI regularly, recent LFTs normal.  No records. H/oh osteoporosis: Not on steroids, no need for treatment at this time Frequent PVCs: Asymptomatic, pulse today is regular. RTC 1 year

## 2020-06-05 NOTE — Assessment & Plan Note (Signed)
Td 2017;  PNM 23 2017; covid vaccine: hesitant pro>>cons d/w pt  Colon cancer screening:  per GI Prostate cancer screening: Not indicated Diet and exercise: active at work, take occ walk;  diet regular/kealthy Labs: BMP, FLP, CBC, A1c, TSH

## 2020-10-25 ENCOUNTER — Telehealth (INDEPENDENT_AMBULATORY_CARE_PROVIDER_SITE_OTHER): Payer: BC Managed Care – PPO | Admitting: Family Medicine

## 2020-10-25 ENCOUNTER — Other Ambulatory Visit: Payer: Self-pay

## 2020-10-25 ENCOUNTER — Encounter: Payer: Self-pay | Admitting: Family Medicine

## 2020-10-25 DIAGNOSIS — J069 Acute upper respiratory infection, unspecified: Secondary | ICD-10-CM | POA: Diagnosis not present

## 2020-10-25 MED ORDER — AZELASTINE HCL 0.1 % NA SOLN: 2.0000 | Freq: Two times a day (BID) | NASAL | 3 refills | Status: DC

## 2020-10-25 MED ORDER — PROMETHAZINE-DM 6.25-15 MG/5ML PO SYRP: 5.0000 mL | ORAL_SOLUTION | Freq: Four times a day (QID) | ORAL | 0 refills | Status: DC | PRN

## 2020-10-25 NOTE — Progress Notes (Signed)
Chief Complaint  Patient presents with  . Sore Throat    since yesterday, increased mucous dry air in home    [name] here for URI complaints. Due to COVID-19 pandemic, we are interacting via web portal for an electronic face-to-face visit. I verified patient's ID using 2 identifiers. Patient agreed to proceed with visit via this method. Patient is at home, I am at office. Patient and I are present for visit.   Duration: 2 days  Associated symptoms: sinus congestion, rhinorrhea, sore throat and cough Denies: sinus pain, ear pain, ear drainage, wheezing, shortness of breath, myalgia and fevers, N/V/D Treatment to date: INCS, Zyrtec, gargles, sinus headache med Sick contacts: Yes- son got better  Past Medical History:  Diagnosis Date  . Crohn's disease ([place])    universal colitis  . Elevated liver function tests   . Osteoporosis 06/01/2015  . Premature ventricular contractions   . Primary sclerosing cholangitis   . Seasonal allergies    Exam No conversational dyspnea Age appropriate judgment and insight Nml affect and mood  Viral URI with cough - Plan: azelastine (ASTELIN) 0.1 % nasal spray, promethazine-dextromethorphan (PROMETHAZINE-DM) 6.25-15 MG/5ML syrup  Warning about cough syrup causing drowsiness discussed. Add Astelin nasal spray. Cont INCS and Zyrtec.  Continue to push fluids, practice good hand hygiene, cover mouth when coughing. F/u prn. If starting to experience fevers, shaking, or shortness of breath, seek immediate care. Pt voiced understanding and agreement to the plan.  [place], DO 10/25/20 4:39 PM

## 2020-10-26 ENCOUNTER — Telehealth: Payer: Self-pay | Admitting: Internal Medicine

## 2020-10-26 ENCOUNTER — Telehealth: Payer: Self-pay

## 2020-10-26 NOTE — Telephone Encounter (Signed)
Pt was advised to call if not feeling better after visit with [place] on 10/25/20. Pt is requesting antibiotic be sent to pharmacy. Please advise.  [id number] [place] [place] [id number]

## 2020-10-26 NOTE — Telephone Encounter (Signed)
Patient states he is coughing green mucus and would like an antibiotic sent to pharm.

## 2020-10-27 MED ORDER — AZITHROMYCIN 250 MG PO TABS: ORAL_TABLET | ORAL | 0 refills | Status: DC

## 2020-10-27 NOTE — Telephone Encounter (Signed)
Plz reach out to pt and let him know I sent something in, but if he is getting better, he should not take it. Ty.

## 2020-10-27 NOTE — Addendum Note (Signed)
Addended by: Ames Coupe on: 10/27/2020 07:16 PM   Modules accepted: Orders

## 2020-10-29 NOTE — Telephone Encounter (Signed)
Called the patient and he did get the prescription. He will stop the medication because he is feeling much better.

## 2020-10-29 NOTE — Telephone Encounter (Signed)
Called left message to call back 

## 2021-01-25 DIAGNOSIS — Z20822 Contact with and (suspected) exposure to covid-19: Secondary | ICD-10-CM | POA: Diagnosis not present

## 2021-03-07 ENCOUNTER — Telehealth (INDEPENDENT_AMBULATORY_CARE_PROVIDER_SITE_OTHER): Payer: BC Managed Care – PPO | Admitting: Family Medicine

## 2021-03-07 DIAGNOSIS — R0981 Nasal congestion: Secondary | ICD-10-CM

## 2021-03-07 DIAGNOSIS — R059 Cough, unspecified: Secondary | ICD-10-CM | POA: Diagnosis not present

## 2021-03-07 MED ORDER — BENZONATATE 100 MG PO CAPS: 100.0000 mg | ORAL_CAPSULE | Freq: Three times a day (TID) | ORAL | 0 refills | Status: DC | PRN

## 2021-03-07 NOTE — Progress Notes (Signed)
Virtual Visit via Video Note  I connected with Reginald Parker  on 03/07/21 at 12:20 PM EDT by a video enabled telemedicine application and verified that I am speaking with the correct person using two identifiers.  Location patient: home, [place] Location provider:work or home office Persons participating in the virtual visit: patient, provider  I discussed the limitations of evaluation and management by telemedicine and the availability of in person appointments. The patient expressed understanding and agreed to proceed.   HPI:  Acute telemedicine visit for sinus issues: -Onset: 4 days ago -Symptoms include: started with sore throat, a little hoarseness, runny nose, pnd, cough, green mucus -doing better today -Denies: fevers, CP, SOB, body aches, vomiting, diarrhea, inability to eat/drink/get out of bed -Has tried: zyrtec -Pertinent past medical history: had covid19 last month -reports was treated with azithromycin and steroids by his cardiologist wife; denies history of asthma -but has used albuterol in the past when sick and this is on his medication list -Pertinent medication allergies: ibuprofen -COVID-19 vaccine status: vaccinated x2; has had flu shot   ROS: See pertinent positives and negatives per HPI.  Past Medical History:  Diagnosis Date  . Crohn's disease ([place])    universal colitis  . Elevated liver function tests   . Osteoporosis 06/01/2015  . Premature ventricular contractions   . Primary sclerosing cholangitis   . Seasonal allergies     Past Surgical History:  Procedure Laterality Date  . NASAL SINUS SURGERY  2012     Current Outpatient Medications:  .  benzonatate (TESSALON PERLES) 100 MG capsule, Take 1 capsule (100 mg total) by mouth 3 (three) times daily as needed., Disp: 20 capsule, Rfl: 0 .  albuterol (VENTOLIN HFA) 108 (90 Base) MCG/ACT inhaler, Inhale 2 puffs into the lungs every 6 (six) hours as needed for wheezing or shortness of breath., Disp: 18 g, Rfl: 5 .   azelastine (ASTELIN) 0.1 % nasal spray, Place 2 sprays into both nostrils 2 (two) times daily., Disp: 30 mL, Rfl: 3 .  azithromycin (ZITHROMAX) 250 MG tablet, Take 2 tabs the first day and then 1 tab daily until you run out., Disp: 6 tablet, Rfl: 0 .  cetirizine (ZYRTEC) 10 MG tablet, Take 10 mg by mouth daily., Disp: , Rfl:  .  LIALDA 1.2 G EC tablet, Take 4.8 g by mouth daily., Disp: , Rfl: 5 .  Probiotic Product (ALIGN PO), Take 1 tablet by mouth 2 (two) times daily., Disp: , Rfl:  .  promethazine-dextromethorphan (PROMETHAZINE-DM) 6.25-15 MG/5ML syrup, Take 5 mLs by mouth 4 (four) times daily as needed., Disp: 118 mL, Rfl: 0 .  ursodiol (ACTIGALL) 500 MG tablet, Take 500 mg by mouth 2 (two) times daily., Disp: , Rfl: 5  EXAM:  VITALS per patient if applicable:  GENERAL: alert, oriented, appears well and in no acute distress  HEENT: atraumatic, conjunttiva clear, no obvious abnormalities on inspection of external nose and ears  NECK: normal movements of the head and neck  LUNGS: on inspection no signs of respiratory distress, breathing rate appears normal, no obvious gross SOB, gasping or wheezing  CV: no obvious cyanosis  MS: moves all visible extremities without noticeable abnormality  PSYCH/NEURO: pleasant and cooperative, no obvious depression or anxiety, speech and thought processing grossly intact  ASSESSMENT AND PLAN:  Discussed the following assessment and plan:  Nasal congestion  Cough  -we discussed possible serious and likely etiologies, options for evaluation and workup, limitations of telemedicine visit vs in person visit, treatment, treatment  risks and precautions. Pt prefers to treat via telemedicine empirically rather than in person at this moment.  Query viral upper respiratory illness, allergic rhinitis versus other.  Opted for Tessalon for cough and other symptomatic care measures per patient instructions.  Discussed possibility of Covid and testing options,  but seems much less likely given vaccinated and had Covid a month ago.  Did advise to stay home while sick. Work/School slipped offered: declined Scheduled follow up with PCP offered: Agrees to schedule follow-up if needed. Advised to seek prompt in person care if worsening, new symptoms arise, or if is not improving with treatment. Discussed options for inperson care if PCP office not available. Did let this patient know that I only do telemedicine on Tuesdays and Thursdays for [place]. Advised to schedule follow up visit with PCP or [facility] if any further questions or concerns to avoid delays in care.   I discussed the assessment and treatment plan with the patient. The patient was provided an opportunity to ask questions and all were answered. The patient agreed with the plan and demonstrated an understanding of the instructions.     Lucretia Kern, DO

## 2021-03-07 NOTE — Patient Instructions (Signed)
  HOME CARE TIPS:   -I sent the medication(s) we discussed to your pharmacy: Meds ordered this encounter  Medications  . benzonatate (TESSALON PERLES) 100 MG capsule    Sig: Take 1 capsule (100 mg total) by mouth 3 (three) times daily as needed.    Dispense:  20 capsule    Refill:  0     -can use tylenol if needed for fevers, aches and pains per instructions  -can use nasal saline a few times per day if you have nasal congestion; sometimes  a short course of Afrin nasal spray for 3 days can help with symptoms as well  -stay hydrated, drink plenty of fluids and eat small healthy meals - avoid dairy  -can take 1000 IU (44mg) Vit D3 and 100-500 mg of Vit C daily per instructions  -follow up with your doctor in 2-3 days unless improving and feeling better  -stay home while sick, except to seek medical care  It was nice to meet you today, and I really hope you are feeling better soon. I help [facility] out with telemedicine visits on Tuesdays and Thursdays and am available for visits on those days. If you have any concerns or questions following this visit please schedule a follow up visit with your Primary Care doctor or seek care at a local urgent care clinic to avoid delays in care.    Seek in person care or schedule a follow up video visit promptly if your symptoms worsen, new concerns arise or you are not improving with treatment. Call 911 and/or seek emergency care if your symptoms are severe or life threatening.

## 2021-03-19 DIAGNOSIS — K5100 Ulcerative (chronic) pancolitis without complications: Secondary | ICD-10-CM | POA: Diagnosis not present

## 2021-03-25 LAB — COMPREHENSIVE METABOLIC PANEL
Albumin: 3.7 (ref 3.5–5.0)
Calcium: 9.1 (ref 8.7–10.7)
GFR calc non Af Amer: 115

## 2021-03-25 LAB — BASIC METABOLIC PANEL
BUN: 14 (ref 4–21)
CO2: 31 — AB (ref 13–22)
Chloride: 102 (ref 99–108)
Creatinine: 0.7 (ref 0.6–1.3)
Glucose: 123
Potassium: 3.5 (ref 3.4–5.3)
Sodium: 139 (ref 137–147)

## 2021-03-25 LAB — CBC AND DIFFERENTIAL
HCT: 36 — AB (ref 41–53)
Hemoglobin: 11.7 — AB (ref 13.5–17.5)
Neutrophils Absolute: 7.3
Platelets: 367 (ref 150–399)
WBC: 12.9

## 2021-03-25 LAB — HEPATIC FUNCTION PANEL
ALT: 22 (ref 10–40)
AST: 15 (ref 14–40)
Alkaline Phosphatase: 129 — AB (ref 25–125)

## 2021-03-25 LAB — CBC: RBC: 3.97 (ref 3.87–5.11)

## 2021-03-27 DIAGNOSIS — K8301 Primary sclerosing cholangitis: Secondary | ICD-10-CM | POA: Diagnosis not present

## 2021-03-27 DIAGNOSIS — K5100 Ulcerative (chronic) pancolitis without complications: Secondary | ICD-10-CM | POA: Diagnosis not present

## 2021-06-04 DIAGNOSIS — K5100 Ulcerative (chronic) pancolitis without complications: Secondary | ICD-10-CM | POA: Diagnosis not present

## 2021-06-05 ENCOUNTER — Encounter: Payer: BC Managed Care – PPO | Admitting: Internal Medicine

## 2021-06-11 ENCOUNTER — Other Ambulatory Visit: Payer: Self-pay

## 2021-06-11 ENCOUNTER — Encounter: Payer: Self-pay | Admitting: Internal Medicine

## 2021-06-11 ENCOUNTER — Ambulatory Visit (INDEPENDENT_AMBULATORY_CARE_PROVIDER_SITE_OTHER): Payer: BC Managed Care – PPO | Admitting: Internal Medicine

## 2021-06-11 VITALS — BP 132/78 | HR 74 | Temp 97.9°F | Resp 18 | Ht 66.0 in | Wt 179.8 lb

## 2021-06-11 DIAGNOSIS — Z1159 Encounter for screening for other viral diseases: Secondary | ICD-10-CM | POA: Diagnosis not present

## 2021-06-11 DIAGNOSIS — Z0000 Encounter for general adult medical examination without abnormal findings: Secondary | ICD-10-CM | POA: Diagnosis not present

## 2021-06-11 LAB — LIPID PANEL
Cholesterol: 144 mg/dL (ref 0–200)
HDL: 60.2 mg/dL (ref >39.00)
LDL Cholesterol: 66 mg/dL (ref 0–99)
NonHDL: 84.1
Total CHOL/HDL Ratio: 2
Triglycerides: 92 mg/dL (ref 0.0–149.0)
VLDL: 18.4 mg/dL (ref 0.0–40.0)

## 2021-06-11 LAB — HEMOGLOBIN A1C: Hgb A1c MFr Bld: 6.3 % (ref 4.6–6.5)

## 2021-06-11 LAB — TSH: TSH: 0.5 u[IU]/mL (ref 0.35–5.50)

## 2021-06-11 NOTE — Progress Notes (Signed)
Subjective:    Patient ID: Reginald Parker, male    DOB: 11-Dec-1975, 45 y.o.   MRN: [id number]  DOS:  06/11/2021 Type of visit - description: CPX  Here for CPX. -Was diagnosed with COVID in March, he feels recuperated. -GI symptoms resurface in April, has been managed by GI.  Overall feels better.   Review of Systems Denies fever chills. No chest pain no difficulty breathing.  Other than above, a 14 point review of systems is negative      Past Medical History:  Diagnosis Date   Crohn's disease ([place])    universal colitis   Elevated liver function tests    Osteoporosis 06/01/2015   Premature ventricular contractions    Primary sclerosing cholangitis    Seasonal allergies     Past Surgical History:  Procedure Laterality Date   NASAL SINUS SURGERY  2012   Social History   Socioeconomic History   Marital status: Significant Other    Spouse name: Not on file   Number of children: 2   Years of education: Not on file   Highest education level: Not on file  Occupational History   Occupation: Dealer  Tobacco Use   Smoking status: Never   Smokeless tobacco: Never  Substance and Sexual Activity   Alcohol use: Yes    Alcohol/week: 0.0 standard drinks    Comment: rarely has a beer, nothing for the past year   Drug use: No   Sexual activity: Not on file  Other Topics Concern   Not on file  Social History Narrative   Original from [place] w/ girlfriend in [place] 2018   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 If you have any questions, ask your nurse or doctor.          STOP taking these medications    azithromycin 250 MG tablet Commonly known as: ZITHROMAX Stopped by: Kathlene November, MD   benzonatate 100 MG capsule Commonly known as: Best boy Stopped by: Kathlene November, MD   promethazine-dextromethorphan 6.25-15 MG/5ML syrup Commonly known as: PROMETHAZINE-DM Stopped by: Kathlene November, MD       TAKE these medications    albuterol 108 (90 Base) MCG/ACT inhaler Commonly known as: Ventolin HFA Inhale 2 puffs into the lungs every 6 (six) hours as needed for wheezing or shortness of breath.   ALIGN PO Take 1 tablet by mouth 2 (two) times daily.   azelastine 0.1 % nasal spray Commonly known as: ASTELIN Place 2 sprays into both nostrils 2 (two) times daily.   cetirizine 10 MG tablet Commonly known as: ZYRTEC Take 10 mg by mouth daily.   hydrocortisone 100 MG/60ML enema Commonly known as: CORTENEMA SMARTSIG:60 Milliliter(s) Rectally Every Night   Lialda 1.2 g EC tablet Generic drug: mesalamine Take 4.8 g by mouth daily.   predniSONE 10 MG tablet Commonly known as: DELTASONE Take  20 mg by mouth daily.   ursodiol 500 MG tablet Commonly known as: ACTIGALL Take 500 mg by mouth 2 (two) times daily.           Objective:   Physical Exam BP 132/78 (BP Location: Left Arm, Patient Position: Sitting, Cuff Size: Normal)   Pulse 74   Temp 97.9 F (36.6 C) (Oral)   Resp 18   Ht 5' 6"  (1.676 m)   Wt 179 lb 12.8 oz (81.6 kg)   SpO2 100%   BMI 29.02 kg/m  General: Well developed, NAD, BMI noted Neck: No  thyromegaly  HEENT:  Normocephalic . Face symmetric, atraumatic Lungs:  CTA B Normal respiratory effort, no intercostal retractions, no accessory muscle use. Heart: RRR,  no murmur.  Abdomen:  Not distended, soft, non-tender. No rebound or rigidity.   Lower extremities: no pretibial edema bilaterally  Skin: Exposed areas without  rash. Not pale. Not jaundice Neurologic:  alert & oriented X3.  Speech normal, gait appropriate for age and unassisted Strength symmetric and appropriate for age.  Psych: Cognition and judgment appear intact.  Cooperative with normal attention span and concentration.  Behavior appropriate. No anxious or depressed appearing.     Assessment     Assessment Crohn's disease, universal colitis, cscope 09-2014, Dr Michail Sermon Primary sclerosing cholangitis Osteoporosis: d/t steroids, saw endocrine>> no Rx unless has a fx or needs ongoing steroids Allergies, B-spasm (saw allergist remotely, was rx qvar) Frequent PVCs: Echo 2020 normal, saw cardiology, event monitor 28.8% PVCs.  Rx observation unless symptoms Covid infex 01-2021  PLAN Here for CPX Ulcerative colitis, primary sclerosing cholangitis: Symptoms exacerbated since April, started prednisone 20 mg approximately 6 weeks ago per patient.  Also started steroid enema about a week ago.  Symptoms are improving. Was recommended to have a colonoscopy, planned for ~ September 2022. Plan: Get records from GI. Osteoporosis: Reassess in few months.  Currently on steroids, do not know for how long, reassess on RTC RTC 4 months    This visit occurred during the SARS-CoV-2 public health emergency.  Safety protocols were in place, including screening questions prior to the visit, additional usage of staff PPE, and extensive cleaning of exam room while observing appropriate contact time as indicated for disinfecting solutions.

## 2021-06-11 NOTE — Patient Instructions (Signed)
  GO TO THE LAB : Get the blood work     [place], [place] back for a checkup in 4 months

## 2021-06-12 ENCOUNTER — Encounter: Payer: Self-pay | Admitting: Internal Medicine

## 2021-06-12 LAB — HEPATITIS C ANTIBODY
Hepatitis C Ab: NONREACTIVE
SIGNAL TO CUT-OFF: 0.01 (ref <1.00)

## 2021-06-12 NOTE — Assessment & Plan Note (Signed)
-  Td 2017   -PNM 23 2017 - covid vax x 2, booster rec - Patient is currently immunosuppressed, on prednisone 20 mg daily, discussed with GI via message, no contraindications for EVUSHELD.  Will arrange.. -Colon cancer screening:  per GI, reports a cscope is planned for ~ 07-2021 Prostate cancer screening: Not indicated Diet and exercise: Counseled. Labs: Reports he had recent labs at GI.  We will check FLP, TSH, A1c, hep C

## 2021-06-12 NOTE — Assessment & Plan Note (Signed)
Here for CPX Ulcerative colitis, primary sclerosing cholangitis: Symptoms exacerbated since April, started prednisone 20 mg approximately 6 weeks ago per patient.  Also started steroid enema about a week ago.  Symptoms are improving. Was recommended to have a colonoscopy, planned for ~ September 2022. Plan: Get records from GI. Osteoporosis: Reassess in few months.  Currently on steroids, do not know for how long, reassess on RTC RTC 4 months

## 2021-06-13 ENCOUNTER — Other Ambulatory Visit: Payer: Self-pay

## 2021-06-13 DIAGNOSIS — D849 Immunodeficiency, unspecified: Secondary | ICD-10-CM

## 2021-06-13 DIAGNOSIS — K50919 Crohn's disease, unspecified, with unspecified complications: Secondary | ICD-10-CM

## 2021-06-14 ENCOUNTER — Ambulatory Visit (INDEPENDENT_AMBULATORY_CARE_PROVIDER_SITE_OTHER): Payer: BC Managed Care – PPO | Admitting: *Deleted

## 2021-06-14 ENCOUNTER — Other Ambulatory Visit: Payer: Self-pay

## 2021-06-14 DIAGNOSIS — D849 Immunodeficiency, unspecified: Secondary | ICD-10-CM

## 2021-06-14 DIAGNOSIS — K50919 Crohn's disease, unspecified, with unspecified complications: Secondary | ICD-10-CM

## 2021-06-14 DIAGNOSIS — Z298 Encounter for other specified prophylactic measures: Secondary | ICD-10-CM

## 2021-06-14 MED ORDER — EPINEPHRINE 0.3 MG/0.3ML IJ SOAJ: 0.3000 mg | Freq: Once | INTRAMUSCULAR | Status: DC | PRN

## 2021-06-14 MED ORDER — ALBUTEROL SULFATE HFA 108 (90 BASE) MCG/ACT IN AERS: 2.0000 | INHALATION_SPRAY | Freq: Once | RESPIRATORY_TRACT | Status: DC | PRN

## 2021-06-14 MED ORDER — TIXAGEVIMAB (PART OF EVUSHELD) INJECTION
300.0000 mg | Freq: Once | INTRAMUSCULAR | Status: AC
  Administered 2021-06-14: 300 mg via INTRAMUSCULAR

## 2021-06-14 MED ORDER — METHYLPREDNISOLONE SODIUM SUCC 125 MG IJ SOLR: 125.0000 mg | Freq: Once | INTRAMUSCULAR | Status: DC | PRN

## 2021-06-14 MED ORDER — CILGAVIMAB (PART OF EVUSHELD) INJECTION
300.0000 mg | Freq: Once | INTRAMUSCULAR | Status: AC
  Administered 2021-06-14: 300 mg via INTRAMUSCULAR

## 2021-06-14 MED ORDER — DIPHENHYDRAMINE HCL 50 MG/ML IJ SOLN: 50.0000 mg | Freq: Once | INTRAMUSCULAR | Status: DC | PRN

## 2021-06-14 NOTE — Progress Notes (Signed)
Diagnosis: Pre-COVID Exposure Prophylaxis  Provider:  Marshell Garfinkel, MD  Procedure: Injection  evusheld, Dose: 300 mg, Site: intramuscular 1 right gluteal , 1 left gluteal    Discharge: Condition: Good, Destination: Home . AVS provided to patient.   Performed by:  Koren Shiver, RN, BSN

## 2021-06-19 ENCOUNTER — Ambulatory Visit: Payer: BC Managed Care – PPO | Admitting: Internal Medicine

## 2021-06-19 ENCOUNTER — Encounter: Payer: Self-pay | Admitting: Internal Medicine

## 2021-06-19 ENCOUNTER — Other Ambulatory Visit: Payer: Self-pay

## 2021-06-19 DIAGNOSIS — Z227 Latent tuberculosis: Secondary | ICD-10-CM

## 2021-06-19 NOTE — Progress Notes (Signed)
Patient notified of receipt of HD notes. Per provider review, successfully treated for LTBI. No follow up needed. Patient requested note be sent to his provider managing his Crohns, Dr. Michail Sermon with GI.   Joesphine Schemm Lorita Officer, RN

## 2021-06-19 NOTE — Progress Notes (Signed)
Patient ID: Reginald Parker, male   DOB: 07/21/1976, 45 y.o.   MRN: [id number]    [facility]      Reason for Consult:latent Tb    Referring Physician: Dr. Michail Sermon    Patient ID: Reginald Parker, male    DOB: 1976-05-10, 45 y.o.   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   He has a history of ulcerative colitis and progressive sclerosing cholangitis and followed by Dr. Michail Sermon.  He had been relatively in remission but after a bout with COVID in the spring he has become more symptomatic.  He was placed on steroids and stable now at this time.  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  He was previously tested in 2013 when he went to a pulmonologist and had a positive ppd and was sent to the [facility] for treatment.   CXR in 2020 with some scarring, no active disease.  He is having no cough, no shortness of breath, no hemoptysis, no weight loss or night sweats.   Records received from the health department at [facility] and he completed 4 months of rifampin treatment   Past Medical History:  Diagnosis Date   Crohn's disease ([place])    universal colitis   Elevated liver function tests    Osteoporosis 06/01/2015   Premature ventricular contractions    Primary sclerosing cholangitis    Seasonal allergies     Prior to Admission medications   Medication Sig Start Date End Date Taking? Authorizing Provider  albuterol (VENTOLIN HFA) 108 (90 Base) MCG/ACT inhaler Inhale 2 puffs into the lungs every 6 (six) hours as needed for wheezing or shortness of breath. 07/26/19  Yes Paz, Alda Berthold, MD  azelastine (ASTELIN) 0.1 % nasal spray Place 2 sprays into both nostrils 2 (two) times daily. 10/25/20  Yes Shelda Pal, DO  cetirizine (ZYRTEC) 10 MG tablet Take 10 mg by mouth daily.   Yes [provider]  LIALDA 1.2 G EC tablet Take 4.8 g  by mouth daily. 05/04/15  Yes [provider]  predniSONE (DELTASONE) 10 MG tablet Take 20 mg by mouth daily. 05/30/21  Yes [provider]  Probiotic Product (ALIGN PO) Take 1 tablet by mouth 2 (two) times daily.   Yes [provider]  ursodiol (ACTIGALL) 500 MG tablet Take 500 mg by mouth 2 (two) times daily. 05/15/15  Yes [provider]  hydrocortisone (CORTENEMA) 100 MG/60ML enema SMARTSIG:60 Milliliter(s) Rectally Every Night 06/03/21   [provider]    Allergies  Allergen Reactions   Ibuprofen Other (See Comments)    Causes Heartburn    Social History   Tobacco Use   Smoking status: Never   Smokeless tobacco: Never  Substance Use Topics   Alcohol use: Yes    Alcohol/week: 0.0 standard drinks    Comment: rarely has a beer, nothing for the past year   Drug use: No    Family History  Problem Relation Age of Onset   Diabetes Father 31   Colon cancer Neg Hx    Prostate cancer Neg Hx    CAD Neg Hx      Review of Systems  Constitutional: negative for fevers, chills, sweats, fatigue, malaise, and anorexia Respiratory: negative for cough, sputum, hemoptysis, pleurisy/chest pain, pneumonia, or dyspnea on exertion Gastrointestinal: negative for nausea and  diarrhea Integument/breast: negative for rash Hematologic/lymphatic: negative for lymphadenopathy All other systems reviewed and are negative    Constitutional: in no apparent distress  Vitals:   06/19/21 0855  BP: 125/76  Pulse: 77   EYES: anicteric ENMT: no thrush Respiratory: clear Musculoskeletal: no pedal edema noted Skin: negatives: no rash Neuro: non-focal  Labs: Lab Results  Component Value Date   WBC 6.7 06/04/2020   HGB 16.4 06/04/2020   HCT 47.9 06/04/2020   MCV 93.5 06/04/2020   PLT 231.0 06/04/2020    Lab Results  Component Value Date   CREATININE 0.88 06/04/2020   BUN 11 06/04/2020   NA 138 06/04/2020   K 4.9 06/04/2020   CL 101 06/04/2020    CO2 30 06/04/2020    Lab Results  Component Value Date   ALT 46 05/30/2019   AST 31 05/30/2019   ALKPHOS 155 (H) 05/30/2019   BILITOT 2.2 (H) 05/30/2019   INR 1.0 08/20/2007     Assessment:  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  I discussed latent Tb with him, treatment options and risk of disease after latent Tb treatment.  I discussed that his ppd and/or Quantiferon test will remain positive, likely lifelong and repeat testing not indicated.  I discussed the percentages of eradication with latent Tb treatment of 70-90% and that retreatment not indicated with no evidence of any benefit.   Plan: 1)  no indication for retreatment; ok for biologics as needed

## 2021-06-21 ENCOUNTER — Encounter: Payer: Self-pay | Admitting: Internal Medicine

## 2021-08-09 DIAGNOSIS — K5100 Ulcerative (chronic) pancolitis without complications: Secondary | ICD-10-CM | POA: Diagnosis not present

## 2021-08-09 DIAGNOSIS — K5289 Other specified noninfective gastroenteritis and colitis: Secondary | ICD-10-CM | POA: Diagnosis not present

## 2021-08-09 DIAGNOSIS — K6389 Other specified diseases of intestine: Secondary | ICD-10-CM | POA: Diagnosis not present

## 2021-08-09 DIAGNOSIS — K529 Noninfective gastroenteritis and colitis, unspecified: Secondary | ICD-10-CM | POA: Diagnosis not present

## 2021-08-09 DIAGNOSIS — K640 First degree hemorrhoids: Secondary | ICD-10-CM | POA: Diagnosis not present

## 2021-08-09 DIAGNOSIS — K633 Ulcer of intestine: Secondary | ICD-10-CM | POA: Diagnosis not present

## 2021-08-27 ENCOUNTER — Other Ambulatory Visit: Payer: Self-pay

## 2021-08-27 ENCOUNTER — Ambulatory Visit (INDEPENDENT_AMBULATORY_CARE_PROVIDER_SITE_OTHER): Payer: BC Managed Care – PPO | Admitting: Family

## 2021-08-27 VITALS — BP 122/76 | HR 113 | Temp 98.1°F | Resp 18 | Ht 66.0 in | Wt 171.8 lb

## 2021-08-27 DIAGNOSIS — M79672 Pain in left foot: Secondary | ICD-10-CM | POA: Insufficient documentation

## 2021-08-27 MED ORDER — DICLOFENAC SODIUM 75 MG PO TBEC: 75.0000 mg | DELAYED_RELEASE_TABLET | Freq: Two times a day (BID) | ORAL | 0 refills | Status: AC

## 2021-08-27 MED ORDER — DICLOFENAC SODIUM 1 % EX GEL: 1.0000 "application " | Freq: Four times a day (QID) | CUTANEOUS | Status: AC

## 2021-08-27 NOTE — Patient Instructions (Addendum)
Perform exercises attached as able. Do NOT perform any exercise if it increases your pain. Apply ice for 20 min. Three times a day to bottom of foot and heel as tolerated. Pick up meds sent to your pharmacy to help with the pain. Try orthotic insoles, e.g. Superfeet or Dr. Felicie Morn brand. A referral has been sent to the podiatrist and they will call you to schedule an appointment.

## 2021-08-27 NOTE — Assessment & Plan Note (Addendum)
Advised on exercises, apply ice tid, and starting trial of Diclofenac Sodium oral and topical. Try orthotic insoles or wait until podiatry appt.

## 2021-08-27 NOTE — Progress Notes (Signed)
Acute Office Visit  Subjective:    Patient ID: Reginald Parker, male    DOB: November 18, 1976, 45 y.o.   MRN: [id number]  Chief Complaint  Patient presents with   left foot pain    Started Saturday morning. He has swelling, pain near the arch of the foot. Pain now radiates up into the leg/ calf muscle.     HPI Patient is in today for pain and swelling in his right foot. Woke up with swelling on Saturday and Sunday woke with a lot of pain in arch of foot around heel. Denies any injury or having same pain in the past. 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   Outpatient Medications Prior to Visit  Medication Sig Dispense Refill   albuterol (VENTOLIN HFA) 108 (90 Base) MCG/ACT inhaler Inhale 2 puffs into the lungs every 6 (six) hours as needed for wheezing or shortness of breath. 18 g 5   azelastine (ASTELIN) 0.1 % nasal spray Place 2 sprays into both nostrils 2 (two) times daily. 30 mL 3   cetirizine (ZYRTEC) 10 MG tablet Take 10 mg by mouth daily.     hydrocortisone (CORTENEMA) 100 MG/60ML enema SMARTSIG:60 Milliliter(s) Rectally Every Night     LIALDA 1.2 G EC tablet Take 4.8 g by mouth daily.  5   predniSONE (DELTASONE) 10 MG tablet Take 20 mg by mouth daily.     Probiotic Product (ALIGN PO) Take 1 tablet by mouth 2 (two) times daily.     ursodiol (ACTIGALL) 500 MG tablet Take 500 mg by mouth 2 (two) times daily.  5   Facility-Administered Medications Prior to Visit  Medication Dose Route Frequency Provider Last Rate Last Admin   albuterol (VENTOLIN HFA) 108 (90 Base) MCG/ACT inhaler 2 puff  2 puff Inhalation Once PRN Colon Branch, MD       diphenhydrAMINE (BENADRYL) injection 50 mg  50 mg Intramuscular Once PRN Colon Branch, MD       EPINEPHrine (EPI-PEN) injection 0.3 mg  0.3 mg Intramuscular Once PRN Colon Branch, MD       methylPREDNISolone sodium succinate (SOLU-MEDROL) 125 mg/2 mL injection 125 mg  125 mg Intramuscular Once PRN Colon Branch, MD         Allergies  Allergen Reactions   Ibuprofen Other (See Comments)    Causes Heartburn    Review of Systems See HPI above.     Objective:    Physical Exam Vitals and nursing note reviewed.  Constitutional:      Appearance: Normal appearance.  Cardiovascular:     Rate and Rhythm: Normal rate and regular rhythm.     Pulses:          Dorsalis pedis pulses are 2+ on the left side.  Pulmonary:     Effort: Pulmonary effort is normal.     Breath sounds: Normal breath sounds.  Feet:     Left foot:     Skin integrity: No skin breakdown, erythema or warmth.     Comments: Mild swelling noted on medial ankle and arch. Psychiatric:        Mood and Affect: Mood normal.   BP 122/76 (BP Location: Left Arm, Patient Position: Sitting, Cuff Size: Normal)   Pulse (!) 113   Temp 98.1 F (36.7 C) (Oral)   Resp 18   Ht 5' 6"  (1.676 m)   Wt 171 lb 12.8 oz (77.9 kg)   SpO2 98%  BMI 27.73 kg/m  Wt Readings from Last 3 Encounters:  08/27/21 171 lb 12.8 oz (77.9 kg)  06/19/21 181 lb 6.4 oz (82.3 kg)  06/11/21 179 lb 12.8 oz (81.6 kg)    Health Maintenance Due  Topic Date Due   COVID-19 Vaccine (3 - Pfizer risk series) 02/16/2021   INFLUENZA VACCINE  06/24/2021    There are no preventive care reminders to display for this patient.       Assessment & Plan:   Problem List Items Addressed This Visit       Other   Foot pain, left - Primary    Advised on exercises, apply ice tid, and starting trial of Diclofenac Sodium oral and topical. Try orthotic insoles or wait until podiatry appt.      Relevant Medications   diclofenac (VOLTAREN) 75 MG EC tablet   diclofenac Sodium (VOLTAREN) 1 % topical gel 1 application   Other Relevant Orders   Ambulatory referral to Podiatry     Meds ordered this encounter  Medications   diclofenac (VOLTAREN) 75 MG EC tablet    Sig: Take 1 tablet (75 mg total) by mouth 2 (two) times daily for 7 days. Take AFTER eating.    Dispense:  14 tablet     Refill:  0    Order Specific Question:   Supervising Provider    Answer:   ANDY, CAMILLE L [[id number]]   diclofenac Sodium (VOLTAREN) 1 % topical gel 1 application     Jeanie Sewer, NP

## 2021-09-02 DIAGNOSIS — A09 Infectious gastroenteritis and colitis, unspecified: Secondary | ICD-10-CM | POA: Diagnosis not present

## 2021-09-02 DIAGNOSIS — K5100 Ulcerative (chronic) pancolitis without complications: Secondary | ICD-10-CM | POA: Diagnosis not present

## 2021-09-03 ENCOUNTER — Inpatient Hospital Stay (HOSPITAL_BASED_OUTPATIENT_CLINIC_OR_DEPARTMENT_OTHER)
Admission: EM | Admit: 2021-09-03 | Discharge: 2021-09-08 | DRG: 385 | Disposition: A | Discharge status: Home / Self Care | Payer: BC Managed Care – PPO | Attending: Internal Medicine | Admitting: Internal Medicine

## 2021-09-03 ENCOUNTER — Ambulatory Visit (INDEPENDENT_AMBULATORY_CARE_PROVIDER_SITE_OTHER): Payer: BC Managed Care – PPO

## 2021-09-03 ENCOUNTER — Ambulatory Visit (HOSPITAL_BASED_OUTPATIENT_CLINIC_OR_DEPARTMENT_OTHER)
Admission: RE | Admit: 2021-09-03 | Discharge: 2021-09-03 | Disposition: A | Discharge status: Home / Self Care | Payer: BC Managed Care – PPO | Source: Ambulatory Visit | Attending: Podiatry | Admitting: Podiatry

## 2021-09-03 ENCOUNTER — Other Ambulatory Visit: Payer: Self-pay

## 2021-09-03 ENCOUNTER — Encounter: Payer: Self-pay | Admitting: Podiatry

## 2021-09-03 ENCOUNTER — Ambulatory Visit: Payer: BC Managed Care – PPO | Admitting: Podiatry

## 2021-09-03 ENCOUNTER — Other Ambulatory Visit (HOSPITAL_BASED_OUTPATIENT_CLINIC_OR_DEPARTMENT_OTHER): Payer: Self-pay | Admitting: Podiatry

## 2021-09-03 ENCOUNTER — Emergency Department (HOSPITAL_BASED_OUTPATIENT_CLINIC_OR_DEPARTMENT_OTHER): Payer: BC Managed Care – PPO

## 2021-09-03 ENCOUNTER — Encounter (HOSPITAL_BASED_OUTPATIENT_CLINIC_OR_DEPARTMENT_OTHER): Payer: Self-pay

## 2021-09-03 DIAGNOSIS — Z886 Allergy status to analgesic agent status: Secondary | ICD-10-CM | POA: Diagnosis not present

## 2021-09-03 DIAGNOSIS — I82402 Acute embolism and thrombosis of unspecified deep veins of left lower extremity: Secondary | ICD-10-CM | POA: Diagnosis present

## 2021-09-03 DIAGNOSIS — M722 Plantar fascial fibromatosis: Secondary | ICD-10-CM

## 2021-09-03 DIAGNOSIS — M79672 Pain in left foot: Secondary | ICD-10-CM

## 2021-09-03 DIAGNOSIS — D649 Anemia, unspecified: Secondary | ICD-10-CM | POA: Diagnosis present

## 2021-09-03 DIAGNOSIS — K5100 Ulcerative (chronic) pancolitis without complications: Secondary | ICD-10-CM | POA: Diagnosis present

## 2021-09-03 DIAGNOSIS — G8929 Other chronic pain: Secondary | ICD-10-CM

## 2021-09-03 DIAGNOSIS — R609 Edema, unspecified: Secondary | ICD-10-CM

## 2021-09-03 DIAGNOSIS — K219 Gastro-esophageal reflux disease without esophagitis: Secondary | ICD-10-CM | POA: Diagnosis not present

## 2021-09-03 DIAGNOSIS — D72829 Elevated white blood cell count, unspecified: Secondary | ICD-10-CM | POA: Diagnosis present

## 2021-09-03 DIAGNOSIS — M810 Age-related osteoporosis without current pathological fracture: Secondary | ICD-10-CM | POA: Diagnosis not present

## 2021-09-03 DIAGNOSIS — Z20822 Contact with and (suspected) exposure to covid-19: Secondary | ICD-10-CM | POA: Diagnosis present

## 2021-09-03 DIAGNOSIS — K5090 Crohn's disease, unspecified, without complications: Secondary | ICD-10-CM | POA: Diagnosis present

## 2021-09-03 DIAGNOSIS — K625 Hemorrhage of anus and rectum: Secondary | ICD-10-CM | POA: Diagnosis not present

## 2021-09-03 DIAGNOSIS — I82462 Acute embolism and thrombosis of left calf muscular vein: Secondary | ICD-10-CM | POA: Diagnosis present

## 2021-09-03 DIAGNOSIS — J9811 Atelectasis: Secondary | ICD-10-CM | POA: Diagnosis not present

## 2021-09-03 DIAGNOSIS — D75839 Thrombocytosis, unspecified: Secondary | ICD-10-CM | POA: Diagnosis present

## 2021-09-03 DIAGNOSIS — I2694 Multiple subsegmental pulmonary emboli without acute cor pulmonale: Secondary | ICD-10-CM | POA: Diagnosis not present

## 2021-09-03 DIAGNOSIS — T380X5A Adverse effect of glucocorticoids and synthetic analogues, initial encounter: Secondary | ICD-10-CM | POA: Diagnosis not present

## 2021-09-03 DIAGNOSIS — I493 Ventricular premature depolarization: Secondary | ICD-10-CM | POA: Diagnosis not present

## 2021-09-03 DIAGNOSIS — R000 Tachycardia, unspecified: Secondary | ICD-10-CM | POA: Diagnosis not present

## 2021-09-03 DIAGNOSIS — K50919 Crohn's disease, unspecified, with unspecified complications: Secondary | ICD-10-CM | POA: Diagnosis not present

## 2021-09-03 DIAGNOSIS — Z8615 Personal history of latent tuberculosis infection: Secondary | ICD-10-CM

## 2021-09-03 DIAGNOSIS — K5190 Ulcerative colitis, unspecified, without complications: Secondary | ICD-10-CM | POA: Diagnosis not present

## 2021-09-03 DIAGNOSIS — I2609 Other pulmonary embolism with acute cor pulmonale: Secondary | ICD-10-CM | POA: Diagnosis not present

## 2021-09-03 DIAGNOSIS — K922 Gastrointestinal hemorrhage, unspecified: Secondary | ICD-10-CM | POA: Diagnosis not present

## 2021-09-03 DIAGNOSIS — I82442 Acute embolism and thrombosis of left tibial vein: Secondary | ICD-10-CM | POA: Diagnosis present

## 2021-09-03 DIAGNOSIS — K51011 Ulcerative (chronic) pancolitis with rectal bleeding: Principal | ICD-10-CM | POA: Diagnosis present

## 2021-09-03 DIAGNOSIS — R0602 Shortness of breath: Secondary | ICD-10-CM | POA: Diagnosis not present

## 2021-09-03 DIAGNOSIS — I2693 Single subsegmental pulmonary embolism without acute cor pulmonale: Secondary | ICD-10-CM | POA: Diagnosis not present

## 2021-09-03 DIAGNOSIS — Z79899 Other long term (current) drug therapy: Secondary | ICD-10-CM

## 2021-09-03 DIAGNOSIS — M7989 Other specified soft tissue disorders: Secondary | ICD-10-CM | POA: Diagnosis not present

## 2021-09-03 DIAGNOSIS — I2699 Other pulmonary embolism without acute cor pulmonale: Secondary | ICD-10-CM | POA: Diagnosis present

## 2021-09-03 DIAGNOSIS — Z8616 Personal history of COVID-19: Secondary | ICD-10-CM

## 2021-09-03 DIAGNOSIS — D509 Iron deficiency anemia, unspecified: Secondary | ICD-10-CM | POA: Diagnosis not present

## 2021-09-03 LAB — CBC WITH DIFFERENTIAL/PLATELET
Abs Immature Granulocytes: 0.16 10*3/uL — ABNORMAL HIGH (ref 0.00–0.07)
Basophils Absolute: 0 10*3/uL (ref 0.0–0.1)
Basophils Relative: 0 %
Eosinophils Absolute: 0 10*3/uL (ref 0.0–0.5)
Eosinophils Relative: 0 %
HCT: 20 % — ABNORMAL LOW (ref 39.0–52.0)
Hemoglobin: 5.5 g/dL — CL (ref 13.0–17.0)
Immature Granulocytes: 1 %
Lymphocytes Relative: 17 %
Lymphs Abs: 1.9 10*3/uL (ref 0.7–4.0)
MCH: 17.6 pg — ABNORMAL LOW (ref 26.0–34.0)
MCHC: 27.5 g/dL — ABNORMAL LOW (ref 30.0–36.0)
MCV: 64.1 fL — ABNORMAL LOW (ref 80.0–100.0)
Monocytes Absolute: 0.9 10*3/uL (ref 0.1–1.0)
Monocytes Relative: 8 %
Neutro Abs: 8.2 10*3/uL — ABNORMAL HIGH (ref 1.7–7.7)
Neutrophils Relative %: 74 %
Platelets: 498 10*3/uL — ABNORMAL HIGH (ref 150–400)
RBC: 3.12 MIL/uL — ABNORMAL LOW (ref 4.22–5.81)
RDW: 19.2 % — ABNORMAL HIGH (ref 11.5–15.5)
Smear Review: NORMAL
WBC: 11.2 10*3/uL — ABNORMAL HIGH (ref 4.0–10.5)
nRBC: 2.9 % — ABNORMAL HIGH (ref 0.0–0.2)

## 2021-09-03 LAB — COMPREHENSIVE METABOLIC PANEL
ALT: 17 U/L (ref 0–44)
AST: 15 U/L (ref 15–41)
Albumin: 3 g/dL — ABNORMAL LOW (ref 3.5–5.0)
Alkaline Phosphatase: 75 U/L (ref 38–126)
Anion gap: 6 (ref 5–15)
BUN: 16 mg/dL (ref 6–20)
CO2: 25 mmol/L (ref 22–32)
Calcium: 8.1 mg/dL — ABNORMAL LOW (ref 8.9–10.3)
Chloride: 105 mmol/L (ref 98–111)
Creatinine, Ser: 0.77 mg/dL (ref 0.61–1.24)
GFR, Estimated: >60 mL/min (ref >60)
Glucose, Bld: 129 mg/dL — ABNORMAL HIGH (ref 70–99)
Potassium: 4.5 mmol/L (ref 3.5–5.1)
Sodium: 136 mmol/L (ref 135–145)
Total Bilirubin: 0.4 mg/dL (ref 0.3–1.2)
Total Protein: 7.1 g/dL (ref 6.5–8.1)

## 2021-09-03 LAB — TROPONIN I (HIGH SENSITIVITY): Troponin I (High Sensitivity): 3 ng/L (ref <18)

## 2021-09-03 LAB — IRON AND TIBC
Iron: 7 ug/dL — ABNORMAL LOW (ref 45–182)
Saturation Ratios: 2 % — ABNORMAL LOW (ref 17.9–39.5)
TIBC: 385 ug/dL (ref 250–450)
UIBC: 378 ug/dL

## 2021-09-03 LAB — RESP PANEL BY RT-PCR (FLU A&B, COVID) ARPGX2
Influenza A by PCR: NEGATIVE
Influenza B by PCR: NEGATIVE
SARS Coronavirus 2 by RT PCR: NEGATIVE

## 2021-09-03 LAB — BRAIN NATRIURETIC PEPTIDE: B Natriuretic Peptide: 46.1 pg/mL (ref 0.0–100.0)

## 2021-09-03 LAB — RETICULOCYTES
Immature Retic Fract: 43.1 % — ABNORMAL HIGH (ref 2.3–15.9)
RBC.: 3.1 MIL/uL — ABNORMAL LOW (ref 4.22–5.81)
Retic Count, Absolute: 90.2 10*3/uL (ref 19.0–186.0)
Retic Ct Pct: 2.9 % (ref 0.4–3.1)

## 2021-09-03 LAB — OCCULT BLOOD X 1 CARD TO LAB, STOOL: Fecal Occult Bld: POSITIVE — AB

## 2021-09-03 LAB — VITAMIN B12: Vitamin B-12: 514 pg/mL (ref 180–914)

## 2021-09-03 LAB — FERRITIN: Ferritin: 5 ng/mL — ABNORMAL LOW (ref 24–336)

## 2021-09-03 LAB — FOLATE: Folate: 18.4 ng/mL (ref >5.9)

## 2021-09-03 MED ORDER — IOHEXOL 350 MG/ML SOLN
100.0000 mL | Freq: Once | INTRAVENOUS | Status: AC | PRN
  Administered 2021-09-03: 100 mL via INTRAVENOUS

## 2021-09-03 MED ORDER — SODIUM CHLORIDE 0.9 % IV SOLN: INTRAVENOUS | Status: AC

## 2021-09-03 MED ORDER — SODIUM CHLORIDE 0.9% IV SOLUTION: Freq: Once | INTRAVENOUS | Status: DC

## 2021-09-03 NOTE — ED Triage Notes (Signed)
Pt c/o left LE pain/swelling x ~10 days-had [place] PTA with +DVT-NAD-steady gait with cam walker left LE

## 2021-09-03 NOTE — ED Provider Notes (Signed)
[facility] Provider Note   CSN: [id number] Arrival date & time: 09/03/21  1547     History Chief Complaint  Patient presents with   DVT left LE    Reginald Parker is a 45 y.o. male.  45 year old male presents today for evaluation of DVT from ultrasound.  Patient has a history of Crohn's, latent TB.  On evaluation in the emergency room patient is found to have a hemoglobin of 5.5.  Patient reports he was diagnosed with COVID in March which exacerbated his Crohn's.  Around that time he was started on prednisone taper.  He says over the past few months he has been doing okay until he started the 5 mg prednisone dose.  Since then he had significant gross bloody diarrhea multiple times a day.  He discussed this with his gastroenterologist who increased his prednisone to 20 mg a day.  His first dose of the increased dose was Saturday with resolution of his GI symptoms.  Today he was evaluated by podiatrist for left foot pain and given his calf swelling he was referred for a DVT study which was positive.  He was referred to the emergency department from ultrasound.  He reports last time he got blood transfusion was 12 years ago.  He endorses history of dyspnea on exertion of 60-monthduration.  He denies associated chest pain, lightheadedness, or palpitations.  Currently denies abdominal pain.  The history is provided by the patient. No language interpreter was used.      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   PCP NOTES >>>>>>>>>>>>>>>>> 02/07/2016   Annual physical exam 02/06/2016   Osteoporosis 06/01/2015   Paronychia  06/01/2015   Primary sclerosing cholangitis    Crohn's disease ([facility]    NASAL POLYP 07/30/2010    Past Surgical History:  Procedure Laterality Date   NASAL SINUS SURGERY  2012       Family History  Problem Relation Age of Onset   Diabetes Father 638  Colon cancer Neg Hx    Prostate cancer Neg Hx    CAD Neg Hx     Social History   Tobacco Use   Smoking status: Never   Smokeless tobacco: Never  Vaping Use   Vaping Use: Never used  Substance Use Topics   Alcohol use: Not Currently   Drug use: No    Home Medications Prior to Admission medications   Medication Sig Start Date End Date Taking? Authorizing Provider  albuterol (VENTOLIN HFA) 108 (90 Base) MCG/ACT inhaler Inhale 2 puffs into the lungs every 6 (six) hours as needed for wheezing or shortness of breath. 07/26/19   PColon Branch MD  azelastine (ASTELIN) 0.1 % nasal spray Place 2 sprays into both nostrils 2 (two) times daily. 10/25/20   WShelda Pal DO  cetirizine (ZYRTEC) 10 MG tablet Take 10 mg by mouth daily.    [provider]  [employer] GENTLE LAXATIVE 5 MG EC tablet See admin instructions. 07/27/21   [provider]  diclofenac (VOLTAREN) 75 MG EC tablet Take 1 tablet (75 mg total) by mouth  2 (two) times daily for 7 days. Take AFTER eating. 08/27/21 09/03/21  Jeanie Sewer, NP  hydrocortisone (CORTENEMA) 100 MG/60ML enema SMARTSIG:60 Milliliter(s) Rectally Every Night 06/03/21   [provider]  LIALDA 1.2 G EC tablet Take 4.8 g by mouth daily. 05/04/15   [provider]  polyethylene glycol-electrolytes (NULYTELY) 420 g solution See admin instructions. 07/27/21   [provider]  predniSONE (DELTASONE) 10 MG tablet Take 20 mg by mouth daily. 05/30/21   [provider]  Probiotic Product (ALIGN PO) Take 1 tablet by mouth 2 (two) times daily.    [provider]  ursodiol (ACTIGALL) 500 MG tablet Take 500 mg by mouth 2 (two) times daily. 05/15/15    [provider]    Allergies    Ibuprofen  Review of Systems   Review of Systems  Constitutional:  Negative for activity change, chills and fever.  Eyes:  Negative for visual disturbance.  Respiratory:  Positive for shortness of breath.   Cardiovascular:  Positive for leg swelling. Negative for chest pain and palpitations.  Gastrointestinal:  Negative for abdominal pain, diarrhea, nausea and vomiting.  Neurological:  Negative for dizziness, syncope, weakness and light-headedness.  All other systems reviewed and are negative.  Physical Exam Updated Vital Signs BP 125/82 (BP Location: Right Arm)   Pulse 97   Temp 98.9 F (37.2 C) (Oral)   Resp 18   Ht 5' 6"  (1.676 m)   Wt 79.8 kg   SpO2 100%   BMI 28.41 kg/m   Physical Exam Vitals and nursing note reviewed.  Constitutional:      General: He is not in acute distress.    Appearance: Normal appearance. He is not ill-appearing.  HENT:     Head: Normocephalic and atraumatic.     Nose: Nose normal.  Eyes:     General: No scleral icterus.    Extraocular Movements: Extraocular movements intact.     Conjunctiva/sclera: Conjunctivae normal.  Cardiovascular:     Rate and Rhythm: Normal rate and regular rhythm.     Pulses: Normal pulses.     Heart sounds: Normal heart sounds.  Pulmonary:     Effort: Pulmonary effort is normal. No respiratory distress.     Breath sounds: Normal breath sounds. No wheezing or rales.  Abdominal:     General: There is no distension.     Tenderness: There is no abdominal tenderness.  Musculoskeletal:        General: Normal range of motion.     Cervical back: Normal range of motion.     Right lower leg: No edema.     Left lower leg: Edema present.  Skin:    General: Skin is warm and dry.  Neurological:     General: No focal deficit present.     Mental Status: He is alert. Mental status is at baseline.    ED Results / Procedures / Treatments   Labs (all labs ordered are listed, but  only abnormal results are displayed) Labs Reviewed  COMPREHENSIVE METABOLIC PANEL - Abnormal; Notable for the following components:      Result Value   Glucose, Bld 129 (*)    Calcium 8.1 (*)    Albumin 3.0 (*)    All other components within normal limits  CBC WITH DIFFERENTIAL/PLATELET - Abnormal; Notable for the following components:   WBC 11.2 (*)    RBC 3.12 (*)    Hemoglobin 5.5 (*)    HCT 20.0 (*)  MCV 64.1 (*)    MCH 17.6 (*)    MCHC 27.5 (*)    RDW 19.2 (*)    Platelets 498 (*)    nRBC 2.9 (*)    Neutro Abs 8.2 (*)    Abs Immature Granulocytes 0.16 (*)    All other components within normal limits  VITAMIN B12  FOLATE  IRON AND TIBC  FERRITIN  RETICULOCYTES  OCCULT BLOOD X 1 CARD TO LAB, STOOL    EKG None  Radiology US Venous Img Lower Unilateral Left (DVT)  Result Date: 09/03/2021 CLINICAL DATA:  Pain and swelling EXAM: LEFT LOWER EXTREMITY VENOUS DOPPLER ULTRASOUND TECHNIQUE: Gray-scale sonography with compression, as well as color and duplex ultrasound, were performed to evaluate the deep venous system(s) from the level of the common femoral vein through the popliteal and proximal calf veins. COMPARISON:  None. FINDINGS: VENOUS The gastrocnemius vein and posterior tibial veins are noncompressible containing hypoechoic thrombus. Peroneal vein normal. Normal compressibility of the common femoral, superficial femoral, and popliteal veins . Visualized portions of profunda femoral vein and great saphenous vein unremarkable. Limited views of the contralateral common femoral vein are unremarkable. OTHER None. Limitations: none IMPRESSION: 1. POSITIVE for gastrocnemius and posterior tibial (calf) DVT without propagation above the knee. Electronically Signed   By: Lucrezia Europe M.D.   On: 09/03/2021 15:21    Procedures Procedures   Medications Ordered in ED Medications - No data to display  ED Course  I have reviewed the triage vital signs and the nursing  notes.  Pertinent labs & imaging results that were available during my care of the patient were reviewed by me and considered in my medical decision making (see chart for details).    MDM Rules/Calculators/A&P                           45 year old male with a past medical history of Crohn's who initially presented for evaluation of DVT but found to have anemia of 5.5.  Patient's vital signs are stable.  Since restarting his increased dose of prednisone his GI symptoms have resolved including melena/BRBPR.  Digital rectal exam with brown stool and negative Hemoccult.  Given his history of dyspnea on exertion with finding of positive DVT in left lower extremity will do CTA chest to rule out PE.  Patient has stable acute on chronic anemia.  Unfortunately this facility is not capable of providing transfusions.  He will require admission and transfer to [facility].  Patient CT is positive for bilateral small subsegmental PEs.  Will order EKG, troponin, BNP to evaluate for right heart strain.  Will discuss case with hospitalist for admission.  Discussed with hospitalist who will admit patient.   Final Clinical Impression(s) / ED Diagnoses Final diagnoses:  None    Rx / DC Orders ED Discharge Orders     None        Evlyn Courier, PA-C 09/03/21 2041    Lucrezia Starch, MD 09/04/21 [phone]

## 2021-09-03 NOTE — Progress Notes (Addendum)
Subjective:   Patient ID: Reginald Parker, male   DOB: 45 y.o.   MRN: [id number]   HPI 45 year old male presents the office today for concerns of left foot, ankle as well as leg swelling.  He states that about a week and a half ago he woke up and had some swelling and some mild discomfort to his foot and throughout the day got worse and the next day he had increased pain.  He was seen by his primary care physician was placed on naproxen obvious Crohn's disease and he has stopped this as it was upsetting his stomach.  He talked to his GI specialist and they increased his prednisone to 20 mg and he states that since the increase of prednisone the foot pain is improved but the swelling still remains.  Still does get some discomfort however mostly in the bottom of the heel.  He is not recall any injury.   Review of Systems  All other systems reviewed and are negative.  Past Medical History:  Diagnosis Date   Crohn's disease ([place])    universal colitis   Elevated liver function tests    Osteoporosis 06/01/2015   Premature ventricular contractions    Primary sclerosing cholangitis    Seasonal allergies     Past Surgical History:  Procedure Laterality Date   NASAL SINUS SURGERY  2012     Current Outpatient Medications:    albuterol (VENTOLIN HFA) 108 (90 Base) MCG/ACT inhaler, Inhale 2 puffs into the lungs every 6 (six) hours as needed for wheezing or shortness of breath., Disp: 18 g, Rfl: 5   azelastine (ASTELIN) 0.1 % nasal spray, Place 2 sprays into both nostrils 2 (two) times daily., Disp: 30 mL, Rfl: 3   cetirizine (ZYRTEC) 10 MG tablet, Take 10 mg by mouth daily., Disp: , Rfl:    [employer] GENTLE LAXATIVE 5 MG EC tablet, See admin instructions., Disp: , Rfl:    diclofenac (VOLTAREN) 75 MG EC tablet, Take 1 tablet (75 mg total) by mouth 2 (two) times daily for 7 days. Take AFTER eating., Disp: 14 tablet, Rfl: 0   hydrocortisone (CORTENEMA) 100 MG/60ML enema, SMARTSIG:60 Milliliter(s) Rectally  Every Night, Disp: , Rfl:    LIALDA 1.2 G EC tablet, Take 4.8 g by mouth daily., Disp: , Rfl: 5   polyethylene glycol-electrolytes (NULYTELY) 420 g solution, See admin instructions., Disp: , Rfl:    predniSONE (DELTASONE) 10 MG tablet, Take 20 mg by mouth daily., Disp: , Rfl:    Probiotic Product (ALIGN PO), Take 1 tablet by mouth 2 (two) times daily., Disp: , Rfl:    ursodiol (ACTIGALL) 500 MG tablet, Take 500 mg by mouth 2 (two) times daily., Disp: , Rfl: 5  Current Facility-Administered Medications:    albuterol (VENTOLIN HFA) 108 (90 Base) MCG/ACT inhaler 2 puff, 2 puff, Inhalation, Once PRN, Paz, Alda Berthold, MD   diclofenac Sodium (VOLTAREN) 1 % topical gel 1 application, 1 application, Topical, QID, Hudnell, Stephanie, NP   diphenhydrAMINE (BENADRYL) injection 50 mg, 50 mg, Intramuscular, Once PRN, Paz, Jacqulyn Bath E, MD   EPINEPHrine (EPI-PEN) injection 0.3 mg, 0.3 mg, Intramuscular, Once PRN, Kathlene November E, MD   methylPREDNISolone sodium succinate (SOLU-MEDROL) 125 mg/2 mL injection 125 mg, 125 mg, Intramuscular, Once PRN, Colon Branch, MD  Allergies  Allergen Reactions   Ibuprofen Other (See Comments)    Causes Heartburn         Objective:  Physical Exam  General: AAO x3, NAD  Dermatological: Skin is  warm, dry and supple bilateral. There are no open sores, no preulcerative lesions, no rash or signs of infection present.  Vascular: Dorsalis Pedis artery and Posterior Tibial artery pedal pulses are 2/4 bilateral with immedate capillary fill time.  There is no pain with calf compression, warmth, erythema.   Neruologic: Grossly intact via light touch bilateral. Negative tinel sign.  Musculoskeletal: There are some mild tenderness palpation on the medial band plantar fashion on the insertion and also mildly on the arch of the foot.  There is no pain with lateral compression of the calcaneus.  There is edema present to the foot, ankle as well as to the leg compared to contra lower extremity  with there is no erythema or warmth.  Muscular strength 5/5 in all groups tested bilateral.  Gait: Unassisted, Nonantalgic.       Assessment:   Tendinitis, left leg swelling     Plan:  X-rays obtained reviewed.  No oblique view mild sclerotic line noted in the calcaneus however this could represent stress fracture however likely a normal finding. However given his heel pain and swelling I would like to treat for a potential stress fracture.  -He has already increased prednisone as he is on this for his Crohn's but also helping with the foot.  I recommend immobilization in a cam boot was dispensed today.  Encouraged elevation.  MRI order venous duplex rule out DVT although my suspicion is low.  He is scheduled for 230 later today.  If positive would likely need an emergency room.  However encouraged elevation.  I will see him back in about 1 week.  Trula Slade DPM

## 2021-09-04 ENCOUNTER — Inpatient Hospital Stay (HOSPITAL_COMMUNITY): Payer: BC Managed Care – PPO

## 2021-09-04 DIAGNOSIS — I82462 Acute embolism and thrombosis of left calf muscular vein: Secondary | ICD-10-CM

## 2021-09-04 DIAGNOSIS — I2609 Other pulmonary embolism with acute cor pulmonale: Secondary | ICD-10-CM

## 2021-09-04 DIAGNOSIS — K5100 Ulcerative (chronic) pancolitis without complications: Secondary | ICD-10-CM | POA: Diagnosis present

## 2021-09-04 DIAGNOSIS — K50919 Crohn's disease, unspecified, with unspecified complications: Secondary | ICD-10-CM

## 2021-09-04 DIAGNOSIS — D649 Anemia, unspecified: Secondary | ICD-10-CM

## 2021-09-04 DIAGNOSIS — I2694 Multiple subsegmental pulmonary emboli without acute cor pulmonale: Secondary | ICD-10-CM

## 2021-09-04 DIAGNOSIS — R609 Edema, unspecified: Secondary | ICD-10-CM | POA: Diagnosis not present

## 2021-09-04 LAB — BASIC METABOLIC PANEL
Anion gap: 9 (ref 5–15)
BUN: 15 mg/dL (ref 6–20)
CO2: 21 mmol/L — ABNORMAL LOW (ref 22–32)
Calcium: 7.6 mg/dL — ABNORMAL LOW (ref 8.9–10.3)
Chloride: 106 mmol/L (ref 98–111)
Creatinine, Ser: 0.73 mg/dL (ref 0.61–1.24)
GFR, Estimated: >60 mL/min (ref >60)
Glucose, Bld: 87 mg/dL (ref 70–99)
Potassium: 3.9 mmol/L (ref 3.5–5.1)
Sodium: 136 mmol/L (ref 135–145)

## 2021-09-04 LAB — ECHOCARDIOGRAM COMPLETE
Area-P 1/2: 4.8 cm2
Height: 66 in
S' Lateral: 3.4 cm
Weight: 2816 oz

## 2021-09-04 LAB — HEMATOCRIT
HCT: 25 % — ABNORMAL LOW (ref 39.0–52.0)
HCT: 25.9 % — ABNORMAL LOW (ref 39.0–52.0)
HCT: 24.6 % — ABNORMAL LOW (ref 39.0–52.0)

## 2021-09-04 LAB — PREPARE RBC (CROSSMATCH)

## 2021-09-04 LAB — TROPONIN I (HIGH SENSITIVITY): Troponin I (High Sensitivity): 5 ng/L (ref <18)

## 2021-09-04 LAB — HEMOGLOBIN
Hemoglobin: 7.5 g/dL — ABNORMAL LOW (ref 13.0–17.0)
Hemoglobin: 7.7 g/dL — ABNORMAL LOW (ref 13.0–17.0)
Hemoglobin: 7.4 g/dL — ABNORMAL LOW (ref 13.0–17.0)

## 2021-09-04 LAB — HIV ANTIBODY (ROUTINE TESTING W REFLEX): HIV Screen 4th Generation wRfx: NONREACTIVE

## 2021-09-04 LAB — HEPARIN LEVEL (UNFRACTIONATED): Heparin Unfractionated: 0.11 IU/mL — ABNORMAL LOW (ref 0.30–0.70)

## 2021-09-04 MED ORDER — ACETAMINOPHEN 325 MG PO TABS: 650.0000 mg | ORAL_TABLET | Freq: Four times a day (QID) | ORAL | Status: DC | PRN

## 2021-09-04 MED ORDER — HEPARIN BOLUS VIA INFUSION
1000.0000 [IU] | Freq: Once | INTRAVENOUS | Status: AC
  Administered 2021-09-04: 1000 [IU] via INTRAVENOUS
  Filled 2021-09-04: qty 1000

## 2021-09-04 MED ORDER — DIPHENHYDRAMINE HCL 25 MG PO CAPS: 25.0000 mg | ORAL_CAPSULE | Freq: Four times a day (QID) | ORAL | Status: DC | PRN

## 2021-09-04 MED ORDER — SODIUM CHLORIDE 0.9 % IV SOLN
500.0000 mg | Freq: Every day | INTRAVENOUS | Status: AC
  Administered 2021-09-04 – 2021-09-05 (×2): 500 mg via INTRAVENOUS
  Filled 2021-09-04 (×2): qty 10

## 2021-09-04 MED ORDER — ALBUTEROL SULFATE (2.5 MG/3ML) 0.083% IN NEBU: 2.5000 mg | INHALATION_SOLUTION | Freq: Four times a day (QID) | RESPIRATORY_TRACT | Status: DC | PRN

## 2021-09-04 MED ORDER — ONDANSETRON HCL 4 MG PO TABS: 4.0000 mg | ORAL_TABLET | Freq: Four times a day (QID) | ORAL | Status: DC | PRN

## 2021-09-04 MED ORDER — METHYLPREDNISOLONE SODIUM SUCC 125 MG IJ SOLR
90.0000 mg | Freq: Two times a day (BID) | INTRAMUSCULAR | Status: DC
  Administered 2021-09-04 – 2021-09-05 (×3): 90 mg via INTRAVENOUS
  Filled 2021-09-04 (×3): qty 2

## 2021-09-04 MED ORDER — DIPHENHYDRAMINE HCL 50 MG/ML IJ SOLN: 25.0000 mg | Freq: Four times a day (QID) | INTRAMUSCULAR | Status: DC | PRN

## 2021-09-04 MED ORDER — ACETAMINOPHEN 650 MG RE SUPP: 650.0000 mg | Freq: Four times a day (QID) | RECTAL | Status: DC | PRN

## 2021-09-04 MED ORDER — PREDNISONE 20 MG PO TABS
20.0000 mg | ORAL_TABLET | Freq: Every day | ORAL | Status: DC
  Administered 2021-09-04: 20 mg via ORAL
  Filled 2021-09-04: qty 1

## 2021-09-04 MED ORDER — HEPARIN BOLUS VIA INFUSION
2000.0000 [IU] | Freq: Once | INTRAVENOUS | Status: AC
  Administered 2021-09-04: 2000 [IU] via INTRAVENOUS
  Filled 2021-09-04: qty 2000

## 2021-09-04 MED ORDER — SODIUM CHLORIDE 0.9 % IV SOLN
25.0000 mg | Freq: Once | INTRAVENOUS | Status: AC
  Administered 2021-09-04: 25 mg via INTRAVENOUS
  Filled 2021-09-04: qty 0.5

## 2021-09-04 MED ORDER — HEPARIN (PORCINE) 25000 UT/250ML-% IV SOLN
1850.0000 [IU]/h | INTRAVENOUS | Status: AC
  Administered 2021-09-04 (×2): 1300 [IU]/h via INTRAVENOUS
  Administered 2021-09-05: 1450 [IU]/h via INTRAVENOUS
  Administered 2021-09-05: 1800 [IU]/h via INTRAVENOUS
  Administered 2021-09-06: 1850 [IU]/h via INTRAVENOUS
  Filled 2021-09-04 (×4): qty 250

## 2021-09-04 MED ORDER — ONDANSETRON HCL 4 MG/2ML IJ SOLN: 4.0000 mg | Freq: Four times a day (QID) | INTRAMUSCULAR | Status: DC | PRN

## 2021-09-04 NOTE — Consult Note (Signed)
Referring Provider: Dr. Mitzi Hansen Primary Care Physician:  Colon Branch, MD Primary Gastroenterologist:  Dr. Wilford Corner  Reason for Consultation:  symptomatic anemia and ulcerative pan colitis  HPI: Reginald Parker is a 45 y.o. male  with history of ulcerative pancolitis diagnosed in 2007 and primary sclerosing cholangitis diagnosed in 2008 has seen [facility] in the past presents to the hospital with symptomatic anemia and rectal bleeding.  Patient is known to Dr. Michail Sermon with [facility] gastroenterology, last seen in the office 03/27/2021 for ulcerative colitis flare 1 to 2 weeks after COVID infection in March.  At the time patient had been on prednisone 20 mg daily started on 03/15/2021. Patient continued on mesalamine 1.2 g 4 tablets daily and started on prednisone taper. At 03/27/2021 office visit hemoglobin 11.7, hematocrit 35.7 and white blood cell count 12.96, CRP negative  Patient had colonoscopy with Dr. Michail Sermon 08/09/2021 showed diffuse area of congestion, erythema throughout the entire colon terminal ileum appeared normal.  Biopsy showed chronic mildly active chronic colitis, negative for dysplasia.  Internal hemorrhoids.  Patient called 08/28/2021 having finished prednisone taper, started with watery diarrhea mixed with blood.  7-8 times per day.   No antibiotics but patient had recently been given diclofenac for foot pain as possible cause for the flare. Cdiff ordered but never completed.  Patient was restarted on 20 mg prednisone taper daily on Saturday.  Patient had 6 bowel movements loose stools Monday minimal bright red blood, Tuesday had 4 loose stools no blood seen at that time.  Has had urgency with bowel movements denies abdominal cramping. Last bowel movement was this morning loose with small to moderate bright red blood. Patient denies fever but did have chills Sunday. Patient states at this time 20 mg prednisone is not helping with his ulcerative colitis. Denies nausea,  vomiting, reflux, dysphagia, abdominal pain.  Patient was having left lower leg pain for 2 weeks with swelling.  He was evaluated for this complaint by podiatry today, ultrasound was obtained in light of the swelling, and he was found to have gastrocnemius and posterior tibial DVT.  Has CTA chest positive for small bilateral PE's.  Patient has been having shortness of breath but denies chest pain and tachycardia. Patient denies inactivity, recent surgery, family history of personal history of DVT.  Of note, patient did have a positive TB gold 05/2021 and was referred to ID, was successfully treated for latent TB in 2013, seen by Dr. Scharlene Gloss July of this year.  Patient does have osteopenia.  Has been on 5-ASA, has never been on biologic therapy, patient confirms this. Denies rashes, diffuse joint pain, mouth sores, changes in vision.   Past Medical History:  Diagnosis Date   Crohn's disease ([place])    universal colitis   Elevated liver function tests    Osteoporosis 06/01/2015   Premature ventricular contractions    Primary sclerosing cholangitis    Seasonal allergies     Past Surgical History:  Procedure Laterality Date   NASAL SINUS SURGERY  2012    Prior to Admission medications   Medication Sig Start Date End Date Taking? Authorizing Provider  albuterol (VENTOLIN HFA) 108 (90 Base) MCG/ACT inhaler Inhale 2 puffs into the lungs every 6 (six) hours as needed for wheezing or shortness of breath. 07/26/19  Yes Paz, Alda Berthold, MD  cetirizine (ZYRTEC) 10 MG tablet Take 10 mg by mouth daily.   Yes [provider]  LIALDA 1.2 G EC tablet Take 4.8 g by mouth  daily. 05/04/15  Yes [provider]  predniSONE (DELTASONE) 10 MG tablet Take 20 mg by mouth daily. 05/30/21  Yes [provider]  ursodiol (ACTIGALL) 500 MG tablet Take 500 mg by mouth 2 (two) times daily. 05/15/15  Yes [provider]  azelastine (ASTELIN) 0.1 % nasal spray Place 2 sprays into both  nostrils 2 (two) times daily. Patient not taking: No sig reported 10/25/20   Shelda Pal, DO  [employer] GENTLE LAXATIVE 5 MG EC tablet See admin instructions. Patient not taking: Reported on 09/03/2021 07/27/21   [provider]  hydrocortisone (CORTENEMA) 100 MG/60ML enema SMARTSIG:60 Milliliter(s) Rectally Every Night Patient not taking: Reported on 09/03/2021 06/03/21   [provider]  polyethylene glycol-electrolytes (NULYTELY) 420 g solution See admin instructions. Patient not taking: No sig reported 07/27/21   [provider]  Probiotic Product (ALIGN PO) Take 1 tablet by mouth 2 (two) times daily. Patient not taking: Reported on 09/03/2021    [provider]    Scheduled Meds:  sodium chloride   Intravenous Once   predniSONE  20 mg Oral Daily   Continuous Infusions:  sodium chloride     PRN Meds:.acetaminophen **OR** acetaminophen, albuterol, albuterol, diphenhydrAMINE **OR** diphenhydrAMINE, diphenhydrAMINE, EPINEPHrine, methylPREDNISolone (SOLU-MEDROL) injection, ondansetron **OR** ondansetron (ZOFRAN) IV  Allergies as of 09/03/2021 - Review Complete 09/03/2021  Allergen Reaction Noted   Ibuprofen Other (See Comments) 06/01/2015    Family History  Problem Relation Age of Onset   Diabetes Father 29   Colon cancer Neg Hx    Prostate cancer Neg Hx    CAD Neg Hx     Social History   Socioeconomic History   Marital status: Significant Other    Spouse name: Not on file   Number of children: 2   Years of education: Not on file   Highest education level: Not on file  Occupational History   Occupation: Dealer  Tobacco Use   Smoking status: Never   Smokeless tobacco: Never  Vaping Use   Vaping Use: Never used  Substance and Sexual Activity   Alcohol use: Not Currently   Drug use: No   Sexual activity: Not on file  Other Topics Concern   Not on file  Social History Narrative   Original from [place] w/  girlfriend in [place] 2018   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 Negative for fever and weight loss.  HENT:  Negative for sore throat.   Respiratory:  Positive for shortness of breath. Negative for cough, hemoptysis and sputum production.   Cardiovascular:  Positive for leg swelling. Negative for chest pain and palpitations.  Gastrointestinal:  Positive for blood in stool and diarrhea. Negative for abdominal pain, constipation, heartburn, melena, nausea and vomiting.  Musculoskeletal:  Negative for back pain, joint pain, myalgias and neck pain.  Skin:  Negative for rash.  Neurological:  Negative for dizziness and loss of consciousness.  Psychiatric/Behavioral:  Negative for memory loss.     Physical Exam: Vital signs: Vitals:   09/04/21 0218 09/04/21 0330  BP: 123/80 121/75  Pulse: 88 85  Resp: 19 19  Temp: 98.9 F (37.2 C) 99.1 F (37.3 C)  SpO2: 100%      Physical Exam Constitutional:      General: He is not in acute distress.    Appearance: Normal appearance. He is obese. He is not ill-appearing.  Eyes:     General: No scleral icterus.    Conjunctiva/sclera: Conjunctivae normal.  Cardiovascular:     Rate and Rhythm: Normal rate and regular rhythm.  Pulmonary:     Effort: Pulmonary effort is normal.     Breath sounds: Normal breath sounds.  Abdominal:     General: Bowel sounds are normal.     Palpations: Abdomen is soft.     Tenderness: There is no abdominal tenderness. There is no guarding or rebound.  Musculoskeletal:        General: Normal range of motion.      Right lower leg: Edema (non pitting mild edema) present.     Left lower leg: Edema (1-2 + edema) present.  Skin:    Coloration: Skin is not jaundiced or pale.  Neurological:     General: No focal deficit present.     Mental Status: He is alert and oriented to person, place, and time.  Psychiatric:        Mood and Affect: Mood normal.        Behavior: Behavior normal.     GI:  Lab Results: Recent Labs    09/03/21 1719  WBC 11.2*  HGB 5.5*  HCT 20.0*  PLT 498*   BMET Recent Labs    09/03/21 1719 09/04/21 0520  NA 136 136  K 4.5 3.9  CL 105 106  CO2 25 21*  GLUCOSE 129* 87  BUN 16 15  CREATININE 0.77 0.73  CALCIUM 8.1* 7.6*   LFT Recent Labs    09/03/21 1719  PROT 7.1  ALBUMIN 3.0*  AST 15  ALT 17  ALKPHOS 75  BILITOT 0.4   PT/INR No results for input(s): LABPROT, INR in the last 72 hours.  Studies/Results: CT Angio Chest PE W and/or Wo Contrast  Result Date: 09/03/2021 CLINICAL DATA:  Pulmonary embolus suspected with high probability. Left leg swelling. Positive ultrasound for DVT. Shortness of breath for 2 weeks. EXAM: CT ANGIOGRAPHY CHEST WITH CONTRAST TECHNIQUE: Multidetector CT imaging of the chest was performed using the standard protocol during bolus administration of intravenous contrast. Multiplanar CT image reconstructions and MIPs were obtained to evaluate the vascular anatomy. CONTRAST:  131m OMNIPAQUE IOHEXOL 350 MG/ML SOLN COMPARISON:  06/15/2010 FINDINGS: Cardiovascular: The contrast bolus is somewhat limited but there is moderately good visualization of the pulmonary arteries to the segmental level. No large central filling defects are demonstrated. There is a filling defect demonstrated in a right lower lung subsegmental pulmonary artery and probably in a couple of peripheral left lower lobe pulmonary arteries. This likely indicates peripheral emboli. No evidence of right heart strain. RV to LV ratio is normal at 0.8. Normal heart size. No  pericardial effusions. Normal caliber thoracic aorta. No aortic dissection. Great vessel origins are patent. Mediastinum/Nodes: Thyroid gland is unremarkable. Esophagus is decompressed. No significant lymphadenopathy. Lungs/Pleura: Mild dependent changes in the lung bases. Lungs are otherwise clear and expanded. No pleural effusions. No pneumothorax. Airways are patent. Upper Abdomen: Cholelithiasis with several stones in the gallbladder. No inflammatory changes or wall thickening. Scattered lymph nodes are not pathologically enlarged, likely reactive. Musculoskeletal: No chest wall abnormality. No acute  or significant osseous findings. Review of the MIP images confirms the above findings. IMPRESSION: 1. Contrast bolus to the pulmonary arteries is somewhat limited but there appear to be small subsegmental pulmonary emboli in both lung bases. No large central pulmonary embolus. No evidence of right heart strain. 2. Mild dependent atelectasis in the lung bases. Lungs are otherwise clear. 3. Cholelithiasis. Critical Value/emergent results were called by telephone at the time of interpretation on 09/03/2021 at 7:29 pm to provider Dr. Lester Kinsman, who verbally acknowledged these results. Electronically Signed   By: Lucienne Capers M.D.   On: 09/03/2021 19:35   US Venous Img Lower Unilateral Left (DVT)  Result Date: 09/03/2021 CLINICAL DATA:  Pain and swelling EXAM: LEFT LOWER EXTREMITY VENOUS DOPPLER ULTRASOUND TECHNIQUE: Gray-scale sonography with compression, as well as color and duplex ultrasound, were performed to evaluate the deep venous system(s) from the level of the common femoral vein through the popliteal and proximal calf veins. COMPARISON:  None. FINDINGS: VENOUS The gastrocnemius vein and posterior tibial veins are noncompressible containing hypoechoic thrombus. Peroneal vein normal. Normal compressibility of the common femoral, superficial femoral, and popliteal veins . Visualized portions of profunda  femoral vein and great saphenous vein unremarkable. Limited views of the contralateral common femoral vein are unremarkable. OTHER None. Limitations: none IMPRESSION: 1. POSITIVE for gastrocnemius and posterior tibial (calf) DVT without propagation above the knee. Electronically Signed   By: Lucrezia Europe M.D.   On: 09/03/2021 15:21    Impression and Plan Symptomatic anemia s/p 1 PRBC HGB 5.5 MCV 64.1 Platelets 498 Iron 7, Ferritin 5 BUN 5.5 Cr 0.73 GFR >60  Continue to monitor H&H with transfusion as needed to maintain hemoglobin greater than 7. Can do iron infusion  Ulcerative pancolitis 08/09/2021 Colonoscopy showed chronic mildly active chronic colitis, negative for dysplasia.  Internal hemorrhoids. Patient has only been on 5-ASA therapy, has history of treated latent TB has seen infectious disease in July, states biologic therapy is okay at this time. Patient had negative hepatitis screening July 2022. Will start on IV methylprednisone and transition to PO out patient will consider starting infliximaub therapy outpatient.  Soft diet at this time, if worsening pain switch back to liquid diet.  Will get Cdiff testing due to ABX in march, has not had tested outpatient and for possible biologic use.   Acute DVT/PE Patient had COVID March of this year, has had flare of ulcerative colitis since that time, as this puts him at a higher risk for coagulation this is a possible cause. Will suggest starting on IV methylprednisone, and can start Lovenox or He[place]in inpatient per primary preference.  Can consider coagulopathy work up outpatient but most likely related to colitis.   PBS AST 15 ALT 17 Alkphos 75 TBili 0.4 Last MR CP 04/2013 Last ultrasound 05/2007 LFTs normal Continue ursidiol  Latent TB treated   LOS: 1 day   Vladimir Crofts  PA-C 09/04/2021, 8:03 AM  Contact #  [phone]

## 2021-09-04 NOTE — Progress Notes (Signed)
  Echocardiogram 2D Echocardiogram has been performed.  Fidel Levy 09/04/2021, 3:33 PM

## 2021-09-04 NOTE — ED Notes (Signed)
Call to blood bank and they will call me once blood is available

## 2021-09-04 NOTE — Progress Notes (Signed)
ANTICOAGULATION CONSULT NOTE   Pharmacy Consult for Heparin infusion Indication: DVT  Allergies  Allergen Reactions   Ibuprofen Other (See Comments)    Causes Heartburn    Patient Measurements: Height: 5' 6"  (167.6 cm) Weight: 79.8 kg (176 lb) IBW/kg (Calculated) : 63.8 Heparin Dosing Weight: 798 kg  Vital Signs: Temp: 99.1 F (37.3 C) (10/12 0330) Temp Source: Oral (10/12 0330) BP: 121/75 (10/12 0330) Pulse Rate: 85 (10/12 0330)  Labs: Recent Labs    09/03/21 1719 09/03/21 2355 09/04/21 0520  HGB 5.5*  --   --   HCT 20.0*  --   --   PLT 498*  --   --   CREATININE 0.77  --  0.73  TROPONINIHS 3 5  --    Estimated Creatinine Clearance: 115.8 mL/min (by C-G formula based on SCr of 0.73 mg/dL).  Medical History: Past Medical History:  Diagnosis Date   Crohn's disease ([place])    universal colitis   Elevated liver function tests    Osteoporosis 06/01/2015   Premature ventricular contractions    Primary sclerosing cholangitis    Seasonal allergies    Medications:  Scheduled:   sodium chloride   Intravenous Once   heparin  2,000 Units Intravenous Once   methylPREDNISolone (SOLU-MEDROL) injection  90 mg Intravenous BID   Infusions:   sodium chloride     heparin      Assessment: 64 yoM with colitis, GI bleed, new RLE DVT. Begin Heparin infusion, OK per GI Admit Hgb 5.5, transfused 2 units   Goal of Therapy:  Goal Heparin level - aiming for lower end of therapeutic range 0.3-0.5 unit/ml Monitor platelets by anticoagulation protocol: Yes   Plan:  Heparin bolus 2000 units, infusion at 1300 units/hr Check 6 hr Heparin level Serial H/H per MD, CBC daily from 10/14 Daily Heparin level at steady state   Minda Ditto PharmD 09/04/2021,10:22 AM

## 2021-09-04 NOTE — Progress Notes (Signed)
PROGRESS NOTE    Reginald Parker  AQT:[id number] DOB: 12/13/1975 DOA: 09/03/2021 PCP: Colon Branch, MD   Chief Complaint  Patient presents with   DVT left LE    Brief Narrative:  Patient 45 year old gentleman with medical history significant for Crohn's disease, successfully treated latent TB presented to the ED for left foot swelling diagnosed with a DVT  with noted swelling in bilateral foot and ankle after treatment the UV shielding July.  Patient seen by podiatry after recurrent left-sided swelling and pain x10 days, ultrasound obtained concerning for DVT in the gastrocnemius and posterior tibial DVT.  Patient also with complaints of bloody diarrhea after prednisone taper decreased to 5 mg with improvement with bloody diarrhea after prednisone increased back to 20 mg with some complaints of exertional shortness of breath.  CT angiogram chest done negative for large central PE or right heart strain but suspicious for small subsegmental PE at bilateral bases.  Patient also noted on presentation to have a hemoglobin of 5.5 and transfused 2 units packed red blood cells early this morning.  GI consulted and following.   Assessment & Plan:   Principal Problem:   Symptomatic anemia Active Problems:   Crohn's disease ([facility])   GI bleeding   Left leg DVT ([facility])   Pulmonary emboli ([facility])   Ulcerative pancolitis ([facility])   #1 symptomatic anemia, lower GI bleed/ulcerative pancolitis -Patient presenting with symptomatic lower GI bleed, history of ulcerative pancolitis and noted to have a left calf DVT on outpatient ultrasound.  Patient states had a bloody bowel movement this morning. -Hemoglobin on presentation 5.5. -Patient noted to have some diarrhea with bleeding then resolved about 4 days prior to admission however FOBT was positive in the ED and patient did have a bloody bowel movement this morning. -Status post transfusion of 2 units packed red blood cells. -Posttransfusion H&H  pending. -Patient seen in consultation by GI and patient started on IV methylprednisolone and diet advanced to a soft diet. -Per GI.  2.  Acute left lower extremity DVT/PE -Patient increased risk for DVT PE with recent COVID in March 2022, flare of ulcerative colitis since that time. -Patient started on IV methylprednisone for ulcerative pancolitis and cleared by GI for anticoagulation at this time. -Placed on heparin per pharmacy and monitor bleeding and H&H. -Once bleeding has stabilized with no further bleeding, H&H stable could likely transition to oral NOAC. -We will need outpatient follow-up.     DVT prophylaxis: Heparin Code Status: Full Family Communication: Updated patient and wife at bedside. Disposition:   Status is: Inpatient  Remains inpatient appropriate because:Inpatient level of care appropriate due to severity of illness  Dispo: The patient is from: Home              Anticipated d/c is to: Home              Patient currently is not medically stable to d/c.   Difficult to place patient No       Consultants:  Gastroenterology: Dr.Magod 09/04/2021  Procedures:  Lower extremity Dopplers 09/04/2021 2D echo pending 09/04/2021 Transfusion 2 units packed red blood cells 09/04/2021    Antimicrobials:  None   Subjective: Laying in bed.  States had a small bloody bowel movement this morning.  No chest pain.  No abdominal pain.  Overall feeling a little bit better than on admission.  Stated received 2 units of packed red blood cells transfusion.  Had shortness of breath on exertion prior to admission however  has not been up and ambulating.  Feels left leg swelling has improved.  Objective: Vitals:   09/04/21 0330 09/04/21 1100 09/04/21 1412 09/04/21 1813  BP: 121/75 121/71 122/77 125/84  Pulse: 85 79 79 90  Resp: 19 15 15 18   Temp: 99.1 F (37.3 C)  99.4 F (37.4 C) 98.2 F (36.8 C)  TempSrc: Oral  Oral Oral  SpO2:  98% 99% 100%  Weight:      Height:         Intake/Output Summary (Last 24 hours) at 09/04/2021 2122 Last data filed at 09/04/2021 0600 Gross per 24 hour  Intake 615 ml  Output --  Net 615 ml   Filed Weights   09/03/21 1607  Weight: 79.8 kg    Examination:  General exam: Appears calm and comfortable  Respiratory system: Clear to auscultation. Respiratory effort normal. Cardiovascular system: S1 & S2 heard, RRR. No JVD, murmurs, rubs, gallops or clicks. No pedal edema. Gastrointestinal system: Abdomen is nondistended, soft and nontender. No organomegaly or masses felt. Normal bowel sounds heard. Central nervous system: Alert and oriented. No focal neurological deficits. Extremities: Left lower extremity with decreased swelling palpation, nontender to palpation.  Skin: No rashes, lesions or ulcers Psychiatry: Judgement and insight appear normal. Mood & affect appropriate.     Data Reviewed: I have personally reviewed following labs and imaging studies  CBC: Recent Labs  Lab 09/03/21 1719 09/04/21 1300 09/04/21 1453  WBC 11.2*  --   --   NEUTROABS 8.2*  --   --   HGB 5.5* 7.5* 7.7*  HCT 20.0* 25.0* 25.9*  MCV 64.1*  --   --   PLT 498*  --   --     Basic Metabolic Panel: Recent Labs  Lab 09/03/21 1719 09/04/21 0520  NA 136 136  K 4.5 3.9  CL 105 106  CO2 25 21*  GLUCOSE 129* 87  BUN 16 15  CREATININE 0.77 0.73  CALCIUM 8.1* 7.6*    GFR: Estimated Creatinine Clearance: 115.8 mL/min (by C-G formula based on SCr of 0.73 mg/dL).  Liver Function Tests: Recent Labs  Lab 09/03/21 1719  AST 15  ALT 17  ALKPHOS 75  BILITOT 0.4  PROT 7.1  ALBUMIN 3.0*    CBG: No results for input(s): GLUCAP in the last 168 hours.   Recent Results (from the past 240 hour(s))  Resp Panel by RT-PCR (Flu A&B, Covid) Nasopharyngeal Swab     Status: None   Collection Time: 09/03/21  8:24 PM   Specimen: Nasopharyngeal Swab; Nasopharyngeal(NP) swabs in vial transport medium  Result Value Ref Range Status    SARS Coronavirus 2 by RT PCR NEGATIVE NEGATIVE Final    Comment: (NOTE) SARS-CoV-2 target nucleic acids are NOT DETECTED.  The SARS-CoV-2 RNA is generally detectable in upper respiratory specimens during the acute phase of infection. The lowest concentration of SARS-CoV-2 viral copies this assay can detect is 138 copies/mL. A negative result does not preclude SARS-Cov-2 infection and should not be used as the sole basis for treatment or other patient management decisions. A negative result may occur with  improper specimen collection/handling, submission of specimen other than nasopharyngeal swab, presence of viral mutation(s) within the areas targeted by this assay, and inadequate number of viral copies(<138 copies/mL). A negative result must be combined with clinical observations, patient history, and epidemiological information. The expected result is Negative.  Fact Sheet for Patients:  EntrepreneurPulse.com.au  Fact Sheet for Healthcare Providers:  IncredibleEmployment.be  This test  is no t yet approved or cleared by the [place] and  has been authorized for detection and/or diagnosis of SARS-CoV-2 by FDA under an Emergency Use Authorization (EUA). This EUA will remain  in effect (meaning this test can be used) for the duration of the COVID-19 declaration under Section 564(b)(1) of the Act, 21 U.S.C.section 360bbb-3(b)(1), unless the authorization is terminated  or revoked sooner.       Influenza A by PCR NEGATIVE NEGATIVE Final   Influenza B by PCR NEGATIVE NEGATIVE Final    Comment: (NOTE) The Xpert Xpress SARS-CoV-2/FLU/RSV plus assay is intended as an aid in the diagnosis of influenza from Nasopharyngeal swab specimens and should not be used as a sole basis for treatment. Nasal washings and aspirates are unacceptable for Xpert Xpress SARS-CoV-2/FLU/RSV testing.  Fact Sheet for  Patients: EntrepreneurPulse.com.au  Fact Sheet for Healthcare Providers: IncredibleEmployment.be  This test is not yet approved or cleared by the [place] FDA and has been authorized for detection and/or diagnosis of SARS-CoV-2 by FDA under an Emergency Use Authorization (EUA). This EUA will remain in effect (meaning this test can be used) for the duration of the COVID-19 declaration under Section 564(b)(1) of the Act, 21 U.S.C. section 360bbb-3(b)(1), unless the authorization is terminated or revoked.  Performed at [facility], [address] [place], [place] [address]          Radiology Studies: CT Angio Chest PE W and/or Wo Contrast  Result Date: 09/03/2021 CLINICAL DATA:  Pulmonary embolus suspected with high probability. Left leg swelling. Positive ultrasound for DVT. Shortness of breath for 2 weeks. EXAM: CT ANGIOGRAPHY CHEST WITH CONTRAST TECHNIQUE: Multidetector CT imaging of the chest was performed using the standard protocol during bolus administration of intravenous contrast. Multiplanar CT image reconstructions and MIPs were obtained to evaluate the vascular anatomy. CONTRAST:  134m OMNIPAQUE IOHEXOL 350 MG/ML SOLN COMPARISON:  06/15/2010 FINDINGS: Cardiovascular: The contrast bolus is somewhat limited but there is moderately good visualization of the pulmonary arteries to the segmental level. No large central filling defects are demonstrated. There is a filling defect demonstrated in a right lower lung subsegmental pulmonary artery and probably in a couple of peripheral left lower lobe pulmonary arteries. This likely indicates peripheral emboli. No evidence of right heart strain. RV to LV ratio is normal at 0.8. Normal heart size. No pericardial effusions. Normal caliber thoracic aorta. No aortic dissection. Great vessel origins are patent. Mediastinum/Nodes: Thyroid gland is unremarkable. Esophagus is decompressed. No  significant lymphadenopathy. Lungs/Pleura: Mild dependent changes in the lung bases. Lungs are otherwise clear and expanded. No pleural effusions. No pneumothorax. Airways are patent. Upper Abdomen: Cholelithiasis with several stones in the gallbladder. No inflammatory changes or wall thickening. Scattered lymph nodes are not pathologically enlarged, likely reactive. Musculoskeletal: No chest wall abnormality. No acute or significant osseous findings. Review of the MIP images confirms the above findings. IMPRESSION: 1. Contrast bolus to the pulmonary arteries is somewhat limited but there appear to be small subsegmental pulmonary emboli in both lung bases. No large central pulmonary embolus. No evidence of right heart strain. 2. Mild dependent atelectasis in the lung bases. Lungs are otherwise clear. 3. Cholelithiasis. Critical Value/emergent results were called by telephone at the time of interpretation on 09/03/2021 at 7:29 pm to provider Dr. DLester Kinsman who verbally acknowledged these results. Electronically Signed   By: WLucienne CapersM.D.   On: 09/03/2021 19:35   [place] Img Lower Unilateral Left (DVT)  Result Date:  09/03/2021 CLINICAL DATA:  Pain and swelling EXAM: LEFT LOWER EXTREMITY VENOUS DOPPLER ULTRASOUND TECHNIQUE: Gray-scale sonography with compression, as well as color and duplex ultrasound, were performed to evaluate the deep venous system(s) from the level of the common femoral vein through the popliteal and proximal calf veins. COMPARISON:  None. FINDINGS: VENOUS The gastrocnemius vein and posterior tibial veins are noncompressible containing hypoechoic thrombus. Peroneal vein normal. Normal compressibility of the common femoral, superficial femoral, and popliteal veins . Visualized portions of profunda femoral vein and great saphenous vein unremarkable. Limited views of the contralateral common femoral vein are unremarkable. OTHER None. Limitations: none IMPRESSION: 1. POSITIVE for  gastrocnemius and posterior tibial (calf) DVT without propagation above the knee. Electronically Signed   By: Lucrezia Europe M.D.   On: 09/03/2021 15:21   ECHOCARDIOGRAM COMPLETE  Result Date: 09/04/2021    ECHOCARDIOGRAM REPORT   Patient Name:   Reginald Parker Date of Exam: 09/04/2021 Medical Rec #:  [id number]         Height:       66.0 in Accession #:    [id number]        Weight:       176.0 lb Date of Birth:  1976-08-09         BSA:          1.894 m Patient Age:    31 years          BP:           121/73 mmHg Patient Gender: M                 HR:           86 bpm. Exam Location:  Inpatient Procedure: 2D Echo, Cardiac Doppler and Color Doppler Indications:    Pulmonary Embolus I26.09  History:        Patient has prior history of Echocardiogram examinations, most                 recent 02/07/2019.  Sonographer:    Bernadene Person RDCS Referring Phys: [place]  1. Prominent LV apical band. Left ventricular ejection fraction, by estimation, is 60 to 65%. The left ventricle has normal function. The left ventricle has no regional wall motion abnormalities. Left ventricular diastolic parameters were normal.  2. Right ventricular systolic function is normal. The right ventricular size is normal.  3. The mitral valve is normal in structure. Trivial mitral valve regurgitation. No evidence of mitral stenosis.  4. The aortic valve is normal in structure. Aortic valve regurgitation is not visualized. No aortic stenosis is present.  5. The inferior vena cava is normal in size with greater than 50% respiratory variability, suggesting right atrial pressure of 3 mmHg. FINDINGS  Left Ventricle: Prominent LV apical band. Left ventricular ejection fraction, by estimation, is 60 to 65%. The left ventricle has normal function. The left ventricle has no regional wall motion abnormalities. The left ventricular internal cavity size was normal in size. There is no left ventricular hypertrophy. Left ventricular  diastolic parameters were normal. Right Ventricle: The right ventricular size is normal. No increase in right ventricular wall thickness. Right ventricular systolic function is normal. Left Atrium: Left atrial size was normal in size. Right Atrium: Right atrial size was normal in size. Pericardium: There is no evidence of pericardial effusion. Mitral Valve: The mitral valve is normal in structure. Trivial mitral valve regurgitation. No evidence of mitral valve stenosis. Tricuspid Valve: The tricuspid valve is  normal in structure. Tricuspid valve regurgitation is trivial. No evidence of tricuspid stenosis. Aortic Valve: The aortic valve is normal in structure. Aortic valve regurgitation is not visualized. No aortic stenosis is present. Pulmonic Valve: The pulmonic valve was normal in structure. Pulmonic valve regurgitation is not visualized. No evidence of pulmonic stenosis. Aorta: The aortic root is normal in size and structure. Venous: The inferior vena cava is normal in size with greater than 50% respiratory variability, suggesting right atrial pressure of 3 mmHg. IAS/Shunts: No atrial level shunt detected by color flow Doppler.  LEFT VENTRICLE PLAX 2D LVIDd:         5.00 cm   Diastology LVIDs:         3.40 cm   LV e' medial:    9.68 cm/s LV PW:         1.00 cm   LV E/e' medial:  8.8 LV IVS:        0.90 cm   LV e' lateral:   11.50 cm/s LVOT diam:     2.10 cm   LV E/e' lateral: 7.4 LV SV:         66 LV SV Index:   35 LVOT Area:     3.46 cm  RIGHT VENTRICLE RV S prime:     14.80 cm/s TAPSE (M-mode): 2.9 cm LEFT ATRIUM             Index        RIGHT ATRIUM           Index LA diam:        3.00 cm 1.58 cm/m   RA Area:     18.40 cm LA Vol (A2C):   50.5 ml 26.66 ml/m  RA Volume:   50.10 ml  26.45 ml/m LA Vol (A4C):   47.2 ml 24.92 ml/m LA Biplane Vol: 52.2 ml 27.56 ml/m  AORTIC VALVE LVOT Vmax:   108.00 cm/s LVOT Vmean:  72.300 cm/s LVOT VTI:    0.190 m  AORTA Ao Asc diam: 3.50 cm MITRAL VALVE MV Area (PHT): 4.80  cm    SHUNTS MV Decel Time: 158 msec    Systemic VTI:  0.19 m MV E velocity: 84.80 cm/s  Systemic Diam: 2.10 cm MV A velocity: 67.30 cm/s MV E/A ratio:  1.26 Jenkins Rouge MD Electronically signed by Jenkins Rouge MD Signature Date/Time: 09/04/2021/3:38:52 PM    Final    VAS [place] LOWER EXTREMITY VENOUS (DVT)  Result Date: 09/04/2021  Lower Venous DVT Study Patient Name:  Reginald Parker  Date of Exam:   09/04/2021 Medical Rec #: [id number]          Accession #:    [id number] Date of Birth: Mar 06, 1976          Patient Gender: M Patient Age:   37 years Exam Location:  [facility] Procedure:      VAS [place] LOWER EXTREMITY VENOUS (DVT) Referring Phys: TIMOTHY OPYD --------------------------------------------------------------------------------  Indications: Edema.  Risk Factors: DVT. Comparison Study: 09/03/2021 - POSITIVE for gastrocnemius and posterior tibial                   (calf) DVT                   without propagation above the knee. Performing Technologist: Oliver Hum RVT  Examination Guidelines: A complete evaluation includes B-mode imaging, spectral Doppler, color Doppler, and power Doppler as needed of all accessible portions of each vessel. Bilateral testing is considered  an integral part of a complete examination. Limited examinations for reoccurring indications may be performed as noted. The reflux portion of the exam is performed with the patient in reverse Trendelenburg.  +---------+---------------+---------+-----------+----------+--------------+ RIGHT    CompressibilityPhasicitySpontaneityPropertiesThrombus Aging +---------+---------------+---------+-----------+----------+--------------+ CFV      Full           Yes      Yes                                 +---------+---------------+---------+-----------+----------+--------------+ SFJ      Full                                                         +---------+---------------+---------+-----------+----------+--------------+ FV Prox  Full                                                        +---------+---------------+---------+-----------+----------+--------------+ FV Mid   Full                                                        +---------+---------------+---------+-----------+----------+--------------+ FV DistalFull                                                        +---------+---------------+---------+-----------+----------+--------------+ PFV      Full                                                        +---------+---------------+---------+-----------+----------+--------------+ POP      Full           Yes      Yes                                 +---------+---------------+---------+-----------+----------+--------------+ PTV      Full                                                        +---------+---------------+---------+-----------+----------+--------------+ PERO     Full                                                        +---------+---------------+---------+-----------+----------+--------------+   +----+---------------+---------+-----------+----------+--------------+ LEFTCompressibilityPhasicitySpontaneityPropertiesThrombus Aging +----+---------------+---------+-----------+----------+--------------+ CFV Full  Yes      Yes                                 +----+---------------+---------+-----------+----------+--------------+    Summary: RIGHT: - There is no evidence of deep vein thrombosis in the lower extremity.  - No cystic structure found in the popliteal fossa.  LEFT: - No evidence of common femoral vein obstruction.  *See table(s) above for measurements and observations. Electronically signed by Harold Barban MD on 09/04/2021 at 9:22:10 PM.    Final         Scheduled Meds:  sodium chloride   Intravenous Once   methylPREDNISolone (SOLU-MEDROL) injection  90  mg Intravenous BID   Continuous Infusions:  heparin 1,450 Units/hr (09/04/21 2114)   iron dextran (INFED/DEXFERRRUM) 500 MG IVPB 500 mg (09/04/21 2102)     LOS: 1 day    Time spent: 40 minutes    Irine Seal, MD [facility]   To contact the attending provider between 7A-7P or the covering provider during after hours 7P-7A, please log into the web site [email or web address] and access using universal [place] password for that web site. If you do not have the password, please call the hospital operator.  09/04/2021, 9:22 PM

## 2021-09-04 NOTE — H&P (Signed)
History and Physical    Reginald Parker HYI:[id number] DOB: 06/13/1976 DOA: 09/03/2021  PCP: Colon Branch, MD   Patient coming from: Home   Chief Complaint: Left foot swelling, DVT on outpatient [place]   HPI: Reginald Parker is a pleasant 45 y.o. male with medical history significant for Crohn's disease and successfully treated latent TB, now presenting to the emergency department for evaluation of left foot swelling with DVT diagnosed on outpatient ultrasound.  Patient reports that he developed bilateral foot and ankle swelling shortly after treatment with Evusheld in July, had resolution of the left sided swelling after a couple weeks, has had persistent mild right foot and ankle swelling, but then went on to develop recurrent swelling on the left side with severe sharp left foot pain for the past 10 days.  He was evaluated for this complaint by podiatry today, ultrasound was obtained in light of the swelling, and he was found to have gastrocnemius and posterior tibial DVT.  Patient also reports that he had been on a long prednisone taper and developed bloody diarrhea when he got down to 5 mg, continued to have bloody diarrhea for 1 to 2 weeks, but after resuming 20 mg of prednisone daily, diarrhea and bleeding appeared to have resolved as of 08/31/2021.  He has had some mild exertional dyspnea recently but denies any cough, hemoptysis, or chest pain.  [facility] ED Course: Upon arrival to the ED, patient is found to be afebrile, saturating well on room air, slightly tachycardic, and with stable blood pressure.  EKG features sinus rhythm.  CTA chest is negative for large central PE or right heart strain but suspicious for small subsegmental PE at the bilateral bases.  Blood work notable for hemoglobin 5.5 with microcytosis, mild leukocytosis, and mild thrombocytosis.  Troponin and BNP were normal.  Patient was transferred to [facility] for admission.  Review of Systems:  All other systems  reviewed and apart from HPI, are negative.  Past Medical History:  Diagnosis Date   Crohn's disease ([place])    universal colitis   Elevated liver function tests    Osteoporosis 06/01/2015   Premature ventricular contractions    Primary sclerosing cholangitis    Seasonal allergies     Past Surgical History:  Procedure Laterality Date   NASAL SINUS SURGERY  2012    Social History:   reports that he has never smoked. He has never used smokeless tobacco. He reports that he does not currently use alcohol. He reports that he does not use drugs.  Allergies  Allergen Reactions   Ibuprofen Other (See Comments)    Causes Heartburn    Family History  Problem Relation Age of Onset   Diabetes Father 11   Colon cancer Neg Hx    Prostate cancer Neg Hx    CAD Neg Hx      Prior to Admission medications   Medication Sig Start Date End Date Taking? Authorizing Provider  albuterol (VENTOLIN HFA) 108 (90 Base) MCG/ACT inhaler Inhale 2 puffs into the lungs every 6 (six) hours as needed for wheezing or shortness of breath. 07/26/19  Yes Paz, Alda Berthold, MD  cetirizine (ZYRTEC) 10 MG tablet Take 10 mg by mouth daily.   Yes [provider]  LIALDA 1.2 G EC tablet Take 4.8 g by mouth daily. 05/04/15  Yes [provider]  predniSONE (DELTASONE) 10 MG tablet Take 20 mg by mouth daily. 05/30/21  Yes [provider]  ursodiol (ACTIGALL) 500 MG tablet  Take 500 mg by mouth 2 (two) times daily. 05/15/15  Yes [provider]  azelastine (ASTELIN) 0.1 % nasal spray Place 2 sprays into both nostrils 2 (two) times daily. Patient not taking: No sig reported 10/25/20   Shelda Pal, DO  [facility] GENTLE LAXATIVE 5 MG EC tablet See admin instructions. Patient not taking: Reported on 09/03/2021 07/27/21   [provider]  hydrocortisone (CORTENEMA) 100 MG/60ML enema SMARTSIG:60 Milliliter(s) Rectally Every Night Patient not taking: Reported on 09/03/2021 06/03/21   [provider]  polyethylene glycol-electrolytes (NULYTELY) 420 g solution See admin instructions. Patient not taking: No sig reported 07/27/21   [provider]  Probiotic Product (ALIGN PO) Take 1 tablet by mouth 2 (two) times daily. Patient not taking: Reported on 09/03/2021    [provider]    Physical Exam: Vitals:   09/03/21 1815 09/03/21 1900 09/03/21 2022 09/03/21 2300  BP: 125/82 119/77 122/68 128/78  Pulse: 97 96 85 (!) 105  Resp: 18 18 17 15   Temp:    99.9 F (37.7 C)  TempSrc:    Oral  SpO2: 100% 100% 100% 100%  Weight:      Height:        Constitutional: NAD, calm  Eyes: PERTLA, lids and conjunctivae normal ENMT: Mucous membranes are moist. Posterior pharynx clear of any exudate or lesions.   Neck: supple, no masses  Respiratory: clear to auscultation bilaterally, no wheezing, no crackles. No accessory muscle use.  Cardiovascular: S1 & S2 heard, regular rate and rhythm. No significant JVD. Abdomen: No distension, no tenderness, soft. Bowel sounds active.  Musculoskeletal: no clubbing / cyanosis. No joint deformity upper and lower extremities.   Skin: no significant rashes, lesions, ulcers. Warm, dry, well-perfused. Neurologic: CN 2-12 grossly intact. Moving all extremities. Alert and oriented.  Psychiatric: Pleasant. Cooperative.    Labs and Imaging on Admission: I have personally reviewed following labs and imaging studies  CBC: Recent Labs  Lab 09/03/21 1719  WBC 11.2*  NEUTROABS 8.2*  HGB 5.5*  HCT 20.0*  MCV 64.1*  PLT 696*   Basic Metabolic Panel: Recent Labs  Lab 09/03/21 1719  NA 136  K 4.5  CL 105  CO2 25  GLUCOSE 129*  BUN 16  CREATININE 0.77  CALCIUM 8.1*   GFR: Estimated Creatinine Clearance: 115.8 mL/min (by C-G formula based on SCr of 0.77 mg/dL). Liver Function Tests: Recent Labs  Lab 09/03/21 1719  AST 15  ALT 17  ALKPHOS 75  BILITOT 0.4  PROT 7.1  ALBUMIN 3.0*   No results for input(s): LIPASE,  AMYLASE in the last 168 hours. No results for input(s): AMMONIA in the last 168 hours. Coagulation Profile: No results for input(s): INR, PROTIME in the last 168 hours. Cardiac Enzymes: No results for input(s): CKTOTAL, CKMB, CKMBINDEX, TROPONINI in the last 168 hours. BNP (last 3 results) No results for input(s): PROBNP in the last 8760 hours. HbA1C: No results for input(s): HGBA1C in the last 72 hours. CBG: No results for input(s): GLUCAP in the last 168 hours. Lipid Profile: No results for input(s): CHOL, HDL, LDLCALC, TRIG, CHOLHDL, LDLDIRECT in the last 72 hours. Thyroid Function Tests: No results for input(s): TSH, T4TOTAL, FREET4, T3FREE, THYROIDAB in the last 72 hours. Anemia Panel: Recent Labs    09/03/21 1844  VITAMINB12 514  FOLATE 18.4  FERRITIN 5*  TIBC 385  IRON 7*  RETICCTPCT 2.9   Urine analysis:    Component Value Date/Time   COLORURINE YELLOW  08/26/2007 1959   APPEARANCEUR CLEAR 08/26/2007 1959   LABSPEC 1.015 08/26/2007 [place] 7.0 08/26/2007 1959   GLUCOSEU NEGATIVE 08/26/2007 1959   HGBUR NEGATIVE 08/26/2007 1959   BILIRUBINUR NEGATIVE 08/26/2007 1959   KETONESUR NEGATIVE 08/26/2007 1959   PROTEINUR NEGATIVE 08/26/2007 1959   UROBILINOGEN 1.0 08/26/2007 1959   NITRITE NEGATIVE 08/26/2007 1959   LEUKOCYTESUR  08/26/2007 1959    NEGATIVE MICROSCOPIC NOT DONE ON URINES WITH NEGATIVE PROTEIN, BLOOD, LEUKOCYTES, NITRITE, OR GLUCOSE <1000 mg/dL.   Sepsis Labs: @LABRCNTIP (procalcitonin:4,lacticidven:4) ) Recent Results (from the past 240 hour(s))  Resp Panel by RT-PCR (Flu A&B, Covid) Nasopharyngeal Swab     Status: None   Collection Time: 09/03/21  8:24 PM   Specimen: Nasopharyngeal Swab; Nasopharyngeal(NP) swabs in vial transport medium  Result Value Ref Range Status   SARS Coronavirus 2 by RT PCR NEGATIVE NEGATIVE Final    Comment: (NOTE) SARS-CoV-2 target nucleic acids are NOT DETECTED.  The SARS-CoV-2 RNA is generally detectable in  upper respiratory specimens during the acute phase of infection. The lowest concentration of SARS-CoV-2 viral copies this assay can detect is 138 copies/mL. A negative result does not preclude SARS-Cov-2 infection and should not be used as the sole basis for treatment or other patient management decisions. A negative result may occur with  improper specimen collection/handling, submission of specimen other than nasopharyngeal swab, presence of viral mutation(s) within the areas targeted by this assay, and inadequate number of viral copies(<138 copies/mL). A negative result must be combined with clinical observations, patient history, and epidemiological information. The expected result is Negative.  Fact Sheet for Patients:  EntrepreneurPulse.com.au  Fact Sheet for Healthcare Providers:  IncredibleEmployment.be  This test is no t yet approved or cleared by the [place] FDA and  has been authorized for detection and/or diagnosis of SARS-CoV-2 by FDA under an Emergency Use Authorization (EUA). This EUA will remain  in effect (meaning this test can be used) for the duration of the COVID-19 declaration under Section 564(b)(1) of the Act, 21 U.S.C.section 360bbb-3(b)(1), unless the authorization is terminated  or revoked sooner.       Influenza A by PCR NEGATIVE NEGATIVE Final   Influenza B by PCR NEGATIVE NEGATIVE Final    Comment: (NOTE) The Xpert Xpress SARS-CoV-2/FLU/RSV plus assay is intended as an aid in the diagnosis of influenza from Nasopharyngeal swab specimens and should not be used as a sole basis for treatment. Nasal washings and aspirates are unacceptable for Xpert Xpress SARS-CoV-2/FLU/RSV testing.  Fact Sheet for Patients: EntrepreneurPulse.com.au  Fact Sheet for Healthcare Providers: IncredibleEmployment.be  This test is not yet approved or cleared by the [place] FDA and has been  authorized for detection and/or diagnosis of SARS-CoV-2 by FDA under an Emergency Use Authorization (EUA). This EUA will remain in effect (meaning this test can be used) for the duration of the COVID-19 declaration under Section 564(b)(1) of the Act, 21 U.S.C. section 360bbb-3(b)(1), unless the authorization is terminated or revoked.  Performed at [facility], [address] [place], [place] [address]      Radiological Exams on Admission: CT Angio Chest PE W and/or Wo Contrast  Result Date: 09/03/2021 CLINICAL DATA:  Pulmonary embolus suspected with high probability. Left leg swelling. Positive ultrasound for DVT. Shortness of breath for 2 weeks. EXAM: CT ANGIOGRAPHY CHEST WITH CONTRAST TECHNIQUE: Multidetector CT imaging of the chest was performed using the standard protocol during bolus administration of intravenous contrast. Multiplanar CT image reconstructions and  MIPs were obtained to evaluate the vascular anatomy. CONTRAST:  128m OMNIPAQUE IOHEXOL 350 MG/ML SOLN COMPARISON:  06/15/2010 FINDINGS: Cardiovascular: The contrast bolus is somewhat limited but there is moderately good visualization of the pulmonary arteries to the segmental level. No large central filling defects are demonstrated. There is a filling defect demonstrated in a right lower lung subsegmental pulmonary artery and probably in a couple of peripheral left lower lobe pulmonary arteries. This likely indicates peripheral emboli. No evidence of right heart strain. RV to LV ratio is normal at 0.8. Normal heart size. No pericardial effusions. Normal caliber thoracic aorta. No aortic dissection. Great vessel origins are patent. Mediastinum/Nodes: Thyroid gland is unremarkable. Esophagus is decompressed. No significant lymphadenopathy. Lungs/Pleura: Mild dependent changes in the lung bases. Lungs are otherwise clear and expanded. No pleural effusions. No pneumothorax. Airways are patent. Upper Abdomen: Cholelithiasis  with several stones in the gallbladder. No inflammatory changes or wall thickening. Scattered lymph nodes are not pathologically enlarged, likely reactive. Musculoskeletal: No chest wall abnormality. No acute or significant osseous findings. Review of the MIP images confirms the above findings. IMPRESSION: 1. Contrast bolus to the pulmonary arteries is somewhat limited but there appear to be small subsegmental pulmonary emboli in both lung bases. No large central pulmonary embolus. No evidence of right heart strain. 2. Mild dependent atelectasis in the lung bases. Lungs are otherwise clear. 3. Cholelithiasis. Critical Value/emergent results were called by telephone at the time of interpretation on 09/03/2021 at 7:29 pm to provider Dr. DLester Kinsman who verbally acknowledged these results. Electronically Signed   By: WLucienne CapersM.D.   On: 09/03/2021 19:35   [place] Img Lower Unilateral Left (DVT)  Result Date: 09/03/2021 CLINICAL DATA:  Pain and swelling EXAM: LEFT LOWER EXTREMITY VENOUS DOPPLER ULTRASOUND TECHNIQUE: Gray-scale sonography with compression, as well as color and duplex ultrasound, were performed to evaluate the deep venous system(s) from the level of the common femoral vein through the popliteal and proximal calf veins. COMPARISON:  None. FINDINGS: VENOUS The gastrocnemius vein and posterior tibial veins are noncompressible containing hypoechoic thrombus. Peroneal vein normal. Normal compressibility of the common femoral, superficial femoral, and popliteal veins . Visualized portions of profunda femoral vein and great saphenous vein unremarkable. Limited views of the contralateral common femoral vein are unremarkable. OTHER None. Limitations: none IMPRESSION: 1. POSITIVE for gastrocnemius and posterior tibial (calf) DVT without propagation above the knee. Electronically Signed   By: DLucrezia EuropeM.D.   On: 09/03/2021 15:21    EKG: Independently reviewed. Sinus rhythm.   Assessment/Plan   1.  Symptomatic anemia; lower GI bleeding  - Pt with IBD presents for evaluation of left calf DVT on outpatient [place] reports recent bloody diarrhea, and found to have Hgb 5.5  - Pt reports diarrhea and bleeding appeared to have resolved 4 days ago but FOBT+ in ED   - Accepting physician sent message to GI with request for routine consult   - Transfuse 2 units, follow serial H&H, follow-up GI recommendations   2. PE, lower extremity DVT - Left calf DVT and small subsegmental b/l PEs diagnosed today  - He is hemodynamically stable with normal BNP and troponin, no CT evidence for heart strain, and no respiratory complaints  - Management complicated by recent GI bleeding with symptomatic anemia  - Transfuse as above, follow serial H&H, follow-up GI recs regarding bleeding, and anticoagulate if H&H stable and GI okay with that, otherwise consult IR or vascular for IVC filter   3. Crohn disease  -  Continue prednisone     DVT prophylaxis: Held for tonight with GI bleeding and calf DVT Code Status: Full  Level of Care: Level of care: Stepdown Family Communication: Significant other updated at bedside  Disposition Plan:  Patient is from: Home  Anticipated d/c is to: Home  Anticipated d/c date is: 09/07/21 Patient currently: pending blood transfusion, serial H&H, GI consultation, if unable to anticoagulate may need vascular consult for IVC filter  Consults called: Accepting physician sent message to GI with request for routine consult  Admission status: Inpatient     Vianne Bulls, MD [facility]  09/04/2021, 12:28 AM

## 2021-09-04 NOTE — Progress Notes (Signed)
Right lower extremity venous duplex has been completed. Preliminary results can be found in CV Proc through chart review.   09/04/21 10:02 AM Reginald Parker RVT

## 2021-09-04 NOTE — Progress Notes (Signed)
ANTICOAGULATION CONSULT NOTE   Pharmacy Consult for Heparin infusion Indication: DVT  Allergies  Allergen Reactions   Ibuprofen Other (See Comments)    Causes Heartburn    Patient Measurements: Height: 5' 6"  (167.6 cm) Weight: 79.8 kg (176 lb) IBW/kg (Calculated) : 63.8 Heparin Dosing Weight: 798 kg  Vital Signs: Temp: 98.2 F (36.8 C) (10/12 1813) Temp Source: Oral (10/12 1813) BP: 125/84 (10/12 1813) Pulse Rate: 90 (10/12 1813)  Labs: Recent Labs    09/03/21 1719 09/03/21 2355 09/04/21 0520 09/04/21 1300 09/04/21 1453 09/04/21 1855  HGB 5.5*  --   --  7.5* 7.7*  --   HCT 20.0*  --   --  25.0* 25.9*  --   PLT 498*  --   --   --   --   --   HEPARINUNFRC  --   --   --   --   --  0.11*  CREATININE 0.77  --  0.73  --   --   --   TROPONINIHS 3 5  --   --   --   --     Estimated Creatinine Clearance: 115.8 mL/min (by C-G formula based on SCr of 0.73 mg/dL).  Medications:  Scheduled:   sodium chloride   Intravenous Once   heparin  1,000 Units Intravenous Once   methylPREDNISolone (SOLU-MEDROL) injection  90 mg Intravenous BID   Infusions:   heparin 1,300 Units/hr (09/04/21 1256)   iron dextran (INFED/DEXFERRRUM) 500 MG IVPB 500 mg (09/04/21 2102)    Assessment: 32 yoM with colitis, GI bleed, new RLE DVT. Begin Heparin infusion, OK per GI Admit Hgb 5.5, transfused 2 units  Today, 09/04/2021: First heparin level SUBtherapeutic on 1300 units/hr Hgb low but stable/improved after PRBC x 2; Plt WNL SCr stable WNL, at baseline Per RN, pt does still note scanty blood in stool earlier this evening  Goal of Therapy:  Goal Heparin level - aiming for lower end of therapeutic range 0.3-0.5 unit/ml Monitor platelets by anticoagulation protocol: Yes   Plan:  Will increase heparin conservatively given active LGIB Heparin bolus 1000 units IV x 1 Increase heparin infusion to 1450 units/hr Check 6 hr Heparin level Serial H/H per MD, CBC daily from 10/14 Daily Heparin  level at steady state   Nikola Blackston A PharmD 09/04/2021,9:08 PM

## 2021-09-05 LAB — TYPE AND SCREEN
ABO/RH(D): B POS
Antibody Screen: NEGATIVE
Unit division: 0
Unit division: 0

## 2021-09-05 LAB — HEMOGLOBIN
Hemoglobin: 7.7 g/dL — ABNORMAL LOW (ref 13.0–17.0)
Hemoglobin: 7.7 g/dL — ABNORMAL LOW (ref 13.0–17.0)

## 2021-09-05 LAB — HEPARIN LEVEL (UNFRACTIONATED)
Heparin Unfractionated: 0.21 IU/mL — ABNORMAL LOW (ref 0.30–0.70)
Heparin Unfractionated: 0.25 IU/mL — ABNORMAL LOW (ref 0.30–0.70)
Heparin Unfractionated: 0.27 IU/mL — ABNORMAL LOW (ref 0.30–0.70)

## 2021-09-05 LAB — BASIC METABOLIC PANEL
Anion gap: 6 (ref 5–15)
BUN: 14 mg/dL (ref 6–20)
CO2: 25 mmol/L (ref 22–32)
Calcium: 8.6 mg/dL — ABNORMAL LOW (ref 8.9–10.3)
Chloride: 109 mmol/L (ref 98–111)
Creatinine, Ser: 0.72 mg/dL (ref 0.61–1.24)
GFR, Estimated: >60 mL/min (ref >60)
Glucose, Bld: 144 mg/dL — ABNORMAL HIGH (ref 70–99)
Potassium: 4.2 mmol/L (ref 3.5–5.1)
Sodium: 140 mmol/L (ref 135–145)

## 2021-09-05 LAB — BPAM RBC
Blood Product Expiration Date: 202210292359
Blood Product Expiration Date: 202211032359
ISSUE DATE / TIME: 202210120150
ISSUE DATE / TIME: 202210120323
Unit Type and Rh: 7300
Unit Type and Rh: 7300

## 2021-09-05 LAB — C DIFFICILE QUICK SCREEN W PCR REFLEX
C Diff antigen: NEGATIVE
C Diff interpretation: NOT DETECTED
C Diff toxin: NEGATIVE

## 2021-09-05 LAB — HEMATOCRIT
HCT: 26.4 % — ABNORMAL LOW (ref 39.0–52.0)
HCT: 25.8 % — ABNORMAL LOW (ref 39.0–52.0)

## 2021-09-05 MED ORDER — URSODIOL 300 MG PO CAPS
300.0000 mg | ORAL_CAPSULE | Freq: Two times a day (BID) | ORAL | Status: DC
  Administered 2021-09-05 – 2021-09-08 (×7): 300 mg via ORAL
  Filled 2021-09-05 (×8): qty 1

## 2021-09-05 MED ORDER — LORATADINE 10 MG PO TABS
10.0000 mg | ORAL_TABLET | Freq: Every day | ORAL | Status: DC
  Administered 2021-09-05 – 2021-09-08 (×4): 10 mg via ORAL
  Filled 2021-09-05 (×4): qty 1

## 2021-09-05 MED ORDER — PANTOPRAZOLE SODIUM 40 MG PO TBEC
40.0000 mg | DELAYED_RELEASE_TABLET | Freq: Every day | ORAL | Status: DC
  Administered 2021-09-06 – 2021-09-08 (×3): 40 mg via ORAL
  Filled 2021-09-05 (×3): qty 1

## 2021-09-05 NOTE — Progress Notes (Signed)
[facility] Gastroenterology Progress Note  Reginald Parker 45 y.o. Aug 18, 1976  CC:  Symptomatic anemia, ulcerative pancolitis, PBS, acute DVT/PE  Subjective: Patient soft diet, doing well.  Denies nausea vomiting abdominal pain. To bowel movements last night both diarrhea, 1 with blood. 1 bowel movement this morning at 6 AM soft without blood Denies fever, chills, shortness of breath, chest pain. He feels the swelling is improved.  ROS : Review of Systems  Constitutional:  Positive for malaise/fatigue. Negative for chills and fever.  Respiratory:  Negative for shortness of breath.   Cardiovascular:  Positive for leg swelling (improving). Negative for chest pain and palpitations.  Gastrointestinal:  Positive for blood in stool and diarrhea. Negative for abdominal pain, constipation, heartburn, melena, nausea and vomiting.  Musculoskeletal:  Negative for falls.  Neurological:  Negative for dizziness.    Objective: Vital signs in last 24 hours: Vitals:   09/05/21 0318 09/05/21 0537  BP: 116/88 130/83  Pulse: 82 85  Resp: 16 18  Temp: 98.8 F (37.1 C) 98.9 F (37.2 C)  SpO2: 97% 97%    Physical Exam: General:   Alert, in NAD Heart:  Regular rate and rhythm; no murmurs Pulm: Clear anteriorly; no wheezing Abdomen:  Soft, Obese AB, skin exam normal, Normal bowel sounds.  no  tenderness  noted on exam . Without guarding and Without rebound, without hepatomegaly. Extremities:  Without edema. Neurologic:  Alert and  oriented x4;  grossly normal neurologically. Psych:  Alert and cooperative. Normal mood and affect.   Lab Results: Recent Labs    09/04/21 0520 09/05/21 0313  NA 136 140  K 3.9 4.2  CL 106 109  CO2 21* 25  GLUCOSE 87 144*  BUN 15 14  CREATININE 0.73 0.72  CALCIUM 7.6* 8.6*   Recent Labs    09/03/21 1719  AST 15  ALT 17  ALKPHOS 75  BILITOT 0.4  PROT 7.1  ALBUMIN 3.0*   Recent Labs    09/03/21 1719 09/04/21 1300 09/04/21 2310 09/05/21 0313  WBC  11.2*  --   --   --   NEUTROABS 8.2*  --   --   --   HGB 5.5*   < > 7.4* 7.7*  HCT 20.0*   < > 24.6* 26.4*  MCV 64.1*  --   --   --   PLT 498*  --   --   --    < > = values in this interval not displayed.   No results for input(s): LABPROT, INR in the last 72 hours.  Lab Results: Results for orders placed or performed during the hospital encounter of 09/03/21 (from the past 48 hour(s))  Comprehensive metabolic panel     Status: Abnormal   Collection Time: 09/03/21  5:19 PM  Result Value Ref Range   Sodium 136 135 - 145 mmol/L   Potassium 4.5 3.5 - 5.1 mmol/L   Chloride 105 98 - 111 mmol/L   CO2 25 22 - 32 mmol/L   Glucose, Bld 129 (H) 70 - 99 mg/dL    Comment: Glucose reference range applies only to samples taken after fasting for at least 8 hours.   BUN 16 6 - 20 mg/dL   Creatinine, Ser 0.77 0.61 - 1.24 mg/dL   Calcium 8.1 (L) 8.9 - 10.3 mg/dL   Total Protein 7.1 6.5 - 8.1 g/dL   Albumin 3.0 (L) 3.5 - 5.0 g/dL   AST 15 15 - 41 U/L   ALT 17 0 - 44 U/L  Alkaline Phosphatase 75 38 - 126 U/L   Total Bilirubin 0.4 0.3 - 1.2 mg/dL   GFR, Estimated >60 >60 mL/min    Comment: (NOTE) Calculated using the CKD-EPI Creatinine Equation (2021)    Anion gap 6 5 - 15    Comment: Performed at [facility], [address] [place], [place] [address]  CBC with Differential     Status: Abnormal   Collection Time: 09/03/21  5:19 PM  Result Value Ref Range   WBC 11.2 (H) 4.0 - 10.5 K/uL    Comment: WHITE COUNT CONFIRMED ON SMEAR   RBC 3.12 (L) 4.22 - 5.81 MIL/uL   Hemoglobin 5.5 (LL) 13.0 - 17.0 g/dL    Comment: REPEATED TO VERIFY Reticulocyte Hemoglobin testing may be clinically indicated, consider ordering this additional test [id number] THIS CRITICAL RESULT HAS VERIFIED AND BEEN CALLED TO MARVA SIMMS RN BY ISAAC SUGUT ON 10 11 2022 AT 1800, AND HAS BEEN READ BACK.  THIS CRITICAL RESULT HAS VERIFIED AND BEEN CALLED TO MARVA SIMMS RN BY ISAAC [place] ON 10 11 2022 AT 1801, AND  HAS BEEN READ BACK.     HCT 20.0 (L) 39.0 - 52.0 %   MCV 64.1 (L) 80.0 - 100.0 fL   MCH 17.6 (L) 26.0 - 34.0 pg   MCHC 27.5 (L) 30.0 - 36.0 g/dL   RDW 19.2 (H) 11.5 - 15.5 %   Platelets 498 (H) 150 - 400 K/uL   nRBC 2.9 (H) 0.0 - 0.2 %   Neutrophils Relative % 74 %   Neutro Abs 8.2 (H) 1.7 - 7.7 K/uL   Lymphocytes Relative 17 %   Lymphs Abs 1.9 0.7 - 4.0 K/uL   Monocytes Relative 8 %   Monocytes Absolute 0.9 0.1 - 1.0 K/uL   Eosinophils Relative 0 %   Eosinophils Absolute 0.0 0.0 - 0.5 K/uL   Basophils Relative 0 %   Basophils Absolute 0.0 0.0 - 0.1 K/uL   WBC Morphology MORPHOLOGY UNREMARKABLE    Smear Review Normal platelet morphology    Immature Granulocytes 1 %   Abs Immature Granulocytes 0.16 (H) 0.00 - 0.07 K/uL   Polychromasia PRESENT    Target Cells PRESENT     Comment: Performed at [facility], [address] [place], [place] [address]  Troponin I (High Sensitivity)     Status: None   Collection Time: 09/03/21  5:19 PM  Result Value Ref Range   Troponin I (High Sensitivity) 3 <18 ng/L    Comment: (NOTE) Elevated high sensitivity troponin I (hsTnI) values and significant  changes across serial measurements may suggest ACS but many other  chronic and acute conditions are known to elevate hsTnI results.  Refer to the "Links" section for chest pain algorithms and additional  guidance. Performed at [facility], [address] [place], [place] [address]   Brain natriuretic peptide     Status: None   Collection Time: 09/03/21  5:19 PM  Result Value Ref Range   B Natriuretic Peptide 46.1 0.0 - 100.0 pg/mL    Comment: Performed at [facility], [address] [place], [place] [address]  Occult blood card to lab, stool     Status: Abnormal   Collection Time: 09/03/21  6:25 PM  Result Value Ref Range   Fecal Occult Bld POSITIVE (A) NEGATIVE    Comment: Performed at [facility], [address], [place], [place] [address]  Vitamin B12     Status: None   Collection Time: 09/03/21  6:44 PM  Result Value Ref Range   Vitamin B-12 514 180 - 914 pg/mL    Comment: (NOTE) This assay is not validated for testing neonatal or myeloproliferative syndrome specimens for Vitamin B12 levels. Performed at [facility], [place] [address] [place], [place] [address]   Folate     Status: None   Collection Time: 09/03/21  6:44 PM  Result Value Ref Range   Folate 18.4 >5.9 ng/mL    Comment: Performed at [place] [address], [place], [place] [address]  Iron and TIBC     Status: Abnormal   Collection Time: 09/03/21  6:44 PM  Result Value Ref Range   Iron 7 (L) 45 - 182 ug/dL   TIBC 385 250 - 450 ug/dL   Saturation Ratios 2 (L) 17.9 - 39.5 %   UIBC 378 ug/dL    Comment: Performed at [facility], [place] [address], [place], [place] [address]  Ferritin     Status: Abnormal   Collection Time: 09/03/21  6:44 PM  Result Value Ref Range   Ferritin 5 (L) 24 - 336 ng/mL    Comment: Performed at [facility], [place] [address], [place], [place] [address]  Resp Panel by RT-PCR (Flu A&B, Covid) Nasopharyngeal Swab     Status: None   Collection Time: 09/03/21  8:24 PM   Specimen: Nasopharyngeal Swab; Nasopharyngeal(NP) swabs in vial transport medium  Result Value Ref Range   SARS Coronavirus 2 by RT PCR NEGATIVE NEGATIVE    Comment: (NOTE) SARS-CoV-2 target nucleic acids are NOT DETECTED.  The SARS-CoV-2 RNA is generally detectable in upper respiratory specimens during the acute phase of infection. The lowest concentration of SARS-CoV-2 viral copies this assay can detect is 138 copies/mL. A negative result does not preclude SARS-Cov-2 infection and should not be used as the sole basis for treatment or other patient management decisions. A negative result may occur with  improper specimen collection/handling, submission of specimen other than nasopharyngeal swab, presence of viral mutation(s) within  the areas targeted by this assay, and inadequate number of viral copies(<138 copies/mL). A negative result must be combined with clinical observations, patient history, and epidemiological information. The expected result is Negative.  Fact Sheet for Patients:  EntrepreneurPulse.com.au  Fact Sheet for Healthcare Providers:  IncredibleEmployment.be  This test is no t yet approved or cleared by the [place] FDA and  has been authorized for detection and/or diagnosis of SARS-CoV-2 by FDA under an Emergency Use Authorization (EUA). This EUA will remain  in effect (meaning this test can be used) for the duration of the COVID-19 declaration under Section 564(b)(1) of the Act, 21 U.S.C.section 360bbb-3(b)(1), unless the authorization is terminated  or revoked sooner.       Influenza A by PCR NEGATIVE NEGATIVE   Influenza B by PCR NEGATIVE NEGATIVE    Comment: (NOTE) The Xpert Xpress SARS-CoV-2/FLU/RSV plus assay is intended as an aid in the diagnosis of influenza from Nasopharyngeal swab specimens and should not be used as a sole basis for treatment. Nasal washings and aspirates are unacceptable for Xpert Xpress SARS-CoV-2/FLU/RSV testing.  Fact Sheet for Patients: EntrepreneurPulse.com.au  Fact Sheet for Healthcare Providers: IncredibleEmployment.be  This test is not yet approved or cleared by the [place] FDA and has been authorized for detection and/or diagnosis of SARS-CoV-2 by FDA under an Emergency Use Authorization (EUA). This  EUA will remain in effect (meaning this test can be used) for the duration of the COVID-19 declaration under Section 564(b)(1) of the Act, 21 U.S.C. section 360bbb-3(b)(1), unless the authorization is terminated or revoked.  Performed at [facility], [address] [place], [place] [address]   Troponin I (High Sensitivity)     Status: None   Collection  Time: 09/03/21 11:55 PM  Result Value Ref Range   Troponin I (High Sensitivity) 5 <18 ng/L    Comment: (NOTE) Elevated high sensitivity troponin I (hsTnI) values and significant  changes across serial measurements may suggest ACS but many other  chronic and acute conditions are known to elevate hsTnI results.  Refer to the "Links" section for chest pain algorithms and additional  guidance. Performed at [facility], [place] [address] [place], [place] [address]   Type and screen [place]     Status: None (Preliminary result)   Collection Time: 09/03/21 11:56 PM  Result Value Ref Range   ABO/RH(D) B POS    Antibody Screen NEG    Sample Expiration 09/06/2021,2359    Unit Number [id number]    Blood Component Type RED CELLS,LR    Unit division 00    Status of Unit ISSUED    Transfusion Status OK TO TRANSFUSE    Crossmatch Result Compatible    Unit Number [id number]    Blood Component Type RED CELLS,LR    Unit division 00    Status of Unit ISSUED    Transfusion Status OK TO TRANSFUSE    Crossmatch Result      Compatible Performed at [facility], [place] [address], [place], [place] [address]   Prepare RBC (crossmatch)     Status: None   Collection Time: 09/03/21 11:56 PM  Result Value Ref Range   Order Confirmation      ORDER PROCESSED BY BLOOD BANK Performed at [place] [address], [place], [place] [address]   HIV Antibody (routine testing w rflx)     Status: None   Collection Time: 09/04/21  1:51 AM  Result Value Ref Range   HIV Screen 4th Generation wRfx Non Reactive Non Reactive    Comment: Performed at [facility], [place] [address], [place], [place] [address]  Basic metabolic panel     Status: Abnormal   Collection Time: 09/04/21  5:20 AM  Result Value Ref Range   Sodium 136 135 - 145 mmol/L   Potassium 3.9 3.5 - 5.1 mmol/L   Chloride 106 98 - 111 mmol/L   CO2 21 (L) 22 - 32  mmol/L   Glucose, Bld 87 70 - 99 mg/dL    Comment: Glucose reference range applies only to samples taken after fasting for at least 8 hours.   BUN 15 6 - 20 mg/dL   Creatinine, Ser 0.73 0.61 - 1.24 mg/dL   Calcium 7.6 (L) 8.9 - 10.3 mg/dL   GFR, Estimated >60 >60 mL/min    Comment: (NOTE) Calculated using the CKD-EPI Creatinine Equation (2021)    Anion gap 9 5 - 15    Comment: Performed at [facility], [place] [address], [place], [place] [address]  Hemoglobin     Status: Abnormal   Collection Time: 09/04/21  1:00 PM  Result Value Ref Range   Hemoglobin 7.5 (L) 13.0 - 17.0 g/dL    Comment: Performed at [facility], [place] [address] [place], [place]  [address]  Hematocrit     Status: Abnormal   Collection Time: 09/04/21  1:00 PM  Result Value Ref Range   HCT 25.0 (L) 39.0 - 52.0 %    Comment: Performed at [facility], [place] [address], [place], [place] [address]  Hemoglobin     Status: Abnormal   Collection Time: 09/04/21  2:53 PM  Result Value Ref Range   Hemoglobin 7.7 (L) 13.0 - 17.0 g/dL    Comment: Performed at [facility], [place] [address], [place], [place] [address]  Hematocrit     Status: Abnormal   Collection Time: 09/04/21  2:53 PM  Result Value Ref Range   HCT 25.9 (L) 39.0 - 52.0 %    Comment: Performed at [facility], [place] [address], [place], [place] [address]  Heparin level (unfractionated)     Status: Abnormal   Collection Time: 09/04/21  6:55 PM  Result Value Ref Range   Heparin Unfractionated 0.11 (L) 0.30 - 0.70 IU/mL    Comment: (NOTE) The clinical reportable range upper limit is being lowered to >1.10 to align with the FDA approved guidance for the current laboratory assay.  If heparin results are below expected values, and patient dosage has  been confirmed, suggest follow up testing of antithrombin III levels. Performed at [facility], [place]  [address] [place] [address], [place], [place] [address]   Hemoglobin     Status: Abnormal   Collection Time: 09/04/21 11:10 PM  Result Value Ref Range   Hemoglobin 7.4 (L) 13.0 - 17.0 g/dL    Comment: Performed at [facility], [place] [address] [place], [place] [address]  Hematocrit     Status: Abnormal   Collection Time: 09/04/21 11:10 PM  Result Value Ref Range   HCT 24.6 (L) 39.0 - 52.0 %    Comment: Performed at [facility], [place] [address], [place], [place] [address]  Basic metabolic panel     Status: Abnormal   Collection Time: 09/05/21  3:13 AM  Result Value Ref Range   Sodium 140 135 - 145 mmol/L   Potassium 4.2 3.5 - 5.1 mmol/L   Chloride 109 98 - 111 mmol/L   CO2 25 22 - 32 mmol/L   Glucose, Bld 144 (H) 70 - 99 mg/dL    Comment: Glucose reference range applies only to samples taken after fasting for at least 8 hours.   BUN 14 6 - 20 mg/dL   Creatinine, Ser 0.72 0.61 - 1.24 mg/dL   Calcium 8.6 (L) 8.9 - 10.3 mg/dL   GFR, Estimated >60 >60 mL/min    Comment: (NOTE) Calculated using the CKD-EPI Creatinine Equation (2021)    Anion gap 6 5 - 15    Comment: Performed at [facility], [place] [address] [place], [place] [address]  Hemoglobin     Status: Abnormal   Collection Time: 09/05/21  3:13 AM  Result Value Ref Range   Hemoglobin 7.7 (L) 13.0 - 17.0 g/dL    Comment: Performed at [facility], [place] [address] [place],  [address]  Hematocrit     Status: Abnormal   Collection Time: 09/05/21  3:13 AM  Result Value Ref Range   HCT 26.4 (L) 39.0 - 52.0 %    Comment: Performed at [facility], [place] [address] [place], [place] [address]  Heparin level (unfractionated)     Status: Abnormal   Collection Time: 09/05/21  3:13 AM  Result Value Ref Range   Heparin Unfractionated 0.21 (L) 0.30 - 0.70 IU/mL    Comment: (NOTE) The clinical reportable range upper limit is being lowered to >1.10 to align  with the FDA approved guidance for the current laboratory assay.  If heparin results are below expected values, and patient dosage has  been confirmed, suggest follow up testing of antithrombin III levels. Performed at [facility], [place] [address], [place], [place] [address]     Assessment/Plan: Symptomatic anemia  WBC 11.2 HGB 7.7 (5.5)  after 2 PRBC Iron 7 Ferritin 5 B12 514 Started on iron therapy Continue to monitor H&H with transfusion as needed to maintain hemoglobin greater than 7.   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  Internal hemorrhoids. continue IV methylprednisone with plan to transition to PO out patient Checking with patient's insurance at this time to evaluate which biologic to start, have contacted Dr. Michail Sermon  his primary gastroenterologist Soft diet at this time, if worsening pain switch back to liquid diet.  Will get Cdiff testing due to ABX in march, has not had tested outpatient and for possible biologic use.    Acute DVT/PE Patient currently on heparin, being followed by primary team, will need to be transitioned to oral therapy outpatient Can consider coagulopathy work up outpatient but most likely related to colitis.     Vladimir Crofts PA-C 09/05/2021, 9:37 AM  Contact #  [phone]

## 2021-09-05 NOTE — Progress Notes (Signed)
Ambulated patient in hallway while monitoring O2 sats. Sats stayed between 98-100%. Patient tolerated ambulation well and denied shortness of breath/weakness.

## 2021-09-05 NOTE — Progress Notes (Signed)
ANTICOAGULATION CONSULT NOTE   Pharmacy Consult for Heparin infusion Indication: DVT  Allergies  Allergen Reactions   Ibuprofen Other (See Comments)    Causes Heartburn    Patient Measurements: Height: 5' 6"  (167.6 cm) Weight: 79.8 kg (176 lb) IBW/kg (Calculated) : 63.8 Heparin Dosing Weight: 79.8 kg  Vital Signs: Temp: 98.8 F (37.1 C) (10/13 0318) Temp Source: Oral (10/12 1813) BP: 116/88 (10/13 0318) Pulse Rate: 82 (10/13 0318)  Labs: Recent Labs    09/03/21 1719 09/03/21 2355 09/04/21 0520 09/04/21 1300 09/04/21 1453 09/04/21 1855 09/04/21 2310 09/05/21 0313  HGB 5.5*  --   --  7.5* 7.7*  --  7.4*  --   HCT 20.0*  --   --  25.0* 25.9*  --  24.6*  --   PLT 498*  --   --   --   --   --   --   --   HEPARINUNFRC  --   --   --   --   --  0.11*  --  0.21*  CREATININE 0.77  --  0.73  --   --   --   --  0.72  TROPONINIHS 3 5  --   --   --   --   --   --     Estimated Creatinine Clearance: 115.8 mL/min (by C-G formula based on SCr of 0.72 mg/dL).  Medications:  Scheduled:   sodium chloride   Intravenous Once   methylPREDNISolone (SOLU-MEDROL) injection  90 mg Intravenous BID   Infusions:   heparin 1,450 Units/hr (09/05/21 0236)   iron dextran (INFED/DEXFERRRUM) 500 MG IVPB 500 mg (09/04/21 2102)    Assessment: 41 yoM with colitis, GI bleed, new RLE DVT. Begin Heparin infusion, OK per GI Admit Hgb 5.5, transfused 2 units  Today, 09/05/2021: Heparin level = 0.21 (subtherapeutic) on 1450 units/hr Hgb low but stable/improved after PRBC x 2; Plt WNL SCr stable WNL, at baseline Per RN, pt does still noted scanty blood in stool earlier this evening on 10/12.  This AM RN reports no further BM; no further events noted.  Goal of Therapy:  Goal Heparin level - aiming for lower end of therapeutic range 0.3-0.5 unit/ml Monitor platelets by anticoagulation protocol: Yes   Plan:  Increase heparin infusion to 1650 units/hr Check 6 hr Heparin level Serial H/H per MD,  CBC daily from 10/14 Daily Heparin level at steady state   Rudi Bunyard, Toribio Harbour PharmD 09/05/2021,4:16 AM

## 2021-09-05 NOTE — Plan of Care (Signed)
  Problem: Education: Goal: Knowledge of General Education information will improve Description: Including pain rating scale, medication(s)/side effects and non-pharmacologic comfort measures Outcome: Progressing   Problem: Health Behavior/Discharge Planning: Goal: Ability to manage health-related needs will improve Outcome: Progressing   Problem: Clinical Measurements: Goal: Ability to maintain clinical measurements within normal limits will improve Outcome: Progressing Goal: Diagnostic test results will improve Outcome: Progressing

## 2021-09-05 NOTE — Progress Notes (Signed)
ANTICOAGULATION CONSULT NOTE   Pharmacy Consult for Heparin infusion Indication: DVT  Allergies  Allergen Reactions   Ibuprofen Other (See Comments)    Causes Heartburn    Patient Measurements: Height: 5' 6"  (167.6 cm) Weight: 79.8 kg (176 lb) IBW/kg (Calculated) : 63.8 Heparin Dosing Weight: 79.8 kg  Vital Signs: Temp: 99 F (37.2 C) (10/13 2023) Temp Source: Oral (10/13 2023) BP: 121/80 (10/13 2023) Pulse Rate: 83 (10/13 2023)  Labs: Recent Labs    09/03/21 1719 09/03/21 2355 09/04/21 0520 09/04/21 1300 09/04/21 2310 09/05/21 0313 09/05/21 1020 09/05/21 1522 09/05/21 2058  HGB 5.5*  --   --    < > 7.4* 7.7*  --  7.7*  --   HCT 20.0*  --   --    < > 24.6* 26.4*  --  25.8*  --   PLT 498*  --   --   --   --   --   --   --   --   HEPARINUNFRC  --   --   --    < >  --  0.21* 0.25*  --  0.27*  CREATININE 0.77  --  0.73  --   --  0.72  --   --   --   TROPONINIHS 3 5  --   --   --   --   --   --   --    < > = values in this interval not displayed.    Estimated Creatinine Clearance: 115.8 mL/min (by C-G formula based on SCr of 0.72 mg/dL).  Medications:  Scheduled:   sodium chloride   Intravenous Once   loratadine  10 mg Oral Daily   methylPREDNISolone (SOLU-MEDROL) injection  90 mg Intravenous BID   [START ON 09/06/2021] pantoprazole  40 mg Oral Q0600   ursodiol  300 mg Oral BID   Infusions:   heparin 1,800 Units/hr (09/05/21 1801)    Assessment: 63 yoM with colitis, GI bleed, new RLE DVT. Begin Heparin infusion, OK per GI Admit Hgb 5.5, transfused 2 units  Today, 09/05/2021: Heparin level = 0.27 (subtherapeutic) on 1800 units/hr Hgb low but stable/improved after PRBC x 2; Plt WNL SCr stable WNL, at baseline No obvious blood in stool per GI note this AM; No current overt bleeding issues per RN No infusion related issues per RN  Goal of Therapy:  Goal Heparin level - aiming for lower end of therapeutic range 0.3-0.5 unit/ml Monitor platelets by  anticoagulation protocol: Yes   Plan:  Increase heparin infusion to 1950 units/hr Check 6 hr Heparin level (~5a with morning labs) CBC & heparin level daily while on heparin  Netta Cedars PharmD 09/05/2021,9:52 PM

## 2021-09-05 NOTE — Progress Notes (Signed)
PROGRESS NOTE    Reginald Parker  ZWC:[id number] DOB: [birth year] DOA: 09/03/2021 PCP: Colon Branch, MD   Chief Complaint  Patient presents with   DVT left LE    Brief Narrative:  Patient 45 year old gentleman with medical history significant for Crohn's disease, successfully treated latent TB presented to the ED for left foot swelling diagnosed with a DVT  with noted swelling in bilateral foot and ankle after treatment the UV shielding July.  Patient seen by podiatry after recurrent left-sided swelling and pain x10 days, ultrasound obtained concerning for DVT in the gastrocnemius and posterior tibial DVT.  Patient also with complaints of bloody diarrhea after prednisone taper decreased to 5 mg with improvement with bloody diarrhea after prednisone increased back to 20 mg with some complaints of exertional shortness of breath.  CT angiogram chest done negative for large central PE or right heart strain but suspicious for small subsegmental PE at bilateral bases.  Patient also noted on presentation to have a hemoglobin of 5.5 and transfused 2 units packed red blood cells early this morning.  GI consulted and following.   Assessment & Plan:   Principal Problem:   Symptomatic anemia Active Problems:   Crohn's disease ([place])   GI bleeding   Left leg DVT ([place])   Pulmonary emboli ([facility])   Ulcerative pancolitis ([facility])   #1 symptomatic anemia, lower GI bleed/ulcerative pancolitis -Patient presented with symptomatic lower GI bleed, history of ulcerative pancolitis and noted to have a left calf DVT on outpatient ultrasound.  Patient states had a bloody bowel movement last night and brown stool this morning.  -Hemoglobin on presentation 5.5.  Status post transfusion 2 units packed red blood cells hemoglobin at 7.7 this morning. -Patient noted to have some diarrhea with bleeding then resolved about 4 days prior to admission however FOBT was positive in the ED and patient did have a bloody bowel the  morning of 09/05/2019 2 in the evening of 09/04/2021.  Bloody bowel movements improving. -Patient seen in consultation by GI and patient started on IV methylprednisolone and diet advanced to a soft diet. -IV iron ordered and currently infusing per GI. -Stool studies for C. difficile ordered per GI. -Per GI.  2.  Acute left lower extremity DVT/PE -Patient increased risk for DVT PE with recent COVID in March 2022, flare of ulcerative colitis since that time. -Patient started on IV methylprednisone for ulcerative pancolitis and cleared by GI for anticoagulation at this time. -Placed on heparin per pharmacy and H&H stable this morning posttransfusion currently at 7.7.   -Patient denies any significant bleeding states bloody bowel movements improving. -Once bleeding has stabilized with no further bleeding, H&H stable could likely transition to oral NOAC. -As this is first DVT/PE will likely need to be on anticoagulation anywhere from 3 to 6 months. -After completion of anticoagulation could likely undergo hypercoagulable work-up in the outpatient setting. -We will need outpatient follow-up.     DVT prophylaxis: Heparin Code Status: Full Family Communication: Updated patient.  No family at bedside.  Disposition:   Status is: Inpatient  Remains inpatient appropriate because:Inpatient level of care appropriate due to severity of illness  Dispo: The patient is from: Home              Anticipated d/c is to: Home              Patient currently is not medically stable to d/c.   Difficult to place patient No       Consultants:  Gastroenterology: Dr.Magod 09/04/2021  Procedures:  Lower extremity Dopplers 09/04/2021 2D echo 09/04/2021 Transfusion 2 units packed red blood cells 09/04/2021    Antimicrobials:  None   Subjective: Sitting up at the side of the bed.  States had a bloody bowel movement last night.  Had bowel movement this morning with very minimal blood per patient and  was loose.  Awaiting for stool studies to be done.  Denies any chest pain.  Denies any significant shortness of breath.  Stated ambulated in hallway.  Currently receiving IV iron.  Objective: Vitals:   09/04/21 1813 09/04/21 2128 09/05/21 0318 09/05/21 0537  BP: 125/84 123/78 116/88 130/83  Pulse: 90 97 82 85  Resp: 18 18 16 18   Temp: 98.2 F (36.8 C) 98.4 F (36.9 C) 98.8 F (37.1 C) 98.9 F (37.2 C)  TempSrc: Oral     SpO2: 100% 98% 97% 97%  Weight:      Height:        Intake/Output Summary (Last 24 hours) at 09/05/2021 1240 Last data filed at 09/05/2021 0539 Gross per 24 hour  Intake 645.14 ml  Output --  Net 645.14 ml    Filed Weights   09/03/21 1607  Weight: 79.8 kg    Examination:  General exam: : NAD Respiratory system: CTAB.  No wheezes, no rhonchi, no crackles.  Speaking in full sentences.  Normal respiratory effort. Cardiovascular system: Regular rate and rhythm no murmurs rubs or gallops.  No JVD.  Trace- 1+ left lower extremity edema. Gastrointestinal system: Abdomen soft, nontender, nondistended, positive bowel sounds.  No rebound.  No guarding. Central nervous system: Alert and oriented. No focal neurological deficits. Extremities: Symmetric 5 x 5 power. Skin: No rashes, lesions or ulcers Psychiatry: Judgement and insight appear normal. Mood & affect appropriate.   Data Reviewed: I have personally reviewed following labs and imaging studies  CBC: Recent Labs  Lab 09/03/21 1719 09/04/21 1300 09/04/21 1453 09/04/21 2310 09/05/21 0313  WBC 11.2*  --   --   --   --   NEUTROABS 8.2*  --   --   --   --   HGB 5.5* 7.5* 7.7* 7.4* 7.7*  HCT 20.0* 25.0* 25.9* 24.6* 26.4*  MCV 64.1*  --   --   --   --   PLT 498*  --   --   --   --      Basic Metabolic Panel: Recent Labs  Lab 09/03/21 1719 09/04/21 0520 09/05/21 0313  NA 136 136 140  K 4.5 3.9 4.2  CL 105 106 109  CO2 25 21* 25  GLUCOSE 129* 87 144*  BUN 16 15 14   CREATININE 0.77 0.73 0.72   CALCIUM 8.1* 7.6* 8.6*     GFR: Estimated Creatinine Clearance: 115.8 mL/min (by C-G formula based on SCr of 0.72 mg/dL).  Liver Function Tests: Recent Labs  Lab 09/03/21 1719  AST 15  ALT 17  ALKPHOS 75  BILITOT 0.4  PROT 7.1  ALBUMIN 3.0*     CBG: No results for input(s): GLUCAP in the last 168 hours.   Recent Results (from the past 240 hour(s))  Resp Panel by RT-PCR (Flu A&B, Covid) Nasopharyngeal Swab     Status: None   Collection Time: 09/03/21  8:24 PM   Specimen: Nasopharyngeal Swab; Nasopharyngeal(NP) swabs in vial transport medium  Result Value Ref Range Status   SARS Coronavirus 2 by RT PCR NEGATIVE NEGATIVE Final    Comment: (NOTE) SARS-CoV-2 target nucleic acids  are NOT DETECTED.  The SARS-CoV-2 RNA is generally detectable in upper respiratory specimens during the acute phase of infection. The lowest concentration of SARS-CoV-2 viral copies this assay can detect is 138 copies/mL. A negative result does not preclude SARS-Cov-2 infection and should not be used as the sole basis for treatment or other patient management decisions. A negative result may occur with  improper specimen collection/handling, submission of specimen other than nasopharyngeal swab, presence of viral mutation(s) within the areas targeted by this assay, and inadequate number of viral copies(<138 copies/mL). A negative result must be combined with clinical observations, patient history, and epidemiological information. The expected result is Negative.  Fact Sheet for Patients:  EntrepreneurPulse.com.au  Fact Sheet for Healthcare Providers:  IncredibleEmployment.be  This test is no t yet approved or cleared by the [place] FDA and  has been authorized for detection and/or diagnosis of SARS-CoV-2 by FDA under an Emergency Use Authorization (EUA). This EUA will remain  in effect (meaning this test can be used) for the duration of the COVID-19  declaration under Section 564(b)(1) of the Act, 21 U.S.C.section 360bbb-3(b)(1), unless the authorization is terminated  or revoked sooner.       Influenza A by PCR NEGATIVE NEGATIVE Final   Influenza B by PCR NEGATIVE NEGATIVE Final    Comment: (NOTE) The Xpert Xpress SARS-CoV-2/FLU/RSV plus assay is intended as an aid in the diagnosis of influenza from Nasopharyngeal swab specimens and should not be used as a sole basis for treatment. Nasal washings and aspirates are unacceptable for Xpert Xpress SARS-CoV-2/FLU/RSV testing.  Fact Sheet for Patients: EntrepreneurPulse.com.au  Fact Sheet for Healthcare Providers: IncredibleEmployment.be  This test is not yet approved or cleared by the [place] FDA and has been authorized for detection and/or diagnosis of SARS-CoV-2 by FDA under an Emergency Use Authorization (EUA). This EUA will remain in effect (meaning this test can be used) for the duration of the COVID-19 declaration under Section 564(b)(1) of the Act, 21 U.S.C. section 360bbb-3(b)(1), unless the authorization is terminated or revoked.  Performed at [facility], [address] [place], [place] [address]           Radiology Studies: CT Angio Chest PE W and/or Wo Contrast  Result Date: 09/03/2021 CLINICAL DATA:  Pulmonary embolus suspected with high probability. Left leg swelling. Positive ultrasound for DVT. Shortness of breath for 2 weeks. EXAM: CT ANGIOGRAPHY CHEST WITH CONTRAST TECHNIQUE: Multidetector CT imaging of the chest was performed using the standard protocol during bolus administration of intravenous contrast. Multiplanar CT image reconstructions and MIPs were obtained to evaluate the vascular anatomy. CONTRAST:  118m OMNIPAQUE IOHEXOL 350 MG/ML SOLN COMPARISON:  06/15/2010 FINDINGS: Cardiovascular: The contrast bolus is somewhat limited but there is moderately good visualization of the pulmonary arteries  to the segmental level. No large central filling defects are demonstrated. There is a filling defect demonstrated in a right lower lung subsegmental pulmonary artery and probably in a couple of peripheral left lower lobe pulmonary arteries. This likely indicates peripheral emboli. No evidence of right heart strain. RV to LV ratio is normal at 0.8. Normal heart size. No pericardial effusions. Normal caliber thoracic aorta. No aortic dissection. Great vessel origins are patent. Mediastinum/Nodes: Thyroid gland is unremarkable. Esophagus is decompressed. No significant lymphadenopathy. Lungs/Pleura: Mild dependent changes in the lung bases. Lungs are otherwise clear and expanded. No pleural effusions. No pneumothorax. Airways are patent. Upper Abdomen: Cholelithiasis with several stones in the gallbladder. No inflammatory changes or wall  thickening. Scattered lymph nodes are not pathologically enlarged, likely reactive. Musculoskeletal: No chest wall abnormality. No acute or significant osseous findings. Review of the MIP images confirms the above findings. IMPRESSION: 1. Contrast bolus to the pulmonary arteries is somewhat limited but there appear to be small subsegmental pulmonary emboli in both lung bases. No large central pulmonary embolus. No evidence of right heart strain. 2. Mild dependent atelectasis in the lung bases. Lungs are otherwise clear. 3. Cholelithiasis. Critical Value/emergent results were called by telephone at the time of interpretation on 09/03/2021 at 7:29 pm to provider Dr. Lester Kinsman, who verbally acknowledged these results. Electronically Signed   By: Lucienne Capers M.D.   On: 09/03/2021 19:35   US Venous Img Lower Unilateral Left (DVT)  Result Date: 09/03/2021 CLINICAL DATA:  Pain and swelling EXAM: LEFT LOWER EXTREMITY VENOUS DOPPLER ULTRASOUND TECHNIQUE: Gray-scale sonography with compression, as well as color and duplex ultrasound, were performed to evaluate the deep venous system(s)  from the level of the common femoral vein through the popliteal and proximal calf veins. COMPARISON:  None. FINDINGS: VENOUS The gastrocnemius vein and posterior tibial veins are noncompressible containing hypoechoic thrombus. Peroneal vein normal. Normal compressibility of the common femoral, superficial femoral, and popliteal veins . Visualized portions of profunda femoral vein and great saphenous vein unremarkable. Limited views of the contralateral common femoral vein are unremarkable. OTHER None. Limitations: none IMPRESSION: 1. POSITIVE for gastrocnemius and posterior tibial (calf) DVT without propagation above the knee. Electronically Signed   By: Lucrezia Europe M.D.   On: 09/03/2021 15:21   ECHOCARDIOGRAM COMPLETE  Result Date: 09/04/2021    ECHOCARDIOGRAM REPORT   Patient Name:   Reginald Parker Date of Exam: 09/04/2021 Medical Rec #:  [id number]         Height:       66.0 in Accession #:    [id number]        Weight:       176.0 lb Date of Birth:  Jan 04, 1976         BSA:          1.894 m Patient Age:    5 years          BP:           121/73 mmHg Patient Gender: M                 HR:           86 bpm. Exam Location:  Inpatient Procedure: 2D Echo, Cardiac Doppler and Color Doppler Indications:    Pulmonary Embolus I26.09  History:        Patient has prior history of Echocardiogram examinations, most                 recent 02/07/2019.  Sonographer:    Bernadene Person RDCS Referring Phys: [place]  1. Prominent LV apical band. Left ventricular ejection fraction, by estimation, is 60 to 65%. The left ventricle has normal function. The left ventricle has no regional wall motion abnormalities. Left ventricular diastolic parameters were normal.  2. Right ventricular systolic function is normal. The right ventricular size is normal.  3. The mitral valve is normal in structure. Trivial mitral valve regurgitation. No evidence of mitral stenosis.  4. The aortic valve is normal in structure.  Aortic valve regurgitation is not visualized. No aortic stenosis is present.  5. The inferior vena cava is normal in size with greater than 50% respiratory variability, suggesting right atrial  pressure of 3 mmHg. FINDINGS  Left Ventricle: Prominent LV apical band. Left ventricular ejection fraction, by estimation, is 60 to 65%. The left ventricle has normal function. The left ventricle has no regional wall motion abnormalities. The left ventricular internal cavity size was normal in size. There is no left ventricular hypertrophy. Left ventricular diastolic parameters were normal. Right Ventricle: The right ventricular size is normal. No increase in right ventricular wall thickness. Right ventricular systolic function is normal. Left Atrium: Left atrial size was normal in size. Right Atrium: Right atrial size was normal in size. Pericardium: There is no evidence of pericardial effusion. Mitral Valve: The mitral valve is normal in structure. Trivial mitral valve regurgitation. No evidence of mitral valve stenosis. Tricuspid Valve: The tricuspid valve is normal in structure. Tricuspid valve regurgitation is trivial. No evidence of tricuspid stenosis. Aortic Valve: The aortic valve is normal in structure. Aortic valve regurgitation is not visualized. No aortic stenosis is present. Pulmonic Valve: The pulmonic valve was normal in structure. Pulmonic valve regurgitation is not visualized. No evidence of pulmonic stenosis. Aorta: The aortic root is normal in size and structure. Venous: The inferior vena cava is normal in size with greater than 50% respiratory variability, suggesting right atrial pressure of 3 mmHg. IAS/Shunts: No atrial level shunt detected by color flow Doppler.  LEFT VENTRICLE PLAX 2D LVIDd:         5.00 cm   Diastology LVIDs:         3.40 cm   LV e' medial:    9.68 cm/s LV PW:         1.00 cm   LV E/e' medial:  8.8 LV IVS:        0.90 cm   LV e' lateral:   11.50 cm/s LVOT diam:     2.10 cm   LV E/e'  lateral: 7.4 LV SV:         66 LV SV Index:   35 LVOT Area:     3.46 cm  RIGHT VENTRICLE RV S prime:     14.80 cm/s TAPSE (M-mode): 2.9 cm LEFT ATRIUM             Index        RIGHT ATRIUM           Index LA diam:        3.00 cm 1.58 cm/m   RA Area:     18.40 cm LA Vol (A2C):   50.5 ml 26.66 ml/m  RA Volume:   50.10 ml  26.45 ml/m LA Vol (A4C):   47.2 ml 24.92 ml/m LA Biplane Vol: 52.2 ml 27.56 ml/m  AORTIC VALVE LVOT Vmax:   108.00 cm/s LVOT Vmean:  72.300 cm/s LVOT VTI:    0.190 m  AORTA Ao Asc diam: 3.50 cm MITRAL VALVE MV Area (PHT): 4.80 cm    SHUNTS MV Decel Time: 158 msec    Systemic VTI:  0.19 m MV E velocity: 84.80 cm/s  Systemic Diam: 2.10 cm MV A velocity: 67.30 cm/s MV E/A ratio:  1.26 Jenkins Rouge MD Electronically signed by Jenkins Rouge MD Signature Date/Time: 09/04/2021/3:38:52 PM    Final    VAS [place] LOWER EXTREMITY VENOUS (DVT)  Result Date: 09/04/2021  Lower Venous DVT Study Patient Name:  Reginald Parker  Date of Exam:   09/04/2021 Medical Rec #: [id number]          Accession #:    [id number] Date of Birth: 21-Mar-1976  Patient Gender: M Patient Age:   52 years Exam Location:  [facility] Procedure:      VAS [place] LOWER EXTREMITY VENOUS (DVT) Referring Phys: TIMOTHY OPYD --------------------------------------------------------------------------------  Indications: Edema.  Risk Factors: DVT. Comparison Study: 09/03/2021 - POSITIVE for gastrocnemius and posterior tibial                   (calf) DVT                   without propagation above the knee. Performing Technologist: Oliver Hum RVT  Examination Guidelines: A complete evaluation includes B-mode imaging, spectral Doppler, color Doppler, and power Doppler as needed of all accessible portions of each vessel. Bilateral testing is considered an integral part of a complete examination. Limited examinations for reoccurring indications may be performed as noted. The reflux portion of the exam is performed with the  patient in reverse Trendelenburg.  +---------+---------------+---------+-----------+----------+--------------+ RIGHT    CompressibilityPhasicitySpontaneityPropertiesThrombus Aging +---------+---------------+---------+-----------+----------+--------------+ CFV      Full           Yes      Yes                                 +---------+---------------+---------+-----------+----------+--------------+ SFJ      Full                                                        +---------+---------------+---------+-----------+----------+--------------+ FV Prox  Full                                                        +---------+---------------+---------+-----------+----------+--------------+ FV Mid   Full                                                        +---------+---------------+---------+-----------+----------+--------------+ FV DistalFull                                                        +---------+---------------+---------+-----------+----------+--------------+ PFV      Full                                                        +---------+---------------+---------+-----------+----------+--------------+ POP      Full           Yes      Yes                                 +---------+---------------+---------+-----------+----------+--------------+ PTV      Full                                                        +---------+---------------+---------+-----------+----------+--------------+  PERO     Full                                                        +---------+---------------+---------+-----------+----------+--------------+   +----+---------------+---------+-----------+----------+--------------+ LEFTCompressibilityPhasicitySpontaneityPropertiesThrombus Aging +----+---------------+---------+-----------+----------+--------------+ CFV Full           Yes      Yes                                  +----+---------------+---------+-----------+----------+--------------+    Summary: RIGHT: - There is no evidence of deep vein thrombosis in the lower extremity.  - No cystic structure found in the popliteal fossa.  LEFT: - No evidence of common femoral vein obstruction.  *See table(s) above for measurements and observations. Electronically signed by Harold Barban MD on 09/04/2021 at 9:22:10 PM.    Final         Scheduled Meds:  sodium chloride   Intravenous Once   loratadine  10 mg Oral Daily   methylPREDNISolone (SOLU-MEDROL) injection  90 mg Intravenous BID   ursodiol  500 mg Oral BID   Continuous Infusions:  heparin 1,650 Units/hr (09/05/21 0427)   iron dextran (INFED/DEXFERRRUM) 500 MG IVPB 500 mg (09/05/21 1049)     LOS: 2 days    Time spent: 40 minutes    Irine Seal, MD [facility]   To contact the attending provider between 7A-7P or the covering provider during after hours 7P-7A, please log into the web site [email or web address] and access using universal [place] password for that web site. If you do not have the password, please call the hospital operator.  09/05/2021, 12:40 PM

## 2021-09-05 NOTE — Progress Notes (Addendum)
ANTICOAGULATION CONSULT NOTE   Pharmacy Consult for Heparin infusion Indication: DVT  Allergies  Allergen Reactions   Ibuprofen Other (See Comments)    Causes Heartburn    Patient Measurements: Height: 5' 6"  (167.6 cm) Weight: 79.8 kg (176 lb) IBW/kg (Calculated) : 63.8 Heparin Dosing Weight: 79.8 kg  Vital Signs: Temp: 98.7 F (37.1 C) (10/13 1315) Temp Source: Oral (10/13 1315) BP: 125/82 (10/13 1315) Pulse Rate: 82 (10/13 1315)  Labs: Recent Labs    09/03/21 1719 09/03/21 2355 09/04/21 0520 09/04/21 1300 09/04/21 1453 09/04/21 1855 09/04/21 2310 09/05/21 0313 09/05/21 1020  HGB 5.5*  --   --    < > 7.7*  --  7.4* 7.7*  --   HCT 20.0*  --   --    < > 25.9*  --  24.6* 26.4*  --   PLT 498*  --   --   --   --   --   --   --   --   HEPARINUNFRC  --   --   --   --   --  0.11*  --  0.21* 0.25*  CREATININE 0.77  --  0.73  --   --   --   --  0.72  --   TROPONINIHS 3 5  --   --   --   --   --   --   --    < > = values in this interval not displayed.    Estimated Creatinine Clearance: 115.8 mL/min (by C-G formula based on SCr of 0.72 mg/dL).  Medications:  Scheduled:   sodium chloride   Intravenous Once   loratadine  10 mg Oral Daily   methylPREDNISolone (SOLU-MEDROL) injection  90 mg Intravenous BID   [START ON 09/06/2021] pantoprazole  40 mg Oral Q0600   ursodiol  300 mg Oral BID   Infusions:   heparin 1,650 Units/hr (09/05/21 0427)    Assessment: 25 yoM with colitis, GI bleed, new RLE DVT. Begin Heparin infusion, OK per GI Admit Hgb 5.5, transfused 2 units  Today, 09/05/2021: Heparin level = 0.25 (subtherapeutic) on 1650 units/hr Hgb low but stable/improved after PRBC x 2; Plt WNL SCr stable WNL, at baseline Some bleeding from left AC IV which has resolved and what appears to be red streaked blood for C diff sample per RN No obvious blood in stool per GI note this AM  Goal of Therapy:  Goal Heparin level - aiming for lower end of therapeutic range  0.3-0.5 unit/ml Monitor platelets by anticoagulation protocol: Yes   Plan:  Increase heparin infusion to 1650 units/hr Check 6 hr Heparin level Serial H/H per MD, CBC daily from 10/14 Daily Heparin level at steady state   Ulice Dash D PharmD 09/05/2021,2:34 PM

## 2021-09-06 DIAGNOSIS — D72829 Elevated white blood cell count, unspecified: Secondary | ICD-10-CM

## 2021-09-06 LAB — CBC WITH DIFFERENTIAL/PLATELET
Abs Immature Granulocytes: 0.99 10*3/uL — ABNORMAL HIGH (ref 0.00–0.07)
Basophils Absolute: 0.1 10*3/uL (ref 0.0–0.1)
Basophils Relative: 0 %
Eosinophils Absolute: 0 10*3/uL (ref 0.0–0.5)
Eosinophils Relative: 0 %
HCT: 28 % — ABNORMAL LOW (ref 39.0–52.0)
Hemoglobin: 8.2 g/dL — ABNORMAL LOW (ref 13.0–17.0)
Immature Granulocytes: 5 %
Lymphocytes Relative: 14 %
Lymphs Abs: 2.7 10*3/uL (ref 0.7–4.0)
MCH: 21.3 pg — ABNORMAL LOW (ref 26.0–34.0)
MCHC: 29.3 g/dL — ABNORMAL LOW (ref 30.0–36.0)
MCV: 72.7 fL — ABNORMAL LOW (ref 80.0–100.0)
Monocytes Absolute: 2 10*3/uL — ABNORMAL HIGH (ref 0.1–1.0)
Monocytes Relative: 10 %
Neutro Abs: 13.9 10*3/uL — ABNORMAL HIGH (ref 1.7–7.7)
Neutrophils Relative %: 71 %
Platelets: 472 10*3/uL — ABNORMAL HIGH (ref 150–400)
RBC: 3.85 MIL/uL — ABNORMAL LOW (ref 4.22–5.81)
RDW: 26.9 % — ABNORMAL HIGH (ref 11.5–15.5)
WBC: 19.6 10*3/uL — ABNORMAL HIGH (ref 4.0–10.5)
nRBC: 9.4 % — ABNORMAL HIGH (ref 0.0–0.2)

## 2021-09-06 LAB — CBC
HCT: 26.6 % — ABNORMAL LOW (ref 39.0–52.0)
Hemoglobin: 7.9 g/dL — ABNORMAL LOW (ref 13.0–17.0)
MCH: 21 pg — ABNORMAL LOW (ref 26.0–34.0)
MCHC: 29.7 g/dL — ABNORMAL LOW (ref 30.0–36.0)
MCV: 70.6 fL — ABNORMAL LOW (ref 80.0–100.0)
Platelets: 433 10*3/uL — ABNORMAL HIGH (ref 150–400)
RBC: 3.77 MIL/uL — ABNORMAL LOW (ref 4.22–5.81)
RDW: 25.5 % — ABNORMAL HIGH (ref 11.5–15.5)
WBC: 19.7 10*3/uL — ABNORMAL HIGH (ref 4.0–10.5)
nRBC: 2.9 % — ABNORMAL HIGH (ref 0.0–0.2)

## 2021-09-06 LAB — BASIC METABOLIC PANEL
Anion gap: 6 (ref 5–15)
BUN: 16 mg/dL (ref 6–20)
CO2: 24 mmol/L (ref 22–32)
Calcium: 8.3 mg/dL — ABNORMAL LOW (ref 8.9–10.3)
Chloride: 104 mmol/L (ref 98–111)
Creatinine, Ser: 0.83 mg/dL (ref 0.61–1.24)
GFR, Estimated: >60 mL/min (ref >60)
Glucose, Bld: 144 mg/dL — ABNORMAL HIGH (ref 70–99)
Potassium: 3.6 mmol/L (ref 3.5–5.1)
Sodium: 134 mmol/L — ABNORMAL LOW (ref 135–145)

## 2021-09-06 LAB — HEPARIN LEVEL (UNFRACTIONATED): Heparin Unfractionated: 0.56 IU/mL (ref 0.30–0.70)

## 2021-09-06 MED ORDER — PREDNISONE 20 MG PO TABS
40.0000 mg | ORAL_TABLET | Freq: Every day | ORAL | Status: DC
  Administered 2021-09-06 – 2021-09-08 (×3): 40 mg via ORAL
  Filled 2021-09-06 (×3): qty 2

## 2021-09-06 MED ORDER — POTASSIUM CHLORIDE CRYS ER 20 MEQ PO TBCR
20.0000 meq | EXTENDED_RELEASE_TABLET | Freq: Once | ORAL | Status: AC
  Administered 2021-09-06: 20 meq via ORAL
  Filled 2021-09-06: qty 1

## 2021-09-06 MED ORDER — APIXABAN 5 MG PO TABS
10.0000 mg | ORAL_TABLET | Freq: Two times a day (BID) | ORAL | Status: DC
  Administered 2021-09-06 (×2): 10 mg via ORAL
  Filled 2021-09-06 (×4): qty 2

## 2021-09-06 MED ORDER — APIXABAN 5 MG PO TABS: 5.0000 mg | ORAL_TABLET | Freq: Two times a day (BID) | ORAL | Status: DC

## 2021-09-06 NOTE — Progress Notes (Signed)
ANTICOAGULATION CONSULT NOTE   Pharmacy Consult for Heparin infusion Indication: DVT  Allergies  Allergen Reactions   Ibuprofen Other (See Comments)    Causes Heartburn    Patient Measurements: Height: 5' 6"  (167.6 cm) Weight: 79.8 kg (176 lb) IBW/kg (Calculated) : 63.8 Heparin Dosing Weight: 79.8 kg  Vital Signs: Temp: 99 F (37.2 C) (10/13 2023) Temp Source: Oral (10/13 2023) BP: 121/80 (10/13 2023) Pulse Rate: 83 (10/13 2023)  Labs: Recent Labs    09/03/21 1719 09/03/21 2355 09/04/21 0520 09/04/21 1300 09/05/21 0313 09/05/21 1020 09/05/21 1522 09/05/21 2058 09/06/21 0436  HGB 5.5*  --   --    < > 7.7*  --  7.7*  --  7.9*  HCT 20.0*  --   --    < > 26.4*  --  25.8*  --  26.6*  PLT 498*  --   --   --   --   --   --   --  433*  HEPARINUNFRC  --   --   --    < > 0.21* 0.25*  --  0.27* 0.56  CREATININE 0.77  --  0.73  --  0.72  --   --   --   --   TROPONINIHS 3 5  --   --   --   --   --   --   --    < > = values in this interval not displayed.    Estimated Creatinine Clearance: 115.8 mL/min (by C-G formula based on SCr of 0.72 mg/dL).  Medications:  Scheduled:   sodium chloride   Intravenous Once   loratadine  10 mg Oral Daily   methylPREDNISolone (SOLU-MEDROL) injection  90 mg Intravenous BID   pantoprazole  40 mg Oral Q0600   ursodiol  300 mg Oral BID   Infusions:   heparin 1,950 Units/hr (09/05/21 2203)    Assessment: 31 yoM with colitis, GI bleed, new RLE DVT. Begin Heparin infusion, OK per GI Admit Hgb 5.5, transfused 2 units  Today, 09/06/2021: Heparin level = 0.56- therapeutic at upper end of goal range on IV heparin 1800 units/hr Hgb low but stable/improved after PRBC x 2; Plt WNL SCr stable WNL, at baseline No obvious blood in stool per GI note this AM; No current overt bleeding issues per RN No infusion related issues per RN  Goal of Therapy:  Goal Heparin level - aiming for lower end of therapeutic range 0.3-0.5 unit/ml Monitor platelets  by anticoagulation protocol: Yes   Plan:  Decrease heparin infusion slightly to 1850 units/hr Recheck 6 hr Heparin level after rate change CBC & heparin level daily while on heparin  Netta Cedars PharmD 09/06/2021,5:15 AM

## 2021-09-06 NOTE — TOC Benefit Eligibility Note (Signed)
Transition of Care [facility]) Benefit Eligibility Note    Patient Details  Name: Reginald Parker MRN: [id number] Date of Birth: 01-04-1976   Medication/Dose: Eliquis 95m po qd x 30 days  Covered?: Yes  Tier: 3 Drug  Prescription Coverage Preferred Pharmacy: local  Spoke with Person/Company/Phone Number:: [phone] Co-Pay: $40.00  Prior Approval: No  Deductible: Met       FKerin SalenPhone Number: 09/06/2021, 12:33 PM

## 2021-09-06 NOTE — Progress Notes (Signed)
[facility] for Heparin infusion >> Eliquis Indication: DVT, possible PE  Allergies  Allergen Reactions   Ibuprofen Other (See Comments)    Causes Heartburn    Patient Measurements: Height: 5' 6"  (167.6 cm) Weight: 79.8 kg (176 lb) IBW/kg (Calculated) : 63.8 Heparin Dosing Weight: 79.8 kg  Vital Signs: Temp: 97.8 F (36.6 C) (10/14 0529) Temp Source: Oral (10/14 0529) BP: 114/73 (10/14 0529) Pulse Rate: 77 (10/14 0529)  Labs: Recent Labs    09/03/21 1719 09/03/21 2355 09/04/21 0520 09/04/21 1300 09/05/21 0313 09/05/21 1020 09/05/21 1522 09/05/21 2058 09/06/21 0436  HGB 5.5*  --   --    < > 7.7*  --  7.7*  --  7.9*  HCT 20.0*  --   --    < > 26.4*  --  25.8*  --  26.6*  PLT 498*  --   --   --   --   --   --   --  433*  HEPARINUNFRC  --   --   --    < > 0.21* 0.25*  --  0.27* 0.56  CREATININE 0.77  --  0.73  --  0.72  --   --   --  0.83  TROPONINIHS 3 5  --   --   --   --   --   --   --    < > = values in this interval not displayed.    Estimated Creatinine Clearance: 111.6 mL/min (by C-G formula based on SCr of 0.83 mg/dL).  Medications:  Scheduled:   sodium chloride   Intravenous Once   apixaban  10 mg Oral BID   Followed by   Derrill Memo ON 09/10/2021] apixaban  5 mg Oral BID   loratadine  10 mg Oral Daily   pantoprazole  40 mg Oral Q0600   predniSONE  40 mg Oral Q breakfast   ursodiol  300 mg Oral BID   Infusions:   heparin 1,850 Units/hr (09/06/21 0800)    Assessment: 15 yoM with colitis, GI bleed, new RLE DVT. Begin Heparin infusion, OK per GI Admit Hgb 5.5, transfused 2 units  Today 10/14 Hgb remains stable, plan to transition to Eliquis    Plan:  Transition to Eliquis 10 mg bid x 4 days followed by 5 mg bid thereafter (abbreviated loading phase as on day 3 of heparin and symptomatic anemia requiring transfusion) F/U CBC in AM, s/s of bleeding   Ulice Dash D PharmD 09/06/2021,9:57 AM

## 2021-09-06 NOTE — Progress Notes (Signed)
[facility] Gastroenterology Progress Note  Reginald Parker 45 y.o. 29-Sep-1976  CC:  Symptomatic anemia, ulcerative pancolitis, PBS, acute DVT/PE  Subjective: Patient soft diet, doing well.  Denies nausea vomiting.  Patient has had 2 episodes of bowel movements without blood, still loose.  And 1 episode of loose bowel movement around 1:30 AM with small light red blood. Patient had 1 episode of lower abdominal cramping around 4 AM after walking the hall, otherwise denies abdominal pain. Denies fever, chills, shortness of breath, chest pain. He feels the swelling in his leg is improved.  ROS : Review of Systems  Constitutional:  Positive for malaise/fatigue. Negative for chills and fever.  Respiratory:  Negative for shortness of breath.   Cardiovascular:  Positive for leg swelling (improving). Negative for chest pain and palpitations.  Gastrointestinal:  Positive for blood in stool and diarrhea. Negative for abdominal pain, constipation, heartburn, melena, nausea and vomiting.  Musculoskeletal:  Negative for falls.  Neurological:  Negative for dizziness.    Objective: Vital signs in last 24 hours: Vitals:   09/05/21 2023 09/06/21 0529  BP: 121/80 114/73  Pulse: 83 77  Resp: 16 18  Temp: 99 F (37.2 C) 97.8 F (36.6 C)  SpO2: 97% 97%    Physical Exam: General:   Alert, in NAD Heart:  Regular rate and rhythm; no murmurs Pulm: Clear anteriorly; no wheezing Abdomen:  Soft, Obese AB, skin exam normal, Normal bowel sounds.  no  tenderness  noted on exam . Without guarding and Without rebound, without hepatomegaly. Extremities:  Without edema. Neurologic:  Alert and  oriented x4;  grossly normal neurologically. Psych:  Alert and cooperative. Normal mood and affect.   Lab Results: Recent Labs    09/05/21 0313 09/06/21 0436  NA 140 134*  K 4.2 3.6  CL 109 104  CO2 25 24  GLUCOSE 144* 144*  BUN 14 16  CREATININE 0.72 0.83  CALCIUM 8.6* 8.3*    Recent Labs    09/03/21 1719   AST 15  ALT 17  ALKPHOS 75  BILITOT 0.4  PROT 7.1  ALBUMIN 3.0*    Recent Labs    09/03/21 1719 09/04/21 1300 09/05/21 1522 09/06/21 0436  WBC 11.2*  --   --  19.7*  NEUTROABS 8.2*  --   --   --   HGB 5.5*   < > 7.7* 7.9*  HCT 20.0*   < > 25.8* 26.6*  MCV 64.1*  --   --  70.6*  PLT 498*  --   --  433*   < > = values in this interval not displayed.    No results for input(s): LABPROT, INR in the last 72 hours.  Lab Results: Results for orders placed or performed during the hospital encounter of 09/03/21 (from the past 48 hour(s))  Hemoglobin     Status: Abnormal   Collection Time: 09/04/21  1:00 PM  Result Value Ref Range   Hemoglobin 7.5 (L) 13.0 - 17.0 g/dL    Comment: Performed at [facility], [place] [address] [place], [place] [address]  Hematocrit     Status: Abnormal   Collection Time: 09/04/21  1:00 PM  Result Value Ref Range   HCT 25.0 (L) 39.0 - 52.0 %    Comment: Performed at [facility], [place] [address] [place], [place] [address]  Hemoglobin     Status: Abnormal   Collection Time: 09/04/21  2:53 PM  Result Value Ref Range   Hemoglobin 7.7 (  L) 13.0 - 17.0 g/dL    Comment: Performed at [facility], [place] [address] [place], [place] [address]  Hematocrit     Status: Abnormal   Collection Time: 09/04/21  2:53 PM  Result Value Ref Range   HCT 25.9 (L) 39.0 - 52.0 %    Comment: Performed at [facility], [place] [address] [place], [place] [address]  Heparin level (unfractionated)     Status: Abnormal   Collection Time: 09/04/21  6:55 PM  Result Value Ref Range   Heparin Unfractionated 0.11 (L) 0.30 - 0.70 IU/mL    Comment: (NOTE) The clinical reportable range upper limit is being lowered to >1.10 to align with the FDA approved guidance for the current laboratory assay.  If heparin results are below expected values, and patient dosage has  been confirmed, suggest follow up testing  of antithrombin III levels. Performed at [facility], [place] [address], [place], [place] [address]   Hemoglobin     Status: Abnormal   Collection Time: 09/04/21 11:10 PM  Result Value Ref Range   Hemoglobin 7.4 (L) 13.0 - 17.0 g/dL    Comment: Performed at [facility], [place] [address] [place], [place] [address]  Hematocrit     Status: Abnormal   Collection Time: 09/04/21 11:10 PM  Result Value Ref Range   HCT 24.6 (L) 39.0 - 52.0 %    Comment: Performed at [facility], [place] [address], [place], [place] [address]  Basic metabolic panel     Status: Abnormal   Collection Time: 09/05/21  3:13 AM  Result Value Ref Range   Sodium 140 135 - 145 mmol/L   Potassium 4.2 3.5 - 5.1 mmol/L   Chloride 109 98 - 111 mmol/L   CO2 25 22 - 32 mmol/L   Glucose, Bld 144 (H) 70 - 99 mg/dL    Comment: Glucose reference range applies only to samples taken after fasting for at least 8 hours.   BUN 14 6 - 20 mg/dL   Creatinine, Ser 0.72 0.61 - 1.24 mg/dL   Calcium 8.6 (L) 8.9 - 10.3 mg/dL   GFR, Estimated >60 >60 mL/min    Comment: (NOTE) Calculated using the CKD-EPI Creatinine Equation (2021)    Anion gap 6 5 - 15    Comment: Performed at [facility], [place] [address], [place], [place] [address]  Hemoglobin     Status: Abnormal   Collection Time: 09/05/21  3:13 AM  Result Value Ref Range   Hemoglobin 7.7 (L) 13.0 - 17.0 g/dL    Comment: Performed at [facility], [place] [address] [place], [place] [address]  Hematocrit     Status: Abnormal   Collection Time: 09/05/21  3:13 AM  Result Value Ref Range   HCT 26.4 (L) 39.0 - 52.0 %    Comment: Performed at [facility], [place] [address], [place], [place] [address]  Heparin level (unfractionated)     Status: Abnormal   Collection Time: 09/05/21  3:13 AM  Result Value Ref Range   Heparin Unfractionated 0.21 (L) 0.30 - 0.70 IU/mL    Comment:  (NOTE) The clinical reportable range upper limit is being lowered to >1.10 to align with the FDA approved guidance for the current laboratory assay.  If heparin results are below expected values, and patient dosage has  been confirmed, suggest follow up testing of antithrombin III levels. Performed at [facility],  [place] [address] [place], [place] [address]   Heparin level (unfractionated)     Status: Abnormal   Collection Time: 09/05/21 10:20 AM  Result Value Ref Range   Heparin Unfractionated 0.25 (L) 0.30 - 0.70 IU/mL    Comment: (NOTE) The clinical reportable range upper limit is being lowered to >1.10 to align with the FDA approved guidance for the current laboratory assay.  If heparin results are below expected values, and patient dosage has  been confirmed, suggest follow up testing of antithrombin III levels. Performed at [facility], [place] [address] [place], [place] [address]   C Difficile Quick Screen w PCR reflex     Status: None   Collection Time: 09/05/21 12:56 PM   Specimen: STOOL  Result Value Ref Range   C Diff antigen NEGATIVE NEGATIVE   C Diff toxin NEGATIVE NEGATIVE   C Diff interpretation No C. difficile detected.     Comment: Performed at [facility], [place] [address], [place], [place] [address]  Hemoglobin     Status: Abnormal   Collection Time: 09/05/21  3:22 PM  Result Value Ref Range   Hemoglobin 7.7 (L) 13.0 - 17.0 g/dL    Comment: Performed at [facility], [place] [address], [place], [place] [address]  Hematocrit     Status: Abnormal   Collection Time: 09/05/21  3:22 PM  Result Value Ref Range   HCT 25.8 (L) 39.0 - 52.0 %    Comment: Performed at [facility], [place] [address] [place], [place] [address]  Heparin level (unfractionated)     Status: Abnormal   Collection Time: 09/05/21  8:58 PM  Result Value Ref Range   Heparin Unfractionated 0.27 (L) 0.30 -  0.70 IU/mL    Comment: (NOTE) The clinical reportable range upper limit is being lowered to >1.10 to align with the FDA approved guidance for the current laboratory assay.  If heparin results are below expected values, and patient dosage has  been confirmed, suggest follow up testing of antithrombin III levels. Performed at [facility], [place] [address] [place], [place] [address]   Basic metabolic panel     Status: Abnormal   Collection Time: 09/06/21  4:36 AM  Result Value Ref Range   Sodium 134 (L) 135 - 145 mmol/L   Potassium 3.6 3.5 - 5.1 mmol/L   Chloride 104 98 - 111 mmol/L   CO2 24 22 - 32 mmol/L   Glucose, Bld 144 (H) 70 - 99 mg/dL    Comment: Glucose reference range applies only to samples taken after fasting for at least 8 hours.   BUN 16 6 - 20 mg/dL   Creatinine, Ser 0.83 0.61 - 1.24 mg/dL   Calcium 8.3 (L) 8.9 - 10.3 mg/dL   GFR, Estimated >60 >60 mL/min    Comment: (NOTE) Calculated using the CKD-EPI Creatinine Equation (2021)    Anion gap 6 5 - 15    Comment: Performed at [facility], [place] [address] [place], [place] [address]  CBC     Status: Abnormal   Collection Time: 09/06/21  4:36 AM  Result Value Ref Range   WBC 19.7 (H) 4.0 - 10.5 K/uL   RBC 3.77 (L) 4.22 - 5.81 MIL/uL   Hemoglobin 7.9 (L) 13.0 - 17.0 g/dL    Comment: Reticulocyte Hemoglobin testing may be clinically indicated, consider ordering this additional test [id number]    HCT 26.6 (L) 39.0 - 52.0 %  MCV 70.6 (L) 80.0 - 100.0 fL   MCH 21.0 (L) 26.0 - 34.0 pg   MCHC 29.7 (L) 30.0 - 36.0 g/dL   RDW 25.5 (H) 11.5 - 15.5 %   Platelets 433 (H) 150 - 400 K/uL   nRBC 2.9 (H) 0.0 - 0.2 %    Comment: Performed at [facility], [place] [address], [place], [place] [address]  Heparin level (unfractionated)     Status: None   Collection Time: 09/06/21  4:36 AM  Result Value Ref Range   Heparin Unfractionated 0.56 0.30 - 0.70 IU/mL    Comment:  (NOTE) The clinical reportable range upper limit is being lowered to >1.10 to align with the FDA approved guidance for the current laboratory assay.  If heparin results are below expected values, and patient dosage has  been confirmed, suggest follow up testing of antithrombin III levels. Performed at [facility], [place] [address] [place], [place] [address]     Assessment/Plan: Symptomatic anemia  WBC 11.2 HGB 7.7 (5.5)  after 2 PRBC Iron 7 Ferritin 5 B12 514 Started on iron therapy Continue to monitor H&H with transfusion as needed to maintain hemoglobin greater than 7.   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  Internal hemorrhoids. IV methylprednisone will transition to prednisone 40 mg daily for 1 week, 30 mg daily for 1 week and then 25 mg daily until patient is able to get on biologic Currently rechecking with patient's insurance on Entyvio, Dr. Kathline Magic preferred biologic for this patient.  If not can consider Humira. Will suggest repeat DEXA scan outpatient  GERD We will discharge patient on pantoprazole 40 mg once daily   Acute DVT/PE Patient currently on heparin, being followed by primary team, will need to be transitioned to oral therapy outpatient for 6 months Can consider coagulopathy work up outpatient after completed course  From a GI perspective patient can be discharged on p.o. prednisone taper and pantoprazole with close follow-up in the office for initiation of a biologic.    Vladimir Crofts PA-C 09/06/2021, 11:09 AM  Contact #  [phone]

## 2021-09-06 NOTE — Progress Notes (Addendum)
PROGRESS NOTE    Reginald Parker  YKZ:[id number] DOB: February 13, 1976 DOA: 09/03/2021 PCP: Colon Branch, MD   Chief Complaint  Patient presents with   DVT left LE    Brief Narrative:  Patient 45 year old gentleman with medical history significant for Crohn's disease, successfully treated latent TB presented to the ED for left foot swelling diagnosed with a DVT  with noted swelling in bilateral foot and ankle after treatment the UV shielding July.  Patient seen by podiatry after recurrent left-sided swelling and pain x10 days, ultrasound obtained concerning for DVT in the gastrocnemius and posterior tibial DVT.  Patient also with complaints of bloody diarrhea after prednisone taper decreased to 5 mg with improvement with bloody diarrhea after prednisone increased back to 20 mg with some complaints of exertional shortness of breath.  CT angiogram chest done negative for large central PE or right heart strain but suspicious for small subsegmental PE at bilateral bases.  Patient also noted on presentation to have a hemoglobin of 5.5 and transfused 2 units packed red blood cells early this morning.  GI consulted and following.   Assessment & Plan:   Principal Problem:   Symptomatic anemia Active Problems:   Crohn's disease (HCC)   GI bleeding   Left leg DVT (HCC)   Pulmonary emboli (HCC)   Ulcerative pancolitis (HCC)   1 symptomatic anemia, lower GI bleed/ulcerative pancolitis -Patient presented with symptomatic lower GI bleed, history of ulcerative pancolitis and noted to have a left calf DVT on outpatient ultrasound.  Patient states had a bloody bowel movement last night and brown stool this morning.  -Hemoglobin on presentation 5.5.  Status post transfusion 2 units packed red blood cells hemoglobin at 7.9 this morning. -Patient noted to have some diarrhea with bleeding then resolved about 4 days prior to admission however FOBT was positive in the ED and patient did have a bloody bowel the  morning of 09/04/2021 in the evening of 09/04/2021.  Bloody bowel movements improving with no further bleeding noted today per patient. -Status post IV iron. -Patient placed on IV methylprednisone and transitioned to oral prednisone per GI. -C. difficile PCR negative. -Per GI patient likely needs oral iron supplementation on discharge and will need to continue prednisone taper until he has been started on biologic agent. -Supportive care. -Per GI.  2.  Acute left lower extremity DVT/PE -Patient increased risk for DVT PE with recent COVID in March 2022, flare of ulcerative colitis since that time. -Patient started on IV methylprednisone for ulcerative pancolitis and cleared by GI for anticoagulation at this time. -Placed on heparin per pharmacy and H&H stable this morning posttransfusion currently at 7.9.   -Patient denies any significant bleeding states bloody bowel movements improving. -2D echo with normal EF, no right ventricular strain noted.  Right lower extremity Dopplers negative for DVT -Hemoglobin seems to be stabilizing and as such we will transition from heparin to Eliquis.  -As this is first DVT/PE will likely need to be on anticoagulation anywhere from 3 to 6 months. -After completion of anticoagulation could likely undergo hypercoagulable work-up in the outpatient setting. -Outpatient follow-up.  3.  Leukocytosis -Likely steroid-induced. -Outpatient follow-up.     DVT prophylaxis: Heparin>>>>> Eliquis Code Status: Full Family Communication: Updated patient.  No family at bedside.  Disposition:   Status is: Inpatient  Remains inpatient appropriate because:Inpatient level of care appropriate due to severity of illness  Dispo: The patient is from: Home  Anticipated d/c is to: Home              Patient currently is not medically stable to d/c.   Difficult to place patient No       Consultants:  Gastroenterology: Dr.Magod 09/04/2021  Procedures:   Lower extremity Dopplers 09/04/2021 2D echo 09/04/2021 Transfusion 2 units packed red blood cells 09/04/2021    Antimicrobials:  None   Subjective: Patient sitting up in bed.  Denies any further bloody bowel movements.  States stool is becoming more formed.  No chest pain.  No shortness of breath.  Feels left lower extremity swelling is improving.  Stated ambulated in hallways around 1 AM this morning with no significant shortness of breath.   Objective: Vitals:   09/05/21 0537 09/05/21 1315 09/05/21 2023 09/06/21 0529  BP: 130/83 125/82 121/80 114/73  Pulse: 85 82 83 77  Resp: 18 18 16 18   Temp: 98.9 F (37.2 C) 98.7 F (37.1 C) 99 F (37.2 C) 97.8 F (36.6 C)  TempSrc:  Oral Oral Oral  SpO2: 97% 99% 97% 97%  Weight:      Height:        Intake/Output Summary (Last 24 hours) at 09/06/2021 1217 Last data filed at 09/06/2021 1000 Gross per 24 hour  Intake 796.22 ml  Output 1 ml  Net 795.22 ml    Filed Weights   09/03/21 1607  Weight: 79.8 kg    Examination:  General exam: : NAD Respiratory system: CTA B.  No wheezes, no rhonchi.  Speaking in full sentences.  Normal respiratory effort. Cardiovascular system: Regular rate and rhythm no murmurs rubs or gallops.  No JVD.  Trace to 1+ left lower extremity edema. Gastrointestinal system: Abdomen soft, nontender, nondistended, positive bowel sounds.  No rebound.  No guarding. Central nervous system: Alert and oriented. No focal neurological deficits. Extremities: Symmetric 5 x 5 power. Skin: No rashes, lesions or ulcers Psychiatry: Judgement and insight appear normal. Mood & affect appropriate.  Data Reviewed: I have personally reviewed following labs and imaging studies  CBC: Recent Labs  Lab 09/03/21 1719 09/04/21 1300 09/04/21 1453 09/04/21 2310 09/05/21 0313 09/05/21 1522 09/06/21 0436  WBC 11.2*  --   --   --   --   --  19.7*  NEUTROABS 8.2*  --   --   --   --   --   --   HGB 5.5*   < > 7.7* 7.4* 7.7*  7.7* 7.9*  HCT 20.0*   < > 25.9* 24.6* 26.4* 25.8* 26.6*  MCV 64.1*  --   --   --   --   --  70.6*  PLT 498*  --   --   --   --   --  433*   < > = values in this interval not displayed.     Basic Metabolic Panel: Recent Labs  Lab 09/03/21 1719 09/04/21 0520 09/05/21 0313 09/06/21 0436  NA 136 136 140 134*  K 4.5 3.9 4.2 3.6  CL 105 106 109 104  CO2 25 21* 25 24  GLUCOSE 129* 87 144* 144*  BUN 16 15 14 16   CREATININE 0.77 0.73 0.72 0.83  CALCIUM 8.1* 7.6* 8.6* 8.3*     GFR: Estimated Creatinine Clearance: 111.6 mL/min (by C-G formula based on SCr of 0.83 mg/dL).  Liver Function Tests: Recent Labs  Lab 09/03/21 1719  AST 15  ALT 17  ALKPHOS 75  BILITOT 0.4  PROT 7.1  ALBUMIN 3.0*  CBG: No results for input(s): GLUCAP in the last 168 hours.   Recent Results (from the past 240 hour(s))  Resp Panel by RT-PCR (Flu A&B, Covid) Nasopharyngeal Swab     Status: None   Collection Time: 09/03/21  8:24 PM   Specimen: Nasopharyngeal Swab; Nasopharyngeal(NP) swabs in vial transport medium  Result Value Ref Range Status   SARS Coronavirus 2 by RT PCR NEGATIVE NEGATIVE Final    Comment: (NOTE) SARS-CoV-2 target nucleic acids are NOT DETECTED.  The SARS-CoV-2 RNA is generally detectable in upper respiratory specimens during the acute phase of infection. The lowest concentration of SARS-CoV-2 viral copies this assay can detect is 138 copies/mL. A negative result does not preclude SARS-Cov-2 infection and should not be used as the sole basis for treatment or other patient management decisions. A negative result may occur with  improper specimen collection/handling, submission of specimen other than nasopharyngeal swab, presence of viral mutation(s) within the areas targeted by this assay, and inadequate number of viral copies(<138 copies/mL). A negative result must be combined with clinical observations, patient history, and epidemiological information. The expected  result is Negative.  Fact Sheet for Patients:  EntrepreneurPulse.com.au  Fact Sheet for Healthcare Providers:  IncredibleEmployment.be  This test is no t yet approved or cleared by the [place] FDA and  has been authorized for detection and/or diagnosis of SARS-CoV-2 by FDA under an Emergency Use Authorization (EUA). This EUA will remain  in effect (meaning this test can be used) for the duration of the COVID-19 declaration under Section 564(b)(1) of the Act, 21 U.S.C.section 360bbb-3(b)(1), unless the authorization is terminated  or revoked sooner.       Influenza A by PCR NEGATIVE NEGATIVE Final   Influenza B by PCR NEGATIVE NEGATIVE Final    Comment: (NOTE) The Xpert Xpress SARS-CoV-2/FLU/RSV plus assay is intended as an aid in the diagnosis of influenza from Nasopharyngeal swab specimens and should not be used as a sole basis for treatment. Nasal washings and aspirates are unacceptable for Xpert Xpress SARS-CoV-2/FLU/RSV testing.  Fact Sheet for Patients: EntrepreneurPulse.com.au  Fact Sheet for Healthcare Providers: IncredibleEmployment.be  This test is not yet approved or cleared by the [place] FDA and has been authorized for detection and/or diagnosis of SARS-CoV-2 by FDA under an Emergency Use Authorization (EUA). This EUA will remain in effect (meaning this test can be used) for the duration of the COVID-19 declaration under Section 564(b)(1) of the Act, 21 U.S.C. section 360bbb-3(b)(1), unless the authorization is terminated or revoked.  Performed at [facility], [address] [place], [place] [address]   C Difficile Quick Screen w PCR reflex     Status: None   Collection Time: 09/05/21 12:56 PM   Specimen: STOOL  Result Value Ref Range Status   C Diff antigen NEGATIVE NEGATIVE Final   C Diff toxin NEGATIVE NEGATIVE Final   C Diff interpretation No C. difficile  detected.  Final    Comment: Performed at [facility], [place] [address] [place], [place] [address]          Radiology Studies: DG Foot Complete Left  Result Date: 09/06/2021 Please see detailed radiograph report in office note.  ECHOCARDIOGRAM COMPLETE  Result Date: 09/04/2021    ECHOCARDIOGRAM REPORT   Patient Name:   HARCE VOLDEN Date of Exam: 09/04/2021 Medical Rec #:  [id number]         Height:       66.0 in Accession #:  [id number]        Weight:       176.0 lb Date of Birth:  1976/09/26         BSA:          1.894 m Patient Age:    73 years          BP:           121/73 mmHg Patient Gender: M                 HR:           86 bpm. Exam Location:  Inpatient Procedure: 2D Echo, Cardiac Doppler and Color Doppler Indications:    Pulmonary Embolus I26.09  History:        Patient has prior history of Echocardiogram examinations, most                 recent 02/07/2019.  Sonographer:    Bernadene Person RDCS Referring Phys: [place]  1. Prominent LV apical band. Left ventricular ejection fraction, by estimation, is 60 to 65%. The left ventricle has normal function. The left ventricle has no regional wall motion abnormalities. Left ventricular diastolic parameters were normal.  2. Right ventricular systolic function is normal. The right ventricular size is normal.  3. The mitral valve is normal in structure. Trivial mitral valve regurgitation. No evidence of mitral stenosis.  4. The aortic valve is normal in structure. Aortic valve regurgitation is not visualized. No aortic stenosis is present.  5. The inferior vena cava is normal in size with greater than 50% respiratory variability, suggesting right atrial pressure of 3 mmHg. FINDINGS  Left Ventricle: Prominent LV apical band. Left ventricular ejection fraction, by estimation, is 60 to 65%. The left ventricle has normal function. The left ventricle has no regional wall motion abnormalities. The left  ventricular internal cavity size was normal in size. There is no left ventricular hypertrophy. Left ventricular diastolic parameters were normal. Right Ventricle: The right ventricular size is normal. No increase in right ventricular wall thickness. Right ventricular systolic function is normal. Left Atrium: Left atrial size was normal in size. Right Atrium: Right atrial size was normal in size. Pericardium: There is no evidence of pericardial effusion. Mitral Valve: The mitral valve is normal in structure. Trivial mitral valve regurgitation. No evidence of mitral valve stenosis. Tricuspid Valve: The tricuspid valve is normal in structure. Tricuspid valve regurgitation is trivial. No evidence of tricuspid stenosis. Aortic Valve: The aortic valve is normal in structure. Aortic valve regurgitation is not visualized. No aortic stenosis is present. Pulmonic Valve: The pulmonic valve was normal in structure. Pulmonic valve regurgitation is not visualized. No evidence of pulmonic stenosis. Aorta: The aortic root is normal in size and structure. Venous: The inferior vena cava is normal in size with greater than 50% respiratory variability, suggesting right atrial pressure of 3 mmHg. IAS/Shunts: No atrial level shunt detected by color flow Doppler.  LEFT VENTRICLE PLAX 2D LVIDd:         5.00 cm   Diastology LVIDs:         3.40 cm   LV e' medial:    9.68 cm/s LV PW:         1.00 cm   LV E/e' medial:  8.8 LV IVS:        0.90 cm   LV e' lateral:   11.50 cm/s LVOT diam:     2.10 cm   LV E/e' lateral: 7.4  LV SV:         66 LV SV Index:   35 LVOT Area:     3.46 cm  RIGHT VENTRICLE RV S prime:     14.80 cm/s TAPSE (M-mode): 2.9 cm LEFT ATRIUM             Index        RIGHT ATRIUM           Index LA diam:        3.00 cm 1.58 cm/m   RA Area:     18.40 cm LA Vol (A2C):   50.5 ml 26.66 ml/m  RA Volume:   50.10 ml  26.45 ml/m LA Vol (A4C):   47.2 ml 24.92 ml/m LA Biplane Vol: 52.2 ml 27.56 ml/m  AORTIC VALVE LVOT Vmax:   108.00  cm/s LVOT Vmean:  72.300 cm/s LVOT VTI:    0.190 m  AORTA Ao Asc diam: 3.50 cm MITRAL VALVE MV Area (PHT): 4.80 cm    SHUNTS MV Decel Time: 158 msec    Systemic VTI:  0.19 m MV E velocity: 84.80 cm/s  Systemic Diam: 2.10 cm MV A velocity: 67.30 cm/s MV E/A ratio:  1.26 Jenkins Rouge MD Electronically signed by Jenkins Rouge MD Signature Date/Time: 09/04/2021/3:38:52 PM    Final         Scheduled Meds:  sodium chloride   Intravenous Once   apixaban  10 mg Oral BID   Followed by   Derrill Memo ON 09/10/2021] apixaban  5 mg Oral BID   loratadine  10 mg Oral Daily   pantoprazole  40 mg Oral Q0600   predniSONE  40 mg Oral Q breakfast   ursodiol  300 mg Oral BID   Continuous Infusions:     LOS: 3 days    Time spent: 35 minutes    Irine Seal, MD [facility]   To contact the attending provider between 7A-7P or the covering provider during after hours 7P-7A, please log into the web site [email or web address] and access using universal [place] password for that web site. If you do not have the password, please call the hospital operator.  09/06/2021, 12:17 PM

## 2021-09-06 NOTE — Plan of Care (Signed)
  Problem: Education: Goal: Knowledge of General Education information will improve Description: Including pain rating scale, medication(s)/side effects and non-pharmacologic comfort measures 09/06/2021 0934 by Tana Conch, RN Outcome: Progressing 09/05/2021 1939 by Tana Conch, RN Outcome: Progressing   Problem: Health Behavior/Discharge Planning: Goal: Ability to manage health-related needs will improve 09/06/2021 0934 by Tana Conch, RN Outcome: Progressing 09/05/2021 1939 by Tana Conch, RN Outcome: Progressing   Problem: Clinical Measurements: Goal: Ability to maintain clinical measurements within normal limits will improve Outcome: Progressing Goal: Diagnostic test results will improve Outcome: Progressing   Problem: Nutrition: Goal: Adequate nutrition will be maintained Outcome: Progressing   Problem: Pain Managment: Goal: General experience of comfort will improve Outcome: Progressing   Problem: Activity: Goal: Risk for activity intolerance will decrease Outcome: Adequate for Discharge

## 2021-09-06 NOTE — TOC Initial Note (Signed)
Transition of Care [facility]) - Initial/Assessment Note    Patient Details  Name: Reginald Parker MRN: [id number] Date of Birth: 1976-10-18  [facility]) CM/SW Contact:    Dessa Phi, RN Phone Number: 09/06/2021, 12:40 PM  Clinical Narrative:   See benefit check-$40-patient in agreement to cost-can afford.No further CM needs.                Expected Discharge Plan: Home/Self Care Barriers to Discharge: Continued Medical Work up   Patient Goals and CMS Choice Patient states their goals for this hospitalization and ongoing recovery are:: go home CMS Medicare.gov Compare Post Acute Care list provided to:: Patient    Expected Discharge Plan and Services Expected Discharge Plan: Home/Self Care   Discharge Planning Services: CM Consult   Living arrangements for the past 2 months: Single Family Home                                      Prior Living Arrangements/Services Living arrangements for the past 2 months: Single Family Home Lives with:: Self Patient language and need for interpreter reviewed:: Yes Do you feel safe going back to the place where you live?: Yes      Need for Family Participation in Patient Care: No (Comment) Care giver support system in place?: Yes (comment)   Criminal Activity/Legal Involvement Pertinent to Current Situation/Hospitalization: No - Comment as needed  Activities of Daily Living Home Assistive Devices/Equipment: Brace (specify type) (cam boot-left lower extremity) ADL Screening (condition at time of admission) Patient's cognitive ability adequate to safely complete daily activities?: Yes Is the patient deaf or have difficulty hearing?: No Does the patient have difficulty seeing, even when wearing glasses/contacts?: No Does the patient have difficulty concentrating, remembering, or making decisions?: No Patient able to express need for assistance with ADLs?: Yes Does the patient have difficulty dressing or bathing?:  No Independently performs ADLs?: Yes (appropriate for developmental age) Does the patient have difficulty walking or climbing stairs?: Yes (secondary to left leg swelling and pain) Weakness of Legs: Left Weakness of Arms/Hands: None  Permission Sought/Granted Permission sought to share information with : Case Manager Permission granted to share information with : Yes, Verbal Permission Granted  Share Information with NAME: Case Manager           Emotional Assessment Appearance:: Appears stated age Attitude/Demeanor/Rapport: Gracious Affect (typically observed): Accepting Orientation: : Oriented to Self, Oriented to Place, Oriented to  Time, Oriented to Situation Alcohol / Substance Use: Not Applicable Psych Involvement: No (comment)  Admission diagnosis:  GI bleeding [K92.2] Symptomatic anemia [D64.9] Patient Active Problem List   Diagnosis Date Noted   Ulcerative pancolitis ([place]) 09/04/2021   GI bleeding 09/03/2021   Symptomatic anemia 09/03/2021   Left leg DVT ([place]) 09/03/2021   Pulmonary emboli ([place]) 09/03/2021   Foot pain, left 08/27/2021   TB lung, latent 06/19/2021   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   PCP NOTES >>>>>>>>>>>>>>>>> 02/07/2016   Annual physical exam 02/06/2016   Osteoporosis 06/01/2015   Paronychia 06/01/2015   Primary sclerosing cholangitis    Crohn's disease ([place])    NASAL POLYP 07/30/2010   PCP:  Colon Branch, MD Pharmacy:   [id number] [place] [address] [place]Phone: [phone]: [phone]  [id number] [place], [address]: [phone]: [phone]  Social Determinants of Health (SDOH) Interventions    Readmission Risk Interventions No flowsheet data found.

## 2021-09-07 LAB — BASIC METABOLIC PANEL
Anion gap: 6 (ref 5–15)
BUN: 18 mg/dL (ref 6–20)
CO2: 24 mmol/L (ref 22–32)
Calcium: 8.2 mg/dL — ABNORMAL LOW (ref 8.9–10.3)
Chloride: 105 mmol/L (ref 98–111)
Creatinine, Ser: 0.75 mg/dL (ref 0.61–1.24)
GFR, Estimated: >60 mL/min (ref >60)
Glucose, Bld: 81 mg/dL (ref 70–99)
Potassium: 3.7 mmol/L (ref 3.5–5.1)
Sodium: 135 mmol/L (ref 135–145)

## 2021-09-07 LAB — CBC
HCT: 27.1 % — ABNORMAL LOW (ref 39.0–52.0)
Hemoglobin: 7.9 g/dL — ABNORMAL LOW (ref 13.0–17.0)
MCH: 21.4 pg — ABNORMAL LOW (ref 26.0–34.0)
MCHC: 29.2 g/dL — ABNORMAL LOW (ref 30.0–36.0)
MCV: 73.4 fL — ABNORMAL LOW (ref 80.0–100.0)
Platelets: 438 10*3/uL — ABNORMAL HIGH (ref 150–400)
RBC: 3.69 MIL/uL — ABNORMAL LOW (ref 4.22–5.81)
RDW: 27.9 % — ABNORMAL HIGH (ref 11.5–15.5)
WBC: 18 10*3/uL — ABNORMAL HIGH (ref 4.0–10.5)
nRBC: 10.9 % — ABNORMAL HIGH (ref 0.0–0.2)

## 2021-09-07 LAB — HEMOGLOBIN AND HEMATOCRIT, BLOOD
HCT: 30.9 % — ABNORMAL LOW (ref 39.0–52.0)
Hemoglobin: 8.8 g/dL — ABNORMAL LOW (ref 13.0–17.0)

## 2021-09-07 MED ORDER — POLYSACCHARIDE IRON COMPLEX 150 MG PO CAPS
150.0000 mg | ORAL_CAPSULE | Freq: Every day | ORAL | Status: DC
  Administered 2021-09-07 – 2021-09-08 (×2): 150 mg via ORAL
  Filled 2021-09-07 (×2): qty 1

## 2021-09-07 MED ORDER — METHYLPREDNISOLONE SODIUM SUCC 40 MG IJ SOLR
40.0000 mg | Freq: Once | INTRAMUSCULAR | Status: AC
  Administered 2021-09-07: 40 mg via INTRAVENOUS
  Filled 2021-09-07: qty 1

## 2021-09-07 MED ORDER — APIXABAN 5 MG PO TABS
5.0000 mg | ORAL_TABLET | Freq: Two times a day (BID) | ORAL | Status: DC
  Administered 2021-09-07 – 2021-09-08 (×3): 5 mg via ORAL
  Filled 2021-09-07 (×3): qty 1

## 2021-09-07 NOTE — Progress Notes (Signed)
[facility] Gastroenterology Progress Note  Frandy Basnett 45 y.o. 02/07/1976  CC: Ulcerative colitis, rectal bleeding   Subjective: GI was asked to reevaluate the patient because of episodes of rectal bleeding which started last night.  Patient was switched to oral prednisone recently.  He was having nonbloody bowel movement during the daytime yesterday but started seeing some blood in the stool from last night.  He denies any abdominal pain, nausea and vomiting.  ROS : Somewhat anxious.  Negative for nausea and vomiting.   Objective: Vital signs in last 24 hours: Vitals:   09/07/21 0455 09/07/21 0856  BP: 121/76 136/83  Pulse: 73 (!) 52  Resp: 20 16  Temp: 98.4 F (36.9 C) 98.2 F (36.8 C)  SpO2: 97% 99%    Physical Exam:  General:  Alert, cooperative, no distress, appears stated age  Head:  Normocephalic, without obvious abnormality, atraumatic  Eyes:  , EOM's intact,   Lungs:   Clear to auscultation bilaterally, respirations unlabored  Heart:  Regular rate and rhythm, S1, S2 normal  Abdomen:   Soft, non-tender, nondistended, bowel sounds present  Extremities: Extremities normal, atraumatic, no  edema  Pulses: 2+ and symmetric    Lab Results: Recent Labs    09/06/21 0436 09/07/21 0500  NA 134* 135  K 3.6 3.7  CL 104 105  CO2 24 24  GLUCOSE 144* 81  BUN 16 18  CREATININE 0.83 0.75  CALCIUM 8.3* 8.2*   No results for input(s): AST, ALT, ALKPHOS, BILITOT, PROT, ALBUMIN in the last 72 hours. Recent Labs    09/06/21 2126 09/07/21 0500  WBC 19.6* 18.0*  NEUTROABS 13.9*  --   HGB 8.2* 7.9*  HCT 28.0* 27.1*  MCV 72.7* 73.4*  PLT 472* 438*   No results for input(s): LABPROT, INR in the last 72 hours.    Assessment/Plan: -Ulcerative pancolitis with rectal bleeding -Symptomatic anemia.  Hemoglobin stable -DVT/PE -started on Eliquis September 06, 2021.  Was on heparin prior to that.   Recommendations ----------------------- -Patient was started on Eliquis  loading dose of 10 mg twice daily yesterday and was supposed to start Eliquis 5 mg twice a day from October 18.  -Recommend to reduce Eliquis to 5 mg twice a day -I will add additional dose of Solu-Medrol 40 mg at nighttime today -Probably will change prednisone taper to 40 mg in the morning and 20 mg at nighttime for 1 week followed by 40 mg once a day for 4-week and then 10 mg taper every week.  -Recommend observation overnight.  Discussed with hospitalist. -GI will follow  Otis Brace MD, [place] 09/07/2021, 9:51 AM  Contact #  [phone]

## 2021-09-07 NOTE — Progress Notes (Signed)
PROGRESS NOTE    Reginald Parker  GDJ:[id number] DOB: 10/24/1976 DOA: 09/03/2021 PCP: Colon Branch, MD   Chief Complaint  Patient presents with   DVT left LE    Brief Narrative:  Patient 45 year old gentleman with medical history significant for Crohn's disease, successfully treated latent TB presented to the ED for left foot swelling diagnosed with a DVT  with noted swelling in bilateral foot and ankle after treatment the UV shielding July.  Patient seen by podiatry after recurrent left-sided swelling and pain x10 days, ultrasound obtained concerning for DVT in the gastrocnemius and posterior tibial DVT.  Patient also with complaints of bloody diarrhea after prednisone taper decreased to 5 mg with improvement with bloody diarrhea after prednisone increased back to 20 mg with some complaints of exertional shortness of breath.  CT angiogram chest done negative for large central PE or right heart strain but suspicious for small subsegmental PE at bilateral bases.  Patient also noted on presentation to have a hemoglobin of 5.5 and transfused 2 units packed red blood cells early this morning.  GI consulted and following.   Assessment & Plan:   Principal Problem:   Symptomatic anemia Active Problems:   Crohn's disease (HCC)   GI bleeding   Left leg DVT (HCC)   Pulmonary emboli (HCC)   Ulcerative pancolitis (HCC)   1 symptomatic anemia, lower GI bleed/ulcerative pancolitis -Patient presented with symptomatic lower GI bleed, history of ulcerative pancolitis and noted to have a left calf DVT on outpatient ultrasound.  Patient states had a bloody bowel movement last night and brown stool this morning.  -Hemoglobin on presentation 5.5.  Status post transfusion 2 units packed red blood cells hemoglobin at 7.9 this morning. -Patient noted to have some diarrhea with bleeding then resolved about 4 days prior to admission however FOBT was positive in the ED and patient did have a bloody bowel the  morning of 09/04/2021 in the evening of 09/04/2021.  Bloody bowel movements were initially improving and had no bloody bowel movements throughout the day yesterday however last night and early this morning noted to have increasing bloody bowel movements per patient. -Status post IV iron. -Patient placed on IV methylprednisone and transitioned to oral prednisone per GI. -C. difficile PCR negative. -Per GI patient likely needs oral iron supplementation on discharge and will need to continue prednisone taper until he has been started on biologic agent. -Patient reassessed by GI this morning and an additional dose of Solu-Medrol 40 mg ordered at nighttime today and recommending Eliquis to be decreased to 5 mg twice daily and recommending prednisone taper to 40 mg in the morning and 20 mg at night x1 week followed by 40 mg daily x4 weeks then 10 mg taper weekly. -Supportive care. -Per GI.  2.  Acute left lower extremity DVT/PE -Patient increased risk for DVT PE with recent COVID in March 2022, flare of ulcerative colitis since that time. -Patient started on IV methylprednisone for ulcerative pancolitis and cleared by GI for anticoagulation at this time. -Placed on heparin per pharmacy and H&H stable this morning posttransfusion currently at 7.9.   -Patient stated had some bloody bowel movements overnight, which he feels might be from his ulcerative colitis. -2D echo with normal EF, no right ventricular strain noted.  Right lower extremity Dopplers negative for DVT -Hemoglobin seems to be stabilizing and some was on heparin and transition to Eliquis yesterday.  -As this is first DVT/PE will likely need to be on anticoagulation anywhere from  3 to 6 months. -Due to bleeding noted last night and this morning we will repeat H&H this afternoon. -Case discussed with GI who recommended decreasing dose of Eliquis to 5 twice daily and patient given IV dose of methylprednisone this evening. -After completion of  anticoagulation could likely undergo hypercoagulable work-up in the outpatient setting. -Outpatient follow-up.  3.  Leukocytosis -Likely steroid-induced. -Outpatient follow-up.     DVT prophylaxis: Heparin>>>>> Eliquis Code Status: Full Family Communication: Updated patient.  No family at bedside.  Disposition:   Status is: Inpatient  Remains inpatient appropriate because:Inpatient level of care appropriate due to severity of illness  Dispo: The patient is from: Home              Anticipated d/c is to: Home              Patient currently is not medically stable to d/c.   Difficult to place patient No       Consultants:  Gastroenterology: Dr.Magod 09/04/2021  Procedures:  Lower extremity Dopplers 09/04/2021 2D echo 09/04/2021 Transfusion 2 units packed red blood cells 09/04/2021    Antimicrobials:  None   Subjective: Sitting up on day bed.  Denies any chest pain.  No shortness of breath.  No abdominal pain.  Stated yesterday during the day had clear nonbloody stools however around 11:30 PM last night and around 5 AM this morning started to have increasing bloody stools.  Patient states he asked the nurse to call the doctor as he felt he needed more prednisone as he felt bleeding was due to his ulcerative colitis flare..    Objective: Vitals:   09/06/21 1238 09/06/21 2030 09/07/21 0455 09/07/21 0856  BP: 118/75 119/81 121/76 136/83  Pulse: 88 75 73 (!) 52  Resp: 16 19 20 16   Temp: 98.1 F (36.7 C) 98.5 F (36.9 C) 98.4 F (36.9 C) 98.2 F (36.8 C)  TempSrc: Oral  Oral Oral  SpO2: 100% 99% 97% 99%  Weight:      Height:        Intake/Output Summary (Last 24 hours) at 09/07/2021 1209 Last data filed at 09/06/2021 1901 Gross per 24 hour  Intake 480 ml  Output --  Net 480 ml    Filed Weights   09/03/21 1607  Weight: 79.8 kg    Examination:  General exam: : NAD Respiratory system: CTAB.  No wheezes, no rhonchi.  Speaking in full sentences.  Normal  respiratory effort. Cardiovascular system: Regular rate and rhythm no murmurs rubs or gallops.  No JVD.  No lower extremity edema.  Gastrointestinal system: Abdomen soft, nontender, nondistended, positive bowel sounds.  No rebound.  No guarding. Central nervous system: Alert and oriented. No focal neurological deficits. Extremities: Symmetric 5 x 5 power. Skin: No rashes, lesions or ulcers Psychiatry: Judgement and insight appear normal. Mood & affect appropriate.  Data Reviewed: I have personally reviewed following labs and imaging studies  CBC: Recent Labs  Lab 09/03/21 1719 09/04/21 1300 09/05/21 0313 09/05/21 1522 09/06/21 0436 09/06/21 2126 09/07/21 0500  WBC 11.2*  --   --   --  19.7* 19.6* 18.0*  NEUTROABS 8.2*  --   --   --   --  13.9*  --   HGB 5.5*   < > 7.7* 7.7* 7.9* 8.2* 7.9*  HCT 20.0*   < > 26.4* 25.8* 26.6* 28.0* 27.1*  MCV 64.1*  --   --   --  70.6* 72.7* 73.4*  PLT 498*  --   --   --  433* 472* 438*   < > = values in this interval not displayed.     Basic Metabolic Panel: Recent Labs  Lab 09/03/21 1719 09/04/21 0520 09/05/21 0313 09/06/21 0436 09/07/21 0500  NA 136 136 140 134* 135  K 4.5 3.9 4.2 3.6 3.7  CL 105 106 109 104 105  CO2 25 21* 25 24 24   GLUCOSE 129* 87 144* 144* 81  BUN 16 15 14 16 18   CREATININE 0.77 0.73 0.72 0.83 0.75  CALCIUM 8.1* 7.6* 8.6* 8.3* 8.2*     GFR: Estimated Creatinine Clearance: 115.8 mL/min (by C-G formula based on SCr of 0.75 mg/dL).  Liver Function Tests: Recent Labs  Lab 09/03/21 1719  AST 15  ALT 17  ALKPHOS 75  BILITOT 0.4  PROT 7.1  ALBUMIN 3.0*     CBG: No results for input(s): GLUCAP in the last 168 hours.   Recent Results (from the past 240 hour(s))  Resp Panel by RT-PCR (Flu A&B, Covid) Nasopharyngeal Swab     Status: None   Collection Time: 09/03/21  8:24 PM   Specimen: Nasopharyngeal Swab; Nasopharyngeal(NP) swabs in vial transport medium  Result Value Ref Range Status   SARS  Coronavirus 2 by RT PCR NEGATIVE NEGATIVE Final    Comment: (NOTE) SARS-CoV-2 target nucleic acids are NOT DETECTED.  The SARS-CoV-2 RNA is generally detectable in upper respiratory specimens during the acute phase of infection. The lowest concentration of SARS-CoV-2 viral copies this assay can detect is 138 copies/mL. A negative result does not preclude SARS-Cov-2 infection and should not be used as the sole basis for treatment or other patient management decisions. A negative result may occur with  improper specimen collection/handling, submission of specimen other than nasopharyngeal swab, presence of viral mutation(s) within the areas targeted by this assay, and inadequate number of viral copies(<138 copies/mL). A negative result must be combined with clinical observations, patient history, and epidemiological information. The expected result is Negative.  Fact Sheet for Patients:  EntrepreneurPulse.com.au  Fact Sheet for Healthcare Providers:  IncredibleEmployment.be  This test is no t yet approved or cleared by the [place] FDA and  has been authorized for detection and/or diagnosis of SARS-CoV-2 by FDA under an Emergency Use Authorization (EUA). This EUA will remain  in effect (meaning this test can be used) for the duration of the COVID-19 declaration under Section 564(b)(1) of the Act, 21 U.S.C.section 360bbb-3(b)(1), unless the authorization is terminated  or revoked sooner.       Influenza A by PCR NEGATIVE NEGATIVE Final   Influenza B by PCR NEGATIVE NEGATIVE Final    Comment: (NOTE) The Xpert Xpress SARS-CoV-2/FLU/RSV plus assay is intended as an aid in the diagnosis of influenza from Nasopharyngeal swab specimens and should not be used as a sole basis for treatment. Nasal washings and aspirates are unacceptable for Xpert Xpress SARS-CoV-2/FLU/RSV testing.  Fact Sheet for  Patients: EntrepreneurPulse.com.au  Fact Sheet for Healthcare Providers: IncredibleEmployment.be  This test is not yet approved or cleared by the [place] FDA and has been authorized for detection and/or diagnosis of SARS-CoV-2 by FDA under an Emergency Use Authorization (EUA). This EUA will remain in effect (meaning this test can be used) for the duration of the COVID-19 declaration under Section 564(b)(1) of the Act, 21 U.S.C. section 360bbb-3(b)(1), unless the authorization is terminated or revoked.  Performed at [facility], [address] [place], [place] [address]   C Difficile Quick Screen w PCR reflex  Status: None   Collection Time: 09/05/21 12:56 PM   Specimen: STOOL  Result Value Ref Range Status   C Diff antigen NEGATIVE NEGATIVE Final   C Diff toxin NEGATIVE NEGATIVE Final   C Diff interpretation No C. difficile detected.  Final    Comment: Performed at [facility], [place] [address] [place], [place] [address]          Radiology Studies: DG Foot Complete Left  Result Date: 09/06/2021 Please see detailed radiograph report in office note.       Scheduled Meds:  sodium chloride   Intravenous Once   apixaban  5 mg Oral BID   iron polysaccharides  150 mg Oral Daily   loratadine  10 mg Oral Daily   methylPREDNISolone (SOLU-MEDROL) injection  40 mg Intravenous Once   pantoprazole  40 mg Oral Q0600   predniSONE  40 mg Oral Q breakfast   ursodiol  300 mg Oral BID   Continuous Infusions:     LOS: 4 days    Time spent: 35 minutes    Irine Seal, MD [facility]   To contact the attending provider between 7A-7P or the covering provider during after hours 7P-7A, please log into the web site [email or web address] and access using universal [place] password for that web site. If you do not have the password, please call the hospital operator.  09/07/2021, 12:09 PM

## 2021-09-08 DIAGNOSIS — K922 Gastrointestinal hemorrhage, unspecified: Secondary | ICD-10-CM

## 2021-09-08 DIAGNOSIS — I493 Ventricular premature depolarization: Secondary | ICD-10-CM | POA: Diagnosis present

## 2021-09-08 LAB — CBC
HCT: 29.4 % — ABNORMAL LOW (ref 39.0–52.0)
Hemoglobin: 8.5 g/dL — ABNORMAL LOW (ref 13.0–17.0)
MCH: 22 pg — ABNORMAL LOW (ref 26.0–34.0)
MCHC: 28.9 g/dL — ABNORMAL LOW (ref 30.0–36.0)
MCV: 76.2 fL — ABNORMAL LOW (ref 80.0–100.0)
Platelets: 420 K/uL — ABNORMAL HIGH (ref 150–400)
RBC: 3.86 MIL/uL — ABNORMAL LOW (ref 4.22–5.81)
RDW: 30.5 % — ABNORMAL HIGH (ref 11.5–15.5)
WBC: 18.2 K/uL — ABNORMAL HIGH (ref 4.0–10.5)
nRBC: 4.8 % — ABNORMAL HIGH (ref 0.0–0.2)

## 2021-09-08 LAB — BASIC METABOLIC PANEL WITH GFR
Anion gap: 5 (ref 5–15)
BUN: 19 mg/dL (ref 6–20)
CO2: 25 mmol/L (ref 22–32)
Calcium: 8.2 mg/dL — ABNORMAL LOW (ref 8.9–10.3)
Chloride: 104 mmol/L (ref 98–111)
Creatinine, Ser: 0.72 mg/dL (ref 0.61–1.24)
GFR, Estimated: >60 mL/min
Glucose, Bld: 107 mg/dL — ABNORMAL HIGH (ref 70–99)
Potassium: 4.1 mmol/L (ref 3.5–5.1)
Sodium: 134 mmol/L — ABNORMAL LOW (ref 135–145)

## 2021-09-08 LAB — MAGNESIUM: Magnesium: 2.7 mg/dL — ABNORMAL HIGH (ref 1.7–2.4)

## 2021-09-08 MED ORDER — POLYSACCHARIDE IRON COMPLEX 150 MG PO CAPS: 150.0000 mg | ORAL_CAPSULE | Freq: Every day | ORAL | 1 refills | Status: DC

## 2021-09-08 MED ORDER — CARVEDILOL 3.125 MG PO TABS
3.1250 mg | ORAL_TABLET | Freq: Two times a day (BID) | ORAL | Status: DC
  Administered 2021-09-08 (×2): 3.125 mg via ORAL
  Filled 2021-09-08: qty 1

## 2021-09-08 MED ORDER — PREDNISONE 10 MG PO TABS: 20.0000 mg | ORAL_TABLET | Freq: Every day | ORAL | 0 refills | Status: DC

## 2021-09-08 MED ORDER — PANTOPRAZOLE SODIUM 40 MG PO TBEC: 40.0000 mg | DELAYED_RELEASE_TABLET | Freq: Every day | ORAL | 1 refills | Status: DC

## 2021-09-08 MED ORDER — APIXABAN 5 MG PO TABS: 5.0000 mg | ORAL_TABLET | Freq: Two times a day (BID) | ORAL | 2 refills | Status: DC

## 2021-09-08 MED ORDER — CARVEDILOL 3.125 MG PO TABS: 3.1250 mg | ORAL_TABLET | Freq: Two times a day (BID) | ORAL | 0 refills | Status: DC

## 2021-09-08 MED ORDER — ONDANSETRON HCL 4 MG PO TABS: 4.0000 mg | ORAL_TABLET | Freq: Four times a day (QID) | ORAL | 0 refills | Status: DC | PRN

## 2021-09-08 NOTE — Discharge Summary (Signed)
Physician Discharge Summary  Elliott Lasecki LKG:[id number] DOB: [birth year] DOA: 09/03/2021  PCP: Colon Branch, MD  Admit date: 09/03/2021 Discharge date: 09/08/2021  Time spent: 60 minutes  Recommendations for Outpatient Follow-up:  Follow-up with Dr. Michail Sermon, GI on 10/02/2021 at 11:30 AM.  On follow-up further recommendations for ulcerative colitis will be done at that time as a further management of patient's steroids which she was discharged on.  On follow-up patient will need a CBC done to follow-up on H&H. Follow-up with Colon Branch, MD in 1 to 2 weeks.  On follow-up patient will need a CBC done to follow-up on H&H.  Patient will need a basic metabolic profile done to follow-up on electrolytes and renal function.  Patient's acute PE and DVT will need to be followed up upon and further determination on duration of anticoagulation will need to be made at that time.  Further consideration for hypercoagulable work-up may be done once patient has completed anticoagulation. Follow-up with Dr. Geraldo Pitter, cardiology in 1 week for follow-up on frequent PVCs and further recommendations as to whether to continue beta-blocker which patient was started on on discharge during this hospitalization.   Discharge Diagnoses:  Principal Problem:   Symptomatic anemia Active Problems:   Frequent PVCs   GI bleeding   Left leg DVT (HCC)   Pulmonary emboli (HCC)   Ulcerative pancolitis (HCC)   Asymptomatic PVCs   Discharge Condition: Stable and improved.  Diet recommendation: Heart healthy  Filed Weights   09/03/21 1607  Weight: 79.8 kg    History of present illness:  HPI per Dr. Terrial Rhodes is a pleasant 45 y.o. male with medical history significant for Crohn's disease and successfully treated latent TB, now presenting to the emergency department for evaluation of left foot swelling with DVT diagnosed on outpatient ultrasound.  Patient reported that he developed bilateral foot and  ankle swelling shortly after treatment with Evusheld in July, had resolution of the left sided swelling after a couple weeks, has had persistent mild right foot and ankle swelling, but then went on to develop recurrent swelling on the left side with severe sharp left foot pain for the past 10 days.  He was evaluated for this complaint by podiatry today, ultrasound was obtained in light of the swelling, and he was found to have gastrocnemius and posterior tibial DVT.  Patient also reports that he had been on a long prednisone taper and developed bloody diarrhea when he got down to 5 mg, continued to have bloody diarrhea for 1 to 2 weeks, but after resuming 20 mg of prednisone daily, diarrhea and bleeding appeared to have resolved as of 08/31/2021.  He has had some mild exertional dyspnea recently but denies any cough, hemoptysis, or chest pain.   [facility] ED Course: Upon arrival to the ED, patient is found to be afebrile, saturating well on room air, slightly tachycardic, and with stable blood pressure.  EKG features sinus rhythm.  CTA chest is negative for large central PE or right heart strain but suspicious for small subsegmental PE at the bilateral bases.  Blood work notable for hemoglobin 5.5 with microcytosis, mild leukocytosis, and mild thrombocytosis.  Troponin and BNP were normal.  Patient was transferred to [facility] for admission.  Hospital Course:  1 symptomatic anemia, lower GI bleed/ulcerative pancolitis with rectal bleeding -Patient presented with symptomatic lower GI bleed, history of ulcerative pancolitis and noted to have a left calf DVT on outpatient ultrasound.  Patient initially improved with  bloody bowel movements however the evening of 09/06/2021 and early morning of 09/07/2021 patient noted to have some bloody bowel movements which resolved with increased steroids.  -Hemoglobin on presentation 5.5.  Status post transfusion 2 units packed red blood cells hemoglobin stabilizing at  8.5 by day of discharge. -Patient noted to have some diarrhea with bleeding then resolved about 4 days prior to admission however FOBT was positive in the ED and patient did have a bloody bowel the morning of 09/04/2021 in the evening of 09/04/2021.  Bloody bowel movements were initially improving and had no bloody bowel movements throughout the day the evening of 09/06/2021 in the early morning of 09/07/2021 patient noted to have increasing bloody bowel movements which resolved with increase steroids per GI recommendation. -Status post IV iron. -Patient placed on IV methylprednisone and transitioned to oral prednisone per GI. -C. difficile PCR negative. -Per GI patient likely needs oral iron supplementation on discharge and will need to continue prednisone taper until he has been started on biologic agent. -Patient reassessed by GI the morning of 09/07/2021 due to further rectal bleeding, and an additional dose of Solu-Medrol 40 mg ordered at nighttime and recommended Eliquis to be decreased to 5 mg twice daily and recommending prednisone taper to 40 mg in the morning and 20 mg at night x1 week followed by 40 mg daily x4 weeks then 10 mg taper weekly. -Patient cleared by GI for discharge as bloody bowel movement had resolved on day of discharge and patient was asymptomatic with clinical improvement. -Outpatient follow-up with GI.  2.  Acute left lower extremity DVT/PE -Patient increased risk for DVT PE with recent COVID in March 2022, flare of ulcerative colitis since that time. -Patient started on IV methylprednisone for ulcerative pancolitis and cleared by GI for anticoagulation on admission. -Patient initially placed on heparin per pharmacy and H&H stable posttransfusion such that by day of discharge hemoglobin was 8.5.   -Patient bloody bowel movements improved initially during the hospitalization, patient had a bout of bloody bowel movement which improved/resolved with increased steroids per GI  recommendations as it was felt rectal bleeding was secondary to his ulcerative colitis.  -2D echo with normal EF, no right ventricular strain noted.  Right lower extremity Dopplers negative for DVT -Hemoglobin stabilized and patient subsequently transition from IV heparin to Eliquis. -As this is first DVT/PE will likely need to be on anticoagulation anywhere from 3 to 6 months. -Patient initially on transitioning to Eliquis was started on 10 mg twice daily however due to rectal bleeding which resolved with increased steroids recommendations will GI was to decrease the dose to Eliquis 5 mg twice daily which patient tolerated. -After completion of anticoagulation could likely undergo hypercoagulable work-up in the outpatient setting. -Outpatient follow-up.  3.  Leukocytosis -Likely steroid-induced. -Patient remained afebrile.  No signs or symptoms of infection. -Outpatient follow-up.  4.  Asymptomatic PVCs/history of frequent PVCs -Patient noted to have PVCs during the hospitalization however remained asymptomatic. -Per Care Everywhere patient has had a history of frequent PVCs and has been worked up by cardiology in the outpatient setting. -Patient does state he was also seen by electrophysiology and as he was asymptomatic was recommended monitoring. -Electrolytes were checked and patient noted to have a potassium of 4.1, magnesium of 2.7.  Patient's hemoglobin had remained stable. -2D echo done with a EF of 60 to 65%,NWMA. -Patient placed on low-dose Coreg 3.125 mg twice daily. -We will need outpatient follow-up with cardiology for further evaluation and  management and to determine whether patient needs continued beta-blockade.    Procedures: Lower extremity Dopplers 09/04/2021 2D echo 09/04/2021 Transfusion 2 units packed red blood cells 09/04/2021   Consultations: Gastroenterology: Dr.Magod 09/04/2021    Discharge Exam: Vitals:   09/08/21 0904 09/08/21 1311  BP: 123/81 117/79   Pulse: (!) 107 78  Resp: 14 17  Temp: 98.1 F (36.7 C) 98.6 F (37 C)  SpO2: 99% 98%    General: NAD Cardiovascular: RRR no murmurs rubs or gallops.  No JVD.  Trace left lower extremity edema. Respiratory: Lungs clear to auscultation bilaterally.  No wheezes, no crackles, no rhonchi.  Fair air movement.  Discharge Instructions   Discharge Instructions     Diet - low sodium heart healthy   Complete by: As directed    Increase activity slowly   Complete by: As directed    Schedule Transitional Care Management Visit   Complete by: As directed    Specify: Follow up [facility]   Follow up with Dr. Michail Sermon at [facility] 10/02/21 at 11:30, please arrive at 11:15.      Allergies as of 09/08/2021       Reactions   Ibuprofen Other (See Comments)   Causes Heartburn        Medication List     STOP taking these medications    ALIGN PO   azelastine 0.1 % nasal spray Commonly known as: ASTELIN   [employer] Gentle Laxative 5 MG EC tablet Generic drug: bisacodyl   diclofenac 75 MG EC tablet Commonly known as: VOLTAREN   hydrocortisone 100 MG/60ML enema Commonly known as: CORTENEMA   Lialda 1.2 g EC tablet Generic drug: mesalamine   polyethylene glycol-electrolytes 420 g solution Commonly known as: NuLYTELY       TAKE these medications    albuterol 108 (90 Base) MCG/ACT inhaler Commonly known as: Ventolin HFA Inhale 2 puffs into the lungs every 6 (six) hours as needed for wheezing or shortness of breath.   apixaban 5 MG Tabs tablet Commonly known as: ELIQUIS Take 1 tablet (5 mg total) by mouth 2 (two) times daily.   carvedilol 3.125 MG tablet Commonly known as: COREG Take 1 tablet (3.125 mg total) by mouth 2 (two) times daily with a meal.   cetirizine 10 MG tablet Commonly known as: ZYRTEC Take 10 mg by mouth daily.   iron polysaccharides 150 MG capsule Commonly known as: NIFEREX Take 1 capsule (150 mg total) by mouth  daily. Start taking on: September 09, 2021   [facility]  Antifungal- (nail)-#1  Dr Lawernce Pitts   ondansetron 4 MG tablet Commonly known as: ZOFRAN Take 1 tablet (4 mg total) by mouth every 6 (six) hours as needed for nausea.   pantoprazole 40 MG tablet Commonly known as: PROTONIX Take 1 tablet (40 mg total) by mouth daily at 6 (six) AM. Start taking on: September 09, 2021   predniSONE 10 MG tablet Commonly known as: DELTASONE Take 2-4 tablets (20-40 mg total) by mouth daily. Take prednisone 40 mg (4 tablets) in the morning and then prednisone 20 mg (2 tablets) in the evening x1 week, then take prednisone 40 mg (4 tablets) daily x4 weeks, then take prednisone 30 mg (3 tablets) daily until seen by GI. What changed:  how much to take additional instructions   ursodiol 500 MG tablet Commonly known as: ACTIGALL Take 500 mg by mouth 2 (two) times daily.       Allergies  Allergen Reactions  Ibuprofen Other (See Comments)    Causes Heartburn    Follow-up Information     Wilford Corner, MD .   Specialty: Gastroenterology Contact information: [address] [place] [address] [phone]         Colon Branch, MD. Schedule an appointment as soon as possible for a visit in 1 week(s).   Specialty: Internal Medicine Why: f/u in 1-2 weeks. Contact information: [address] [place] [address] [phone]         Revankar, Reita Cliche, MD. Schedule an appointment as soon as possible for a visit in 1 week(s).   Specialty: Cardiology Contact information: [place] [address] [phone]                  The results of significant diagnostics from this hospitalization (including imaging, microbiology, ancillary and laboratory) are listed below for reference.    Significant Diagnostic Studies: CT Angio Chest PE W and/or Wo Contrast  Result Date:  09/03/2021 CLINICAL DATA:  Pulmonary embolus suspected with high probability. Left leg swelling. Positive ultrasound for DVT. Shortness of breath for 2 weeks. EXAM: CT ANGIOGRAPHY CHEST WITH CONTRAST TECHNIQUE: Multidetector CT imaging of the chest was performed using the standard protocol during bolus administration of intravenous contrast. Multiplanar CT image reconstructions and MIPs were obtained to evaluate the vascular anatomy. CONTRAST:  159m OMNIPAQUE IOHEXOL 350 MG/ML SOLN COMPARISON:  06/15/2010 FINDINGS: Cardiovascular: The contrast bolus is somewhat limited but there is moderately good visualization of the pulmonary arteries to the segmental level. No large central filling defects are demonstrated. There is a filling defect demonstrated in a right lower lung subsegmental pulmonary artery and probably in a couple of peripheral left lower lobe pulmonary arteries. This likely indicates peripheral emboli. No evidence of right heart strain. RV to LV ratio is normal at 0.8. Normal heart size. No pericardial effusions. Normal caliber thoracic aorta. No aortic dissection. Great vessel origins are patent. Mediastinum/Nodes: Thyroid gland is unremarkable. Esophagus is decompressed. No significant lymphadenopathy. Lungs/Pleura: Mild dependent changes in the lung bases. Lungs are otherwise clear and expanded. No pleural effusions. No pneumothorax. Airways are patent. Upper Abdomen: Cholelithiasis with several stones in the gallbladder. No inflammatory changes or wall thickening. Scattered lymph nodes are not pathologically enlarged, likely reactive. Musculoskeletal: No chest wall abnormality. No acute or significant osseous findings. Review of the MIP images confirms the above findings. IMPRESSION: 1. Contrast bolus to the pulmonary arteries is somewhat limited but there appear to be small subsegmental pulmonary emboli in both lung bases. No large central pulmonary embolus. No evidence of right heart strain. 2.  Mild dependent atelectasis in the lung bases. Lungs are otherwise clear. 3. Cholelithiasis. Critical Value/emergent results were called by telephone at the time of interpretation on 09/03/2021 at 7:29 pm to provider Dr. DLester Kinsman who verbally acknowledged these results. Electronically Signed   By: WLucienne CapersM.D.   On: 09/03/2021 19:35   [place]Venous Img Lower Unilateral Left (DVT)  Result Date: 09/03/2021 CLINICAL DATA:  Pain and swelling EXAM: LEFT LOWER EXTREMITY VENOUS DOPPLER ULTRASOUND TECHNIQUE: Gray-scale sonography with compression, as well as color and duplex ultrasound, were performed to evaluate the deep venous system(s) from the level of the common femoral vein through the popliteal and proximal calf veins. COMPARISON:  None. FINDINGS: VENOUS The gastrocnemius vein and posterior tibial veins are noncompressible containing hypoechoic thrombus. Peroneal vein normal. Normal compressibility of the common  femoral, superficial femoral, and popliteal veins . Visualized portions of profunda femoral vein and great saphenous vein unremarkable. Limited views of the contralateral common femoral vein are unremarkable. OTHER None. Limitations: none IMPRESSION: 1. POSITIVE for gastrocnemius and posterior tibial (calf) DVT without propagation above the knee. Electronically Signed   By: Lucrezia Europe M.D.   On: 09/03/2021 15:21   DG Foot Complete Left  Result Date: 09/06/2021 Please see detailed radiograph report in office note.  ECHOCARDIOGRAM COMPLETE  Result Date: 09/04/2021    ECHOCARDIOGRAM REPORT   Patient Name:   Reginald Parker Date of Exam: 09/04/2021 Medical Rec #:  [id number]         Height:       66.0 in Accession #:    [id number]        Weight:       176.0 lb Date of Birth:  1975/12/09         BSA:          1.894 m Patient Age:    45 years          BP:           121/73 mmHg Patient Gender: M                 HR:           86 bpm. Exam Location:  Inpatient Procedure: 2D Echo, Cardiac Doppler and  Color Doppler Indications:    Pulmonary Embolus I26.09  History:        Patient has prior history of Echocardiogram examinations, most                 recent 02/07/2019.  Sonographer:    Bernadene Person RDCS Referring Phys: [place]  1. Prominent LV apical band. Left ventricular ejection fraction, by estimation, is 60 to 65%. The left ventricle has normal function. The left ventricle has no regional wall motion abnormalities. Left ventricular diastolic parameters were normal.  2. Right ventricular systolic function is normal. The right ventricular size is normal.  3. The mitral valve is normal in structure. Trivial mitral valve regurgitation. No evidence of mitral stenosis.  4. The aortic valve is normal in structure. Aortic valve regurgitation is not visualized. No aortic stenosis is present.  5. The inferior vena cava is normal in size with greater than 50% respiratory variability, suggesting right atrial pressure of 3 mmHg. FINDINGS  Left Ventricle: Prominent LV apical band. Left ventricular ejection fraction, by estimation, is 60 to 65%. The left ventricle has normal function. The left ventricle has no regional wall motion abnormalities. The left ventricular internal cavity size was normal in size. There is no left ventricular hypertrophy. Left ventricular diastolic parameters were normal. Right Ventricle: The right ventricular size is normal. No increase in right ventricular wall thickness. Right ventricular systolic function is normal. Left Atrium: Left atrial size was normal in size. Right Atrium: Right atrial size was normal in size. Pericardium: There is no evidence of pericardial effusion. Mitral Valve: The mitral valve is normal in structure. Trivial mitral valve regurgitation. No evidence of mitral valve stenosis. Tricuspid Valve: The tricuspid valve is normal in structure. Tricuspid valve regurgitation is trivial. No evidence of tricuspid stenosis. Aortic Valve: The aortic valve  is normal in structure. Aortic valve regurgitation is not visualized. No aortic stenosis is present. Pulmonic Valve: The pulmonic valve was normal in structure. Pulmonic valve regurgitation is not visualized. No evidence of pulmonic stenosis. Aorta: The aortic root  is normal in size and structure. Venous: The inferior vena cava is normal in size with greater than 50% respiratory variability, suggesting right atrial pressure of 3 mmHg. IAS/Shunts: No atrial level shunt detected by color flow Doppler.  LEFT VENTRICLE PLAX 2D LVIDd:         5.00 cm   Diastology LVIDs:         3.40 cm   LV e' medial:    9.68 cm/s LV PW:         1.00 cm   LV E/e' medial:  8.8 LV IVS:        0.90 cm   LV e' lateral:   11.50 cm/s LVOT diam:     2.10 cm   LV E/e' lateral: 7.4 LV SV:         66 LV SV Index:   35 LVOT Area:     3.46 cm  RIGHT VENTRICLE RV S prime:     14.80 cm/s TAPSE (M-mode): 2.9 cm LEFT ATRIUM             Index        RIGHT ATRIUM           Index LA diam:        3.00 cm 1.58 cm/m   RA Area:     18.40 cm LA Vol (A2C):   50.5 ml 26.66 ml/m  RA Volume:   50.10 ml  26.45 ml/m LA Vol (A4C):   47.2 ml 24.92 ml/m LA Biplane Vol: 52.2 ml 27.56 ml/m  AORTIC VALVE LVOT Vmax:   108.00 cm/s LVOT Vmean:  72.300 cm/s LVOT VTI:    0.190 m  AORTA Ao Asc diam: 3.50 cm MITRAL VALVE MV Area (PHT): 4.80 cm    SHUNTS MV Decel Time: 158 msec    Systemic VTI:  0.19 m MV E velocity: 84.80 cm/s  Systemic Diam: 2.10 cm MV A velocity: 67.30 cm/s MV E/A ratio:  1.26 Jenkins Rouge MD Electronically signed by Jenkins Rouge MD Signature Date/Time: 09/04/2021/3:38:52 PM    Final    VAS [place] LOWER EXTREMITY VENOUS (DVT)  Result Date: 09/04/2021  Lower Venous DVT Study Patient Name:  JAELYN CLONINGER  Date of Exam:   09/04/2021 Medical Rec #: [id number]          Accession #:    [id number] Date of Birth: Oct 12, 1976          Patient Gender: M Patient Age:   13 years Exam Location:  [facility] Procedure:      VAS [place] LOWER EXTREMITY VENOUS  (DVT) Referring Phys: TIMOTHY OPYD --------------------------------------------------------------------------------  Indications: Edema.  Risk Factors: DVT. Comparison Study: 09/03/2021 - POSITIVE for gastrocnemius and posterior tibial                   (calf) DVT                   without propagation above the knee. Performing Technologist: Oliver Hum RVT  Examination Guidelines: A complete evaluation includes B-mode imaging, spectral Doppler, color Doppler, and power Doppler as needed of all accessible portions of each vessel. Bilateral testing is considered an integral part of a complete examination. Limited examinations for reoccurring indications may be performed as noted. The reflux portion of the exam is performed with the patient in reverse Trendelenburg.  +---------+---------------+---------+-----------+----------+--------------+ RIGHT    CompressibilityPhasicitySpontaneityPropertiesThrombus Aging +---------+---------------+---------+-----------+----------+--------------+ CFV      Full  Yes      Yes                                 +---------+---------------+---------+-----------+----------+--------------+ SFJ      Full                                                        +---------+---------------+---------+-----------+----------+--------------+ FV Prox  Full                                                        +---------+---------------+---------+-----------+----------+--------------+ FV Mid   Full                                                        +---------+---------------+---------+-----------+----------+--------------+ FV DistalFull                                                        +---------+---------------+---------+-----------+----------+--------------+ PFV      Full                                                        +---------+---------------+---------+-----------+----------+--------------+ POP      Full            Yes      Yes                                 +---------+---------------+---------+-----------+----------+--------------+ PTV      Full                                                        +---------+---------------+---------+-----------+----------+--------------+ PERO     Full                                                        +---------+---------------+---------+-----------+----------+--------------+   +----+---------------+---------+-----------+----------+--------------+ LEFTCompressibilityPhasicitySpontaneityPropertiesThrombus Aging +----+---------------+---------+-----------+----------+--------------+ CFV Full           Yes      Yes                                 +----+---------------+---------+-----------+----------+--------------+    Summary: RIGHT: - There is no evidence of deep vein thrombosis  in the lower extremity.  - No cystic structure found in the popliteal fossa.  LEFT: - No evidence of common femoral vein obstruction.  *See table(s) above for measurements and observations. Electronically signed by Harold Barban MD on 09/04/2021 at 9:22:10 PM.    Final     Microbiology: Recent Results (from the past 240 hour(s))  Resp Panel by RT-PCR (Flu A&B, Covid) Nasopharyngeal Swab     Status: None   Collection Time: 09/03/21  8:24 PM   Specimen: Nasopharyngeal Swab; Nasopharyngeal(NP) swabs in vial transport medium  Result Value Ref Range Status   SARS Coronavirus 2 by RT PCR NEGATIVE NEGATIVE Final    Comment: (NOTE) SARS-CoV-2 target nucleic acids are NOT DETECTED.  The SARS-CoV-2 RNA is generally detectable in upper respiratory specimens during the acute phase of infection. The lowest concentration of SARS-CoV-2 viral copies this assay can detect is 138 copies/mL. A negative result does not preclude SARS-Cov-2 infection and should not be used as the sole basis for treatment or other patient management decisions. A negative result may occur with   improper specimen collection/handling, submission of specimen other than nasopharyngeal swab, presence of viral mutation(s) within the areas targeted by this assay, and inadequate number of viral copies(<138 copies/mL). A negative result must be combined with clinical observations, patient history, and epidemiological information. The expected result is Negative.  Fact Sheet for Patients:  EntrepreneurPulse.com.au  Fact Sheet for Healthcare Providers:  IncredibleEmployment.be  This test is no t yet approved or cleared by the [place] FDA and  has been authorized for detection and/or diagnosis of SARS-CoV-2 by FDA under an Emergency Use Authorization (EUA). This EUA will remain  in effect (meaning this test can be used) for the duration of the COVID-19 declaration under Section 564(b)(1) of the Act, 21 U.S.C.section 360bbb-3(b)(1), unless the authorization is terminated  or revoked sooner.       Influenza A by PCR NEGATIVE NEGATIVE Final   Influenza B by PCR NEGATIVE NEGATIVE Final    Comment: (NOTE) The Xpert Xpress SARS-CoV-2/FLU/RSV plus assay is intended as an aid in the diagnosis of influenza from Nasopharyngeal swab specimens and should not be used as a sole basis for treatment. Nasal washings and aspirates are unacceptable for Xpert Xpress SARS-CoV-2/FLU/RSV testing.  Fact Sheet for Patients: EntrepreneurPulse.com.au  Fact Sheet for Healthcare Providers: IncredibleEmployment.be  This test is not yet approved or cleared by the [place] FDA and has been authorized for detection and/or diagnosis of SARS-CoV-2 by FDA under an Emergency Use Authorization (EUA). This EUA will remain in effect (meaning this test can be used) for the duration of the COVID-19 declaration under Section 564(b)(1) of the Act, 21 U.S.C. section 360bbb-3(b)(1), unless the authorization is terminated  or revoked.  Performed at [facility], [address] [place], [place] [address]   C Difficile Quick Screen w PCR reflex     Status: None   Collection Time: 09/05/21 12:56 PM   Specimen: STOOL  Result Value Ref Range Status   C Diff antigen NEGATIVE NEGATIVE Final   C Diff toxin NEGATIVE NEGATIVE Final   C Diff interpretation No C. difficile detected.  Final    Comment: Performed at [facility], [place] [address], [place], [place] [address]     Labs: Basic Metabolic Panel: Recent Labs  Lab 09/04/21 0520 09/05/21 0313 09/06/21 0436 09/07/21 0500 09/08/21 0409  NA 136 140 134* 135 134*  K 3.9 4.2 3.6 3.7 4.1  CL  106 109 104 105 104  CO2 21* 25 24 24 25   GLUCOSE 87 144* 144* 81 107*  BUN 15 14 16 18 19   CREATININE 0.73 0.72 0.83 0.75 0.72  CALCIUM 7.6* 8.6* 8.3* 8.2* 8.2*  MG  --   --   --   --  2.7*   Liver Function Tests: Recent Labs  Lab 09/03/21 1719  AST 15  ALT 17  ALKPHOS 75  BILITOT 0.4  PROT 7.1  ALBUMIN 3.0*   No results for input(s): LIPASE, AMYLASE in the last 168 hours. No results for input(s): AMMONIA in the last 168 hours. CBC: Recent Labs  Lab 09/03/21 1719 09/04/21 1300 09/06/21 0436 09/06/21 2126 09/07/21 0500 09/07/21 1508 09/08/21 0409  WBC 11.2*  --  19.7* 19.6* 18.0*  --  18.2*  NEUTROABS 8.2*  --   --  13.9*  --   --   --   HGB 5.5*   < > 7.9* 8.2* 7.9* 8.8* 8.5*  HCT 20.0*   < > 26.6* 28.0* 27.1* 30.9* 29.4*  MCV 64.1*  --  70.6* 72.7* 73.4*  --  76.2*  PLT 498*  --  433* 472* 438*  --  420*   < > = values in this interval not displayed.   Cardiac Enzymes: No results for input(s): CKTOTAL, CKMB, CKMBINDEX, TROPONINI in the last 168 hours. BNP: BNP (last 3 results) Recent Labs    09/03/21 1719  BNP 46.1    ProBNP (last 3 results) No results for input(s): PROBNP in the last 8760 hours.  CBG: No results for input(s): GLUCAP in the last 168 hours.     Signed:  Irine Seal MD.   [facility] 09/08/2021, 3:09 PM

## 2021-09-08 NOTE — Progress Notes (Signed)
[facility] Progress Note  Reginald Parker 45 y.o. 03/27/1976  CC: Ulcerative colitis, rectal bleeding   Subjective: Patient seen and examined at bedside.  Feeling better today.  Denies any acute GI issues.  Nonbloody bowel movement this morning.  Denies abdominal pain, nausea or vomiting.  ROS : Negative for chest pain negative for nausea and vomiting.   Objective: Vital signs in last 24 hours: Vitals:   09/08/21 0445 09/08/21 0904  BP: 117/76 123/81  Pulse: 78 (!) 107  Resp: 16 14  Temp: 98.6 F (37 C) 98.1 F (36.7 C)  SpO2: 97% 99%    Physical Exam:  General:  Alert, cooperative, no distress, appears stated age  Head:  Normocephalic, without obvious abnormality, atraumatic  Eyes:  , EOM's intact,   Lungs:   Clear to auscultation bilaterally, respirations unlabored  Heart:  Regular rate and rhythm, S1, S2 normal  Abdomen:   Soft, non-tender, nondistended, bowel sounds present  Extremities: Extremities normal, atraumatic, no  edema  Pulses: 2+ and symmetric    Lab Results: Recent Labs    09/07/21 0500 09/08/21 0409  NA 135 134*  K 3.7 4.1  CL 105 104  CO2 24 25  GLUCOSE 81 107*  BUN 18 19  CREATININE 0.75 0.72  CALCIUM 8.2* 8.2*   No results for input(s): AST, ALT, ALKPHOS, BILITOT, PROT, ALBUMIN in the last 72 hours. Recent Labs    09/06/21 2126 09/07/21 0500 09/07/21 1508 09/08/21 0409  WBC 19.6* 18.0*  --  18.2*  NEUTROABS 13.9*  --   --   --   HGB 8.2* 7.9* 8.8* 8.5*  HCT 28.0* 27.1* 30.9* 29.4*  MCV 72.7* 73.4*  --  76.2*  PLT 472* 438*  --  420*   No results for input(s): LABPROT, INR in the last 72 hours.    Assessment/Plan: -Ulcerative pancolitis with rectal bleeding -Symptomatic anemia.  Hemoglobin stable -DVT/PE -started on Eliquis September 06, 2021.  Was on heparin prior to that.   Recommendations ----------------------- -No further rectal bleeding.  Hemoglobin stable. -Okay to discharge home from GI standpoint. -  prednisone taper  40 mg in the morning and 20 mg at night time for 1 week followed by 40 mg once a day for 4-week and then 10 mg taper every week. -Okay to continue Eliquis 5 mg twice a day from GI standpoint. -Follow-up with Dr. Michail Parker in the next few weeks after discharge to arrange for treatment with biological agent. -GI will sign off.  Call us back if needed  Reginald Brace MD, Reginald Parker 09/08/2021, 10:15 AM  Contact #  [phone]

## 2021-09-10 ENCOUNTER — Other Ambulatory Visit: Payer: Self-pay

## 2021-09-10 ENCOUNTER — Encounter: Payer: Self-pay | Admitting: Podiatry

## 2021-09-10 ENCOUNTER — Ambulatory Visit: Payer: BC Managed Care – PPO | Admitting: Podiatry

## 2021-09-10 DIAGNOSIS — M722 Plantar fascial fibromatosis: Secondary | ICD-10-CM | POA: Diagnosis not present

## 2021-09-10 DIAGNOSIS — M79672 Pain in left foot: Secondary | ICD-10-CM

## 2021-09-10 DIAGNOSIS — I82402 Acute embolism and thrombosis of unspecified deep veins of left lower extremity: Secondary | ICD-10-CM

## 2021-09-10 DIAGNOSIS — R609 Edema, unspecified: Secondary | ICD-10-CM | POA: Diagnosis not present

## 2021-09-10 DIAGNOSIS — B351 Tinea unguium: Secondary | ICD-10-CM

## 2021-09-10 NOTE — Patient Instructions (Signed)
I have ordered a medication for you that will come from [place] in [place]. They should be calling you to verify insurance and will mail the medication to you. If you live close by then you can go by their pharmacy to pick up the medication. Their phone number is [phone]. If you do not hear from them in the next few days, please give [place] a call at [phone].   Plantar Fasciitis (Heel Spur Syndrome) with Rehab The plantar fascia is a fibrous, ligament-like, soft-tissue structure that spans the bottom of the foot. Plantar fasciitis is a condition that causes pain in the foot due to inflammation of the tissue. SYMPTOMS  Pain and tenderness on the underneath side of the foot. Pain that worsens with standing or walking. CAUSES  Plantar fasciitis is caused by irritation and injury to the plantar fascia on the underneath side of the foot. Common mechanisms of injury include: Direct trauma to bottom of the foot. Damage to a small nerve that runs under the foot where the main fascia attaches to the heel bone. Stress placed on the plantar fascia due to bone spurs. RISK INCREASES WITH:  Activities that place stress on the plantar fascia (running, jumping, pivoting, or cutting). Poor strength and flexibility. Improperly fitted shoes. Tight calf muscles. Flat feet. Failure to warm-up properly before activity. Obesity. PREVENTION Warm up and stretch properly before activity. Allow for adequate recovery between workouts. Maintain physical fitness: Strength, flexibility, and endurance. Cardiovascular fitness. Maintain a health body weight. Avoid stress on the plantar fascia. Wear properly fitted shoes, including arch supports for individuals who have flat feet.  PROGNOSIS  If treated properly, then the symptoms of plantar fasciitis usually resolve without surgery. However, occasionally surgery is necessary.  RELATED COMPLICATIONS  Recurrent symptoms that may result in a  chronic condition. Problems of the lower back that are caused by compensating for the injury, such as limping. Pain or weakness of the foot during push-off following surgery. Chronic inflammation, scarring, and partial or complete fascia tear, occurring more often from repeated injections.  TREATMENT  Treatment initially involves the use of ice and medication to help reduce pain and inflammation. The use of strengthening and stretching exercises may help reduce pain with activity, especially stretches of the Achilles tendon. These exercises may be performed at home or with a therapist. Your caregiver may recommend that you use heel cups of arch supports to help reduce stress on the plantar fascia. Occasionally, corticosteroid injections are given to reduce inflammation. If symptoms persist for greater than 6 months despite non-surgical (conservative), then surgery may be recommended.   MEDICATION  If pain medication is necessary, then nonsteroidal anti-inflammatory medications, such as aspirin and ibuprofen, or other minor pain relievers, such as acetaminophen, are often recommended. Do not take pain medication within 7 days before surgery. Prescription pain relievers may be given if deemed necessary by your caregiver. Use only as directed and only as much as you need. Corticosteroid injections may be given by your caregiver. These injections should be reserved for the most serious cases, because they may only be given a certain number of times.  HEAT AND COLD Cold treatment (icing) relieves pain and reduces inflammation. Cold treatment should be applied for 10 to 15 minutes every 2 to 3 hours for inflammation and pain and immediately after any activity that aggravates your symptoms. Use ice packs or massage the area with a piece of ice (ice massage). Heat treatment may be used prior to performing the stretching  and strengthening activities prescribed by your caregiver, physical therapist, or  athletic trainer. Use a heat pack or soak the injury in warm water.  SEEK IMMEDIATE MEDICAL CARE IF: Treatment seems to offer no benefit, or the condition worsens. Any medications produce adverse side effects.  EXERCISES- RANGE OF MOTION (ROM) AND STRETCHING EXERCISES - Plantar Fasciitis (Heel Spur Syndrome) These exercises may help you when beginning to rehabilitate your injury. Your symptoms may resolve with or without further involvement from your physician, physical therapist or athletic trainer. While completing these exercises, remember:  Restoring tissue flexibility helps normal motion to return to the joints. This allows healthier, less painful movement and activity. An effective stretch should be held for at least 30 seconds. A stretch should never be painful. You should only feel a gentle lengthening or release in the stretched tissue.  RANGE OF MOTION - Toe Extension, Flexion Sit with your right / left leg crossed over your opposite knee. Grasp your toes and gently pull them back toward the top of your foot. You should feel a stretch on the bottom of your toes and/or foot. Hold this stretch for 10 seconds. Now, gently pull your toes toward the bottom of your foot. You should feel a stretch on the top of your toes and or foot. Hold this stretch for 10 seconds. Repeat  times. Complete this stretch 3 times per day.   RANGE OF MOTION - Ankle Dorsiflexion, Active Assisted Remove shoes and sit on a chair that is preferably not on a carpeted surface. Place right / left foot under knee. Extend your opposite leg for support. Keeping your heel down, slide your right / left foot back toward the chair until you feel a stretch at your ankle or calf. If you do not feel a stretch, slide your bottom forward to the edge of the chair, while still keeping your heel down. Hold this stretch for 10 seconds. Repeat 3 times. Complete this stretch 2 times per day.   STRETCH  Gastroc, Standing Place  hands on wall. Extend right / left leg, keeping the front knee somewhat bent. Slightly point your toes inward on your back foot. Keeping your right / left heel on the floor and your knee straight, shift your weight toward the wall, not allowing your back to arch. You should feel a gentle stretch in the right / left calf. Hold this position for 10 seconds. Repeat 3 times. Complete this stretch 2 times per day.  STRETCH  Soleus, Standing Place hands on wall. Extend right / left leg, keeping the other knee somewhat bent. Slightly point your toes inward on your back foot. Keep your right / left heel on the floor, bend your back knee, and slightly shift your weight over the back leg so that you feel a gentle stretch deep in your back calf. Hold this position for 10 seconds. Repeat 3 times. Complete this stretch 2 times per day.  STRETCH  Gastrocsoleus, Standing  Note: This exercise can place a lot of stress on your foot and ankle. Please complete this exercise only if specifically instructed by your caregiver.  Place the ball of your right / left foot on a step, keeping your other foot firmly on the same step. Hold on to the wall or a rail for balance. Slowly lift your other foot, allowing your body weight to press your heel down over the edge of the step. You should feel a stretch in your right / left calf. Hold this position for  10 seconds. Repeat this exercise with a slight bend in your right / left knee. Repeat 3 times. Complete this stretch 2 times per day.   STRENGTHENING EXERCISES - Plantar Fasciitis (Heel Spur Syndrome)  These exercises may help you when beginning to rehabilitate your injury. They may resolve your symptoms with or without further involvement from your physician, physical therapist or athletic trainer. While completing these exercises, remember:  Muscles can gain both the endurance and the strength needed for everyday activities through controlled exercises. Complete  these exercises as instructed by your physician, physical therapist or athletic trainer. Progress the resistance and repetitions only as guided.  STRENGTH - Towel Curls Sit in a chair positioned on a non-carpeted surface. Place your foot on a towel, keeping your heel on the floor. Pull the towel toward your heel by only curling your toes. Keep your heel on the floor. Repeat 3 times. Complete this exercise 2 times per day.  STRENGTH - Ankle Inversion Secure one end of a rubber exercise band/tubing to a fixed object (table, pole). Loop the other end around your foot just before your toes. Place your fists between your knees. This will focus your strengthening at your ankle. Slowly, pull your big toe up and in, making sure the band/tubing is positioned to resist the entire motion. Hold this position for 10 seconds. Have your muscles resist the band/tubing as it slowly pulls your foot back to the starting position. Repeat 3 times. Complete this exercises 2 times per day.  Document Released: 11/10/2005 Document Revised: 02/02/2012 Document Reviewed: 02/22/2009 [facility] Patient Information 2014 [place], [place].

## 2021-09-12 ENCOUNTER — Telehealth: Payer: Self-pay

## 2021-09-12 NOTE — Telephone Encounter (Signed)
Transition Care Management Unsuccessful Follow-up Telephone Call  Date of discharge and from where:  [facility] 09/03/21-09/08/21  Attempts:  1st Attempt  Reason for unsuccessful TCM follow-up call:  Left voice message  Johnney Killian, RN, BSN, CCM Care Management Coordinator Phone: [phone] / Fax: [phone]

## 2021-09-13 ENCOUNTER — Telehealth: Payer: Self-pay

## 2021-09-13 DIAGNOSIS — K8301 Primary sclerosing cholangitis: Secondary | ICD-10-CM | POA: Diagnosis not present

## 2021-09-13 DIAGNOSIS — K5100 Ulcerative (chronic) pancolitis without complications: Secondary | ICD-10-CM | POA: Diagnosis not present

## 2021-09-13 NOTE — Telephone Encounter (Signed)
Transition Care Management Follow-up Telephone Call Date of discharge and from where: 09/08/2021  [facility] How have you been since you were released from the hospital? "Doing Better" Any questions or concerns? No  Items Reviewed: Did the pt receive and understand the discharge instructions provided? Yes  Medications obtained and verified? Yes  Other? No  Any new allergies since your discharge? No  Dietary orders reviewed? Yes Do you have support at home? Yes   Home Care and Equipment/Supplies: Were home health services ordered? no If so, what is the name of the agency?  Has the agency set up a time to come to the patient's home? not applicable Were any new equipment or medical supplies ordered?  No What is the name of the medical supply agency?  Were you able to get the supplies/equipment? not applicable Do you have any questions related to the use of the equipment or supplies? No  Functional Questionnaire: (I = Independent and D = Dependent) ADLs: I  Bathing/Dressing- I  Meal Prep- I  Eating- I  Maintaining continence- I  Transferring/Ambulation- I  Managing Meds- I  Follow up appointments reviewed:  PCP Hospital f/u appt confirmed? Yes  Scheduled to see Larose Kells on 1025/2022 Specialist Hospital f/u appt confirmed? No   Are transportation arrangements needed? No  If their condition worsens, is the pt aware to call PCP or go to the Emergency Dept.? Yes Was the patient provided with contact information for the PCP's office or ED? Yes Was to pt encouraged to call back with questions or concerns? Yes  Tomasa Rand, RN, BSN, CEN [facility] [employer] [phone]

## 2021-09-17 ENCOUNTER — Ambulatory Visit (INDEPENDENT_AMBULATORY_CARE_PROVIDER_SITE_OTHER): Payer: BC Managed Care – PPO | Admitting: Internal Medicine

## 2021-09-17 ENCOUNTER — Encounter: Payer: Self-pay | Admitting: Internal Medicine

## 2021-09-17 ENCOUNTER — Other Ambulatory Visit: Payer: Self-pay

## 2021-09-17 VITALS — BP 132/68 | HR 91 | Temp 98.2°F | Resp 16 | Ht 66.0 in | Wt 162.1 lb

## 2021-09-17 DIAGNOSIS — K922 Gastrointestinal hemorrhage, unspecified: Secondary | ICD-10-CM | POA: Diagnosis not present

## 2021-09-17 DIAGNOSIS — Z23 Encounter for immunization: Secondary | ICD-10-CM

## 2021-09-17 DIAGNOSIS — I82402 Acute embolism and thrombosis of unspecified deep veins of left lower extremity: Secondary | ICD-10-CM | POA: Diagnosis not present

## 2021-09-17 DIAGNOSIS — D509 Iron deficiency anemia, unspecified: Secondary | ICD-10-CM | POA: Diagnosis not present

## 2021-09-17 LAB — CBC WITH DIFFERENTIAL/PLATELET
Basophils Absolute: 0 10*3/uL (ref 0.0–0.1)
Basophils Relative: 0.3 % (ref 0.0–3.0)
Eosinophils Absolute: 0 10*3/uL (ref 0.0–0.7)
Eosinophils Relative: 0.1 % (ref 0.0–5.0)
HCT: 33.5 % — ABNORMAL LOW (ref 39.0–52.0)
Hemoglobin: 10 g/dL — ABNORMAL LOW (ref 13.0–17.0)
Lymphocytes Relative: 10.4 % — ABNORMAL LOW (ref 12.0–46.0)
Lymphs Abs: 1 10*3/uL (ref 0.7–4.0)
MCHC: 29.9 g/dL — ABNORMAL LOW (ref 30.0–36.0)
MCV: 79.7 fl (ref 78.0–100.0)
Monocytes Absolute: 1.3 10*3/uL — ABNORMAL HIGH (ref 0.1–1.0)
Monocytes Relative: 13.1 % — ABNORMAL HIGH (ref 3.0–12.0)
Neutro Abs: 7.4 10*3/uL (ref 1.4–7.7)
Neutrophils Relative %: 76.1 % (ref 43.0–77.0)
Platelets: 410 10*3/uL — ABNORMAL HIGH (ref 150.0–400.0)
RBC: 4.2 Mil/uL — ABNORMAL LOW (ref 4.22–5.81)
RDW: 36.7 % — ABNORMAL HIGH (ref 11.5–15.5)
WBC: 9.8 10*3/uL (ref 4.0–10.5)

## 2021-09-17 LAB — IBC + FERRITIN
Ferritin: 95.6 ng/mL (ref 22.0–322.0)
Iron: 28 ug/dL — ABNORMAL LOW (ref 42–165)
Saturation Ratios: 8.8 % — ABNORMAL LOW (ref 20.0–50.0)
TIBC: 317.8 ug/dL (ref 250.0–450.0)
Transferrin: 227 mg/dL (ref 212.0–360.0)

## 2021-09-17 LAB — COMPREHENSIVE METABOLIC PANEL
ALT: 50 U/L (ref 0–53)
AST: 14 U/L (ref 0–37)
Albumin: 3.4 g/dL — ABNORMAL LOW (ref 3.5–5.2)
Alkaline Phosphatase: 83 U/L (ref 39–117)
BUN: 19 mg/dL (ref 6–23)
CO2: 30 mEq/L (ref 19–32)
Calcium: 8.5 mg/dL (ref 8.4–10.5)
Chloride: 102 mEq/L (ref 96–112)
Creatinine, Ser: 0.72 mg/dL (ref 0.40–1.50)
GFR: 110.04 mL/min (ref >60.00)
Glucose, Bld: 89 mg/dL (ref 70–99)
Potassium: 3.9 mEq/L (ref 3.5–5.1)
Sodium: 138 mEq/L (ref 135–145)
Total Bilirubin: 0.6 mg/dL (ref 0.2–1.2)
Total Protein: 6.2 g/dL (ref 6.0–8.3)

## 2021-09-17 MED ORDER — POLYSACCHARIDE IRON COMPLEX 150 MG PO CAPS
150.0000 mg | ORAL_CAPSULE | Freq: Every day | ORAL | 3 refills | Status: DC
Start: 2021-09-17 — End: 2022-04-02

## 2021-09-17 MED ORDER — APIXABAN 5 MG PO TABS: 5.0000 mg | ORAL_TABLET | Freq: Two times a day (BID) | ORAL | 2 refills | Status: DC

## 2021-09-17 NOTE — Progress Notes (Signed)
Subjective: 45 year old male presents the office today for follow evaluation of left ankle swelling, pain.  He states that since I last saw him the pain to his foot is doing much better.  Still wearing the cam boot.  I did previously order venous duplex Which did reveal a DVT.  I recommend him to the emergency department for which he went.  Hemoglobin was low and he is admitted to the hospital.  He states that overall he is feeling much better.  No fevers or chills.  No chest pain or shortness of breath.  No other concerns.  Objective: AAO x3, NAD DP/PT pulses palpable bilaterally, CRT less than 3 seconds Nails are very hypertrophic, dystrophic yellow-brown discoloration.  No pain in the nails no swelling or redness or drainage. There is still some residual swelling present to the left foot, ankle, leg.  Patient much improved.  There is no erythema or warmth. No pain with calf compression, swelling, warmth, erythema  Assessment: Improved swelling, DVT; onychomycosis  Plan: -All treatment options discussed with the patient including all alternatives, risks, complications.  -Transition to regular shoe as tolerated.  We discussed stretching, icing daily. -In regards to the nail fungus he was contacted by the compound pharmacy, [facility] when he was in the hospital.  He finally called to get this sent to him. -Patient encouraged to call the office with any questions, concerns, change in symptoms.   Return in about 8 weeks (around 11/05/2021).  Trula Slade DPM

## 2021-09-17 NOTE — Patient Instructions (Addendum)
Recommend to proceed with new covid booster at your convenience.   Seek medical attention if chest pain, difficulty breathing, abdominal pain, blood in the stools  GO TO THE LAB : Get the blood work     [place], [place] back for a checkup in 3 months

## 2021-09-17 NOTE — Progress Notes (Signed)
Subjective:    Patient ID: Reginald Parker, male    DOB: 07-01-1976, 45 y.o.   MRN: [id number]  DOS:  09/17/2021 Type of visit - description: TCM 14  Back in June, he got Evusheld, he tolerated it well however developed bilateral lower extremity edema on and off.  About a week prior to admission he developed severe heel pain, he could not work, he was less active than before but not on bedrest. 1 day prior to admission, he developed severe left leg swelling at the calf. With above symptoms went to podiatry, ultrasound show a DVT. He was referred to the ER and subsequently admitted and  and discharged 09/08/2021.  At the same time, he was tapering down prednisone prescribed for Crohn's disease and developed diarrhea and subsequently blood in the stools (before he was anticoagulated)  At the ER she was a slightly tachycardic, CT chest showed no central PE, hemoglobin was 5.5.  During the admission was transfused 2 units of RBCs, hemoglobin stabilized at 8.5. Oral prednisone increased by GI, bloody bowel movements improved. C. difficile negative. As far as the DVT, Eliquis initial high dose was stopped and he was placed on 5 mg twice daily.   Noted to have PVCs during the admission, not new to him.  Review of Systems Since he left the hospital, he reports good compliance with medication including high-dose prednisone and iron by mouth. No further blood in the stools. Appetite is good. No nausea or vomiting. No diarrhea but the stools are still loose. Denies chest pain or palpitations. LE swelling has decreased.   Past Medical History:  Diagnosis Date   Crohn's disease ([place])    universal colitis   Elevated liver function tests    Osteoporosis 06/01/2015   Premature ventricular contractions    Primary sclerosing cholangitis    Seasonal allergies     Past Surgical History:  Procedure Laterality Date   NASAL SINUS SURGERY  2012    Allergies as of 09/17/2021        Reactions   Ibuprofen Other (See Comments)   Causes Heartburn        Medication List        Accurate as of September 17, 2021 11:59 PM. If you have any questions, ask your nurse or doctor.          albuterol 108 (90 Base) MCG/ACT inhaler Commonly known as: Ventolin HFA Inhale 2 puffs into the lungs every 6 (six) hours as needed for wheezing or shortness of breath.   apixaban 5 MG Tabs tablet Commonly known as: ELIQUIS Take 1 tablet (5 mg total) by mouth 2 (two) times daily.   carvedilol 3.125 MG tablet Commonly known as: COREG Take 1 tablet (3.125 mg total) by mouth 2 (two) times daily with a meal.   cetirizine 10 MG tablet Commonly known as: ZYRTEC Take 10 mg by mouth daily.   iron polysaccharides 150 MG capsule Commonly known as: NIFEREX Take 1 capsule (150 mg total) by mouth daily.   NON [facility]  Antifungal- (nail)-#1  Dr Lawernce Pitts   ondansetron 4 MG tablet Commonly known as: ZOFRAN Take 1 tablet (4 mg total) by mouth every 6 (six) hours as needed for nausea.   pantoprazole 40 MG tablet Commonly known as: PROTONIX Take 1 tablet (40 mg total) by mouth daily at 6 (six) AM.   predniSONE 10 MG tablet Commonly known as: DELTASONE Take 2-4 tablets (20-40 mg total) by mouth daily. Take prednisone 40 mg (  4 tablets) in the morning and then prednisone 20 mg (2 tablets) in the evening x1 week, then take prednisone 40 mg (4 tablets) daily x4 weeks, then take prednisone 30 mg (3 tablets) daily until seen by GI.   ursodiol 500 MG tablet Commonly known as: ACTIGALL Take 500 mg by mouth 2 (two) times daily.           Objective:   Physical Exam BP 132/68 (BP Location: Left Arm, Patient Position: Sitting, Cuff Size: Small)   Pulse 91   Temp 98.2 F (36.8 C) (Oral)   Resp 16   Ht 5' 6"  (1.676 m)   Wt 162 lb 2 oz (73.5 kg)   SpO2 98%   BMI 26.17 kg/m  General:   Well developed, NAD, BMI noted.  HEENT:  Normocephalic . Face  symmetric, atraumatic.  Pale appearing Lungs:  CTA B Normal respiratory effort, no intercostal retractions, no accessory muscle use. Heart: RRR,  no murmur.  Abdomen:  Not distended, soft, non-tender. No rebound or rigidity.   Skin: Not pale. Not jaundice Lower extremities: Calves symmetric, he still have some puffiness at the ankles, worse on the L. Neurologic:  alert & oriented X3.  Speech normal, gait appropriate for age and unassisted Psych--  Cognition and judgment appear intact.  Cooperative with normal attention span and concentration.  Behavior appropriate. No anxious or depressed appearing.     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  Rx observation unless symptoms Covid infex 01-2021  PLAN L leg DVT: This is the first time he has a clot, diagnosed via [place] 09/03/2021.  Prior to the clot, he had heel pain and could not work, was less active but not on bedrest.  No recent prolonged trips or airplane trips. 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 Situation was complicated by GI bleed, currently he seems to be doing okay on  Eliquis 5 mg twice daily. Plan: RF Eliquis,reassess in 3 months. Crohn's disease primary sclerosing cholangitis, recent GI bleed with a hemoglobin of 5  Had a colonoscopy 07-2021, subsequently, prednisone dose was decreased slowly, he however developed diarrhea and blood in the stools (even prior to anticoagulation). Was admitted to the hospital w/ a DVT, transfused 2 PRBCs for acute anemia and is now on iron. He is now on a high dose of steroids, GI symptoms much improved, saw GI 10/21, labs done (results?), plans per pt is to start  "biologicals" for Crohn's. Plan: Check CMP, CBC, iron panel. Flu shot today RTC 3 months     This visit occurred during the SARS-CoV-2 public health emergency.  Safety protocols were in place, including screening questions prior to the visit, additional usage of staff PPE, and extensive cleaning of exam room while observing appropriate contact time as indicated for disinfecting solutions.

## 2021-09-18 NOTE — Assessment & Plan Note (Signed)
L leg DVT: This is the first time he has a clot, diagnosed via [place] 09/03/2021.  Prior to the clot, he had heel pain and could not work, was less active but not on bedrest.  No recent prolonged trips or airplane trips. 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 Situation was complicated by GI bleed, currently he seems to be doing okay on  Eliquis 5 mg twice daily. Plan: RF Eliquis,reassess in 3 months. Crohn's disease primary sclerosing cholangitis, recent GI bleed with a hemoglobin of 5  Had a colonoscopy 07-2021, subsequently, prednisone dose was decreased slowly, he however developed diarrhea and blood in the stools (even prior to anticoagulation). Was admitted to the hospital w/ a DVT, transfused 2 PRBCs for acute anemia and is now on iron. He is now on a high dose of steroids, GI symptoms much improved, saw GI 10/21, labs done (results?), plans per pt is to start "biologicals" for Crohn's. Plan: Check CMP, CBC, iron panel. Flu shot today RTC 3 months

## 2021-09-19 DIAGNOSIS — K5100 Ulcerative (chronic) pancolitis without complications: Secondary | ICD-10-CM | POA: Diagnosis not present

## 2021-10-03 DIAGNOSIS — K5100 Ulcerative (chronic) pancolitis without complications: Secondary | ICD-10-CM | POA: Diagnosis not present

## 2021-10-07 ENCOUNTER — Ambulatory Visit: Payer: BC Managed Care – PPO | Admitting: Internal Medicine

## 2021-10-31 DIAGNOSIS — K5100 Ulcerative (chronic) pancolitis without complications: Secondary | ICD-10-CM | POA: Diagnosis not present

## 2021-11-05 ENCOUNTER — Ambulatory Visit (INDEPENDENT_AMBULATORY_CARE_PROVIDER_SITE_OTHER): Payer: BC Managed Care – PPO | Admitting: Podiatry

## 2021-11-05 ENCOUNTER — Other Ambulatory Visit: Payer: Self-pay

## 2021-11-05 DIAGNOSIS — B351 Tinea unguium: Secondary | ICD-10-CM | POA: Diagnosis not present

## 2021-11-05 DIAGNOSIS — M79672 Pain in left foot: Secondary | ICD-10-CM | POA: Diagnosis not present

## 2021-11-05 DIAGNOSIS — M722 Plantar fascial fibromatosis: Secondary | ICD-10-CM

## 2021-11-08 NOTE — Progress Notes (Signed)
Subjective: 45 year old male presents the office today for follow evaluation of left ankle pain, swelling.  He states that he is doing much better.  Some swelling to the end of the day.  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  No specific injury that he reports.  He has been using the compound medication through [facility] for the nail fungus and he thinks that starting to get better and the nails are less thick.  No swelling redness or drainage.  No other concerns today.  Objective: AAO x3, NAD DP/PT pulses palpable bilaterally, CRT less than 3 seconds Not able to elicit any area pinpoint tenderness to the left foot or ankle.  There is slight edema there is no erythema or warmth.  Ankle, subtalar range of motion intact.  No significant pain with plantarflexion today. Nails continue be hypertrophic, dystrophic with yellow-brown discoloration however they do appear to be more light in color and not as thick.  There is no pain in the nails there is no swelling or redness or any signs of infection. No pain with calf compression, swelling, warmth, erythema  Assessment: Onychomycosis, left ankle swelling likely result of DVT, tendinitis  Plan: -All treatment options discussed with the patient including all alternatives, risks, complications.  -As a courtesy debride the nails without any complications or bleeding.  Continue compound through [employer]. -Continue stretching exercises for the lower extremities as well as supportive shoe gear. -Patient encouraged to call the office with any questions, concerns, change in symptoms.   Trula Slade DPM

## 2021-12-13 DIAGNOSIS — K8301 Primary sclerosing cholangitis: Secondary | ICD-10-CM | POA: Diagnosis not present

## 2021-12-13 DIAGNOSIS — K5100 Ulcerative (chronic) pancolitis without complications: Secondary | ICD-10-CM | POA: Diagnosis not present

## 2021-12-13 LAB — BASIC METABOLIC PANEL
BUN: 12 (ref 4–21)
CO2: 29 — AB (ref 13–22)
Chloride: 106 (ref 99–108)
Creatinine: 0.7 (ref <=1.3)
Glucose: 107
Potassium: 4.9 mEq/L (ref 3.5–5.1)
Sodium: 139 (ref 137–147)

## 2021-12-13 LAB — HEPATIC FUNCTION PANEL
ALT: 24 U/L (ref 10–40)
AST: 16 (ref 14–40)
Alkaline Phosphatase: 98 (ref 25–125)
Bilirubin, Total: 0.5

## 2021-12-13 LAB — CBC AND DIFFERENTIAL
HCT: 30 — AB (ref 41–53)
Hemoglobin: 8.7 — AB (ref 13.5–17.5)
Neutrophils Absolute: 7
Platelets: 501 10*3/uL — AB (ref 150–400)
WBC: 8

## 2021-12-13 LAB — COMPREHENSIVE METABOLIC PANEL
Albumin: 4.2 (ref 3.5–5.0)
Calcium: 9.2 (ref 8.7–10.7)
eGFR: 115

## 2021-12-13 LAB — CBC: RBC: 4.55 (ref 3.87–5.11)

## 2021-12-18 ENCOUNTER — Ambulatory Visit (INDEPENDENT_AMBULATORY_CARE_PROVIDER_SITE_OTHER): Payer: BC Managed Care – PPO | Admitting: Internal Medicine

## 2021-12-18 ENCOUNTER — Encounter: Payer: Self-pay | Admitting: Internal Medicine

## 2021-12-18 VITALS — BP 126/66 | HR 94 | Temp 98.4°F | Resp 16 | Ht 66.0 in | Wt 178.4 lb

## 2021-12-18 DIAGNOSIS — K50919 Crohn's disease, unspecified, with unspecified complications: Secondary | ICD-10-CM

## 2021-12-18 DIAGNOSIS — D509 Iron deficiency anemia, unspecified: Secondary | ICD-10-CM | POA: Diagnosis not present

## 2021-12-18 DIAGNOSIS — Z86718 Personal history of other venous thrombosis and embolism: Secondary | ICD-10-CM | POA: Diagnosis not present

## 2021-12-18 NOTE — Patient Instructions (Signed)
Recommended a covid vaccine booster    GO TO THE FRONT DESK, PLEASE SCHEDULE YOUR APPOINTMENTS Come back for  a physical exam by 05-2022

## 2021-12-18 NOTE — Assessment & Plan Note (Signed)
Left leg DVT: Dx 08-2021, possibly unprovoked, finished 3 months of Eliquis.  Agree with stopping anticoagulants.  See next Crohn's disease, PSC:  Saw GI last week, labs done, current treatment: Prednisone, mesalamine a injectable (name?). Per [facility] creatinine las week  was normal.  Patient reports his hemoglobin dropped  from around 10 to 8.   Anemia is not symptomatic, still on iron supplements and a multivitamin. GI sxs much decreased but not completely asymptomatic, recommend to f/u w/ GI Preventive care: Flu shot done, recommend COVID-vaccine. RTC 05-2022 CPX

## 2021-12-18 NOTE — Progress Notes (Signed)
Subjective:    Patient ID: Reginald Parker, male    DOB: 07/25/1976, 46 y.o.   MRN: [id number]  DOS:  12/18/2021 Type of visit - description: f/u  Since the last office visit is doing well. Saw Dr. Michail Sermon -GI, last week, labs were done. He is now off anticoagulants.  Denies any fever chills No chest pain or difficulty breathing No nausea or vomiting.  Currently stools are slightly loose.  From time to time he sees drops of blood in the stools.  H/o left leg DVT, currently with no swelling or pain.   Review of Systems See above   Past Medical History:  Diagnosis Date   Crohn's disease ([place])    universal colitis   Elevated liver function tests    Osteoporosis 06/01/2015   Premature ventricular contractions    Primary sclerosing cholangitis    Seasonal allergies     Past Surgical History:  Procedure Laterality Date   NASAL SINUS SURGERY  2012    Current Outpatient Medications  Medication Instructions   albuterol (VENTOLIN HFA) 108 (90 Base) MCG/ACT inhaler 2 puffs, Inhalation, Every 6 hours PRN   apixaban (ELIQUIS) 5 mg, Oral, 2 times daily   carvedilol (COREG) 3.125 mg, Oral, 2 times daily with meals   cetirizine (ZYRTEC) 10 mg, Oral, Daily   iron polysaccharides (NIFEREX) 150 mg, Oral, Daily   NON FORMULARY [facility]<BR><BR>Antifungal- (nail)-#1<BR><BR>Dr Wagoner/L[facility]   ondansetron (ZOFRAN) 4 mg, Oral, Every 6 hours PRN   pantoprazole (PROTONIX) 40 mg, Oral, Daily   predniSONE (DELTASONE) 20-40 mg, Oral, Daily, Take prednisone 40 mg (4 tablets) in the morning and then prednisone 20 mg (2 tablets) in the evening x1 week, then take prednisone 40 mg (4 tablets) daily x4 weeks, then take prednisone 30 mg (3 tablets) daily until seen by GI.   ursodiol (ACTIGALL) 500 mg, Oral, 2 times daily       Objective:   Physical Exam BP 126/66 (BP Location: Left Arm, Patient Position: Sitting, Cuff Size: Small)    Pulse 94    Temp 98.4 F (36.9 C) (Oral)    Resp 16     Ht 5' 6"  (1.676 m)    Wt 178 lb 6 oz (80.9 kg)    SpO2 96%    BMI 28.79 kg/m  General:   Well developed, NAD, BMI noted.  HEENT:  Normocephalic . Face symmetric, atraumatic Lungs:  CTA B Normal respiratory effort, no intercostal retractions, no accessory muscle use. Heart: RRR,  no murmur.  Abdomen:  Not distended, soft, non-tender. No rebound or rigidity.   Skin: Not pale. Not jaundice Lower extremities: no pretibial edema bilaterally.  Calves soft and symmetric Neurologic:  alert & oriented X3.  Speech normal, gait appropriate for age and unassisted Psych--  Cognition and judgment appear intact.  Cooperative with normal attention span and concentration.  Behavior appropriate. No anxious or depressed appearing.     Assessment       Assessment Crohn's disease, universal colitis, cscope 09-2014, Dr Michail Sermon Primary sclerosing cholangitis Osteoporosis: d/t steroids, saw endocrine>> no Rx unless has a fx or needs ongoing steroids Allergies, B-spasm (saw allergist remotely, was rx qvar) Frequent PVCs: Echo 2020 normal, saw cardiology, event monitor 28.8% PVCs.  Rx observation unless symptoms Admitted 08-2021: GI bleed, 2 PRBCs, left leg DVT  PLAN Left leg DVT: Dx 08-2021, possibly unprovoked, finished 3 months of Eliquis.  Agree with stopping anticoagulants.  See next Crohn's disease, PSC:  Saw GI last week, labs done,  current treatment: Prednisone, mesalamine a injectable (name?). Per [facility] creatinine las week  was normal.  Patient reports his hemoglobin dropped  from around 10 to 8.   Anemia is not symptomatic, still on iron supplements and a multivitamin. GI sxs much decreased but not completely asymptomatic, recommend to f/u w/ GI Preventive care: Flu shot done, recommend COVID-vaccine. RTC 05-2022 CPX     This visit occurred during the SARS-CoV-2 public health emergency.  Safety protocols were in place, including screening questions prior to the visit, additional usage  of staff PPE, and extensive cleaning of exam room while observing appropriate contact time as indicated for disinfecting solutions.

## 2021-12-26 DIAGNOSIS — K5100 Ulcerative (chronic) pancolitis without complications: Secondary | ICD-10-CM | POA: Diagnosis not present

## 2021-12-27 DIAGNOSIS — K5100 Ulcerative (chronic) pancolitis without complications: Secondary | ICD-10-CM | POA: Diagnosis not present

## 2022-01-21 ENCOUNTER — Telehealth: Payer: Self-pay | Admitting: Internal Medicine

## 2022-01-21 NOTE — Telephone Encounter (Signed)
Pt came in office stating that provider recommended from last visit (12-16-2021) that pt is needing another lab work to verify his blood counts, pt is wanting to get lab orders if possible. Please advise and contact pt at [facility] [phone].

## 2022-01-21 NOTE — Telephone Encounter (Signed)
I reviewed my last note, I acknowledge he has anemia, my advice was to follow-up with GI.

## 2022-01-21 NOTE — Telephone Encounter (Signed)
Pt mainly speaks spanish- can you let Pt know of PCP recommendations please?

## 2022-01-21 NOTE — Telephone Encounter (Signed)
Please advise 

## 2022-01-22 NOTE — Telephone Encounter (Signed)
Patient was given recommendations from PCP, he stated he understood and will contact GI doctor.  ?

## 2022-01-24 DIAGNOSIS — K5100 Ulcerative (chronic) pancolitis without complications: Secondary | ICD-10-CM | POA: Diagnosis not present

## 2022-01-28 ENCOUNTER — Other Ambulatory Visit (HOSPITAL_COMMUNITY): Payer: Self-pay | Admitting: *Deleted

## 2022-01-29 ENCOUNTER — Ambulatory Visit (HOSPITAL_COMMUNITY)
Admission: RE | Admit: 2022-01-29 | Discharge: 2022-01-29 | Disposition: A | Discharge status: Home / Self Care | Payer: BC Managed Care – PPO | Source: Ambulatory Visit | Attending: Gastroenterology | Admitting: Gastroenterology

## 2022-01-29 ENCOUNTER — Other Ambulatory Visit: Payer: Self-pay

## 2022-01-29 DIAGNOSIS — K5190 Ulcerative colitis, unspecified, without complications: Secondary | ICD-10-CM | POA: Diagnosis not present

## 2022-01-29 DIAGNOSIS — D649 Anemia, unspecified: Secondary | ICD-10-CM | POA: Diagnosis not present

## 2022-01-29 LAB — CBC WITH DIFFERENTIAL/PLATELET
Abs Immature Granulocytes: 0.05 10*3/uL (ref 0.00–0.07)
Basophils Absolute: 0 10*3/uL (ref 0.0–0.1)
Basophils Relative: 0 %
Eosinophils Absolute: 0 10*3/uL (ref 0.0–0.5)
Eosinophils Relative: 0 %
HCT: 31.7 % — ABNORMAL LOW (ref 39.0–52.0)
Hemoglobin: 8.7 g/dL — ABNORMAL LOW (ref 13.0–17.0)
Immature Granulocytes: 1 %
Lymphocytes Relative: 18 %
Lymphs Abs: 1.2 10*3/uL (ref 0.7–4.0)
MCH: 18.9 pg — ABNORMAL LOW (ref 26.0–34.0)
MCHC: 27.4 g/dL — ABNORMAL LOW (ref 30.0–36.0)
MCV: 68.9 fL — ABNORMAL LOW (ref 80.0–100.0)
Monocytes Absolute: 0.2 10*3/uL (ref 0.1–1.0)
Monocytes Relative: 3 %
Neutro Abs: 5.2 10*3/uL (ref 1.7–7.7)
Neutrophils Relative %: 78 %
Platelets: 405 10*3/uL — ABNORMAL HIGH (ref 150–400)
RBC: 4.6 MIL/uL (ref 4.22–5.81)
RDW: 24.2 % — ABNORMAL HIGH (ref 11.5–15.5)
WBC: 6.7 10*3/uL (ref 4.0–10.5)
nRBC: 2.1 % — ABNORMAL HIGH (ref 0.0–0.2)

## 2022-01-29 LAB — PREPARE RBC (CROSSMATCH)

## 2022-01-29 MED ORDER — SODIUM CHLORIDE 0.9% IV SOLUTION: Freq: Once | INTRAVENOUS | Status: DC

## 2022-01-30 ENCOUNTER — Ambulatory Visit: Payer: BC Managed Care – PPO | Admitting: Podiatry

## 2022-01-30 DIAGNOSIS — K5100 Ulcerative (chronic) pancolitis without complications: Secondary | ICD-10-CM | POA: Diagnosis not present

## 2022-01-30 LAB — TYPE AND SCREEN
ABO/RH(D): B POS
Antibody Screen: NEGATIVE
Unit division: 0
Unit division: 0

## 2022-01-30 LAB — BPAM RBC
Blood Product Expiration Date: 202303112359
Blood Product Expiration Date: 202303292359
ISSUE DATE / TIME: 202303080909
ISSUE DATE / TIME: 202303081208
Unit Type and Rh: 5100
Unit Type and Rh: 7300

## 2022-02-13 DIAGNOSIS — K5100 Ulcerative (chronic) pancolitis without complications: Secondary | ICD-10-CM | POA: Diagnosis not present

## 2022-02-14 ENCOUNTER — Ambulatory Visit (INDEPENDENT_AMBULATORY_CARE_PROVIDER_SITE_OTHER): Payer: BC Managed Care – PPO | Admitting: Podiatry

## 2022-02-14 ENCOUNTER — Other Ambulatory Visit: Payer: Self-pay

## 2022-02-14 DIAGNOSIS — B351 Tinea unguium: Secondary | ICD-10-CM | POA: Diagnosis not present

## 2022-02-14 DIAGNOSIS — R609 Edema, unspecified: Secondary | ICD-10-CM | POA: Diagnosis not present

## 2022-02-15 DIAGNOSIS — B351 Tinea unguium: Secondary | ICD-10-CM | POA: Insufficient documentation

## 2022-02-15 DIAGNOSIS — M722 Plantar fascial fibromatosis: Secondary | ICD-10-CM | POA: Insufficient documentation

## 2022-02-15 NOTE — Progress Notes (Signed)
Subjective: ?46 year old male presents the office today for follow evaluation of left ankle pain, swelling.  Overall states that he is doing well to the ankle.  No significant pain.  Still gets intermittent swelling.  Over the nails is making somewhat better as far as the color goes.  No pain in the nails and no swelling or redness or drainage or signs of infection.  Using the compound cream through [facility] to help with this.  He has no new concerns.  ? ?Objective: ?AAO x3, NAD ?DP/PT pulses palpable bilaterally, CRT less than 3 seconds ?There is some chronic edema present of the left ankle but there is no erythema or warmth associated this.  There is no area pinpoint tenderness.  No pain to the flexor, extensor tendons.  Ankle, subtalar joint range of motion without any pain. ?Nails continue be hypertrophic, dystrophic with there is more clear in all the nails.  There is no drainage or pus or any signs of infection. ?No pain with calf compression, swelling, warmth, erythema ? ?Assessment: ?Onychomycosis, left ankle swelling likely result of DVT, tendinitis ? ?Plan: ?-All treatment options discussed with the patient including all alternatives, risks, complications. ?-I dispensed a compression anklet to help with left ankle swelling.  Continue with supportive shoe gear as well as continue stretching, icing daily. ?-Continue with the topical medication through [facility] for the nail fungus.  Discussed switching to the [place] or other medications.  Can hold off on oral medications now. ? ?Return in about 3 months (around 05/17/2022). ? ?Trula Slade DPM ?

## 2022-03-06 DIAGNOSIS — K5100 Ulcerative (chronic) pancolitis without complications: Secondary | ICD-10-CM | POA: Diagnosis not present

## 2022-03-11 DIAGNOSIS — K5100 Ulcerative (chronic) pancolitis without complications: Secondary | ICD-10-CM | POA: Diagnosis not present

## 2022-03-13 DIAGNOSIS — Z111 Encounter for screening for respiratory tuberculosis: Secondary | ICD-10-CM | POA: Diagnosis not present

## 2022-03-13 DIAGNOSIS — R5383 Other fatigue: Secondary | ICD-10-CM | POA: Diagnosis not present

## 2022-03-13 DIAGNOSIS — K5100 Ulcerative (chronic) pancolitis without complications: Secondary | ICD-10-CM | POA: Diagnosis not present

## 2022-03-19 DIAGNOSIS — D649 Anemia, unspecified: Secondary | ICD-10-CM | POA: Diagnosis not present

## 2022-03-19 DIAGNOSIS — K5100 Ulcerative (chronic) pancolitis without complications: Secondary | ICD-10-CM | POA: Diagnosis not present

## 2022-03-20 ENCOUNTER — Other Ambulatory Visit (HOSPITAL_COMMUNITY): Payer: Self-pay

## 2022-03-20 DIAGNOSIS — K5100 Ulcerative (chronic) pancolitis without complications: Secondary | ICD-10-CM

## 2022-03-21 ENCOUNTER — Ambulatory Visit (HOSPITAL_COMMUNITY)
Admission: RE | Admit: 2022-03-21 | Discharge: 2022-03-21 | Disposition: A | Discharge status: Home / Self Care | Payer: BC Managed Care – PPO | Source: Ambulatory Visit | Attending: Gastroenterology | Admitting: Gastroenterology

## 2022-03-21 DIAGNOSIS — K5100 Ulcerative (chronic) pancolitis without complications: Secondary | ICD-10-CM | POA: Insufficient documentation

## 2022-03-21 LAB — CBC
HCT: 32.2 % — ABNORMAL LOW (ref 39.0–52.0)
Hemoglobin: 8.9 g/dL — ABNORMAL LOW (ref 13.0–17.0)
MCH: 19 pg — ABNORMAL LOW (ref 26.0–34.0)
MCHC: 27.6 g/dL — ABNORMAL LOW (ref 30.0–36.0)
MCV: 68.7 fL — ABNORMAL LOW (ref 80.0–100.0)
Platelets: 362 10*3/uL (ref 150–400)
RBC: 4.69 MIL/uL (ref 4.22–5.81)
RDW: 25.9 % — ABNORMAL HIGH (ref 11.5–15.5)
WBC: 6.7 10*3/uL (ref 4.0–10.5)
nRBC: 0.4 % — ABNORMAL HIGH (ref 0.0–0.2)

## 2022-03-21 LAB — PREPARE RBC (CROSSMATCH)

## 2022-03-21 MED ORDER — SODIUM CHLORIDE 0.9% IV SOLUTION: Freq: Once | INTRAVENOUS | Status: DC

## 2022-03-21 NOTE — Progress Notes (Signed)
Office called to verify Hgb. Hemoglobin on 03/19/22 was 6..7  ?

## 2022-03-22 LAB — TYPE AND SCREEN
ABO/RH(D): B POS
Antibody Screen: NEGATIVE
Unit division: 0
Unit division: 0

## 2022-03-22 LAB — BPAM RBC
Blood Product Expiration Date: 202305122359
Blood Product Expiration Date: 202305132359
ISSUE DATE / TIME: 202304280937
ISSUE DATE / TIME: 202304281210
Unit Type and Rh: 7300
Unit Type and Rh: 7300

## 2022-04-02 ENCOUNTER — Other Ambulatory Visit: Payer: Self-pay | Admitting: Internal Medicine

## 2022-04-09 DIAGNOSIS — K5100 Ulcerative (chronic) pancolitis without complications: Secondary | ICD-10-CM | POA: Diagnosis not present

## 2022-04-10 DIAGNOSIS — K5100 Ulcerative (chronic) pancolitis without complications: Secondary | ICD-10-CM | POA: Diagnosis not present

## 2022-05-05 DIAGNOSIS — K5100 Ulcerative (chronic) pancolitis without complications: Secondary | ICD-10-CM | POA: Diagnosis not present

## 2022-05-08 DIAGNOSIS — K5100 Ulcerative (chronic) pancolitis without complications: Secondary | ICD-10-CM | POA: Diagnosis not present

## 2022-05-23 ENCOUNTER — Ambulatory Visit: Payer: BC Managed Care – PPO | Admitting: Podiatry

## 2022-05-23 DIAGNOSIS — B351 Tinea unguium: Secondary | ICD-10-CM

## 2022-05-23 DIAGNOSIS — R609 Edema, unspecified: Secondary | ICD-10-CM

## 2022-05-24 NOTE — Progress Notes (Signed)
Subjective: 46 year old male presents the office today for follow evaluation of left ankle pain, swelling.  States he still gets some swelling but has no pain to the ankle.  He still been using a topical medication for the nail fungus which she states that the nails are looking more "clean".  No swelling or redness or any drainage.  No new injuries or concerns.    Objective: AAO x3, NAD DP/PT pulses palpable bilaterally, CRT less than 3 seconds There is some chronic edema present of the left ankle but there is no erythema or warmth associated this.  There is no tenderness associated with swelling.  Ankle, subtalar joint range of motion intact but any restriction or discomfort.  No crepitation with range of motion. Nails continue be hypertrophic, dystrophic however appears to be more clear in color.  There is no drainage or pus or any signs of infection.  No pain with calf compression, swelling, warmth, erythema  Assessment: Onychomycosis, left ankle swelling likely result of DVT, tendinitis  Plan: -All treatment options discussed with the patient including all alternatives, risks, complications. -Continue compression anklet.  Is asking for another one today which dispensed.  Continue to elevate, limit salt intake as well as pain. -Continue with the topical medication through [facility] for the nail fungus.   Trula Slade DPM

## 2022-06-13 DIAGNOSIS — K5100 Ulcerative (chronic) pancolitis without complications: Secondary | ICD-10-CM | POA: Diagnosis not present

## 2022-06-13 LAB — CBC AND DIFFERENTIAL
HCT: 25 — AB (ref 41–53)
Hemoglobin: 7.1 — AB (ref 13.5–17.5)
Neutrophils Absolute: 5.1
Platelets: 419 10*3/uL — AB (ref 150–400)
WBC: 9.3

## 2022-06-13 LAB — CBC: RBC: 3.95 (ref 3.87–5.11)

## 2022-06-17 ENCOUNTER — Ambulatory Visit (INDEPENDENT_AMBULATORY_CARE_PROVIDER_SITE_OTHER): Payer: BC Managed Care – PPO | Admitting: Internal Medicine

## 2022-06-17 ENCOUNTER — Encounter: Payer: Self-pay | Admitting: Internal Medicine

## 2022-06-17 VITALS — BP 132/70 | HR 75 | Temp 98.4°F | Resp 16 | Ht 66.0 in | Wt 178.1 lb

## 2022-06-17 DIAGNOSIS — Z0000 Encounter for general adult medical examination without abnormal findings: Secondary | ICD-10-CM

## 2022-06-17 DIAGNOSIS — R739 Hyperglycemia, unspecified: Secondary | ICD-10-CM

## 2022-06-17 DIAGNOSIS — D84821 Immunodeficiency due to drugs: Secondary | ICD-10-CM | POA: Diagnosis not present

## 2022-06-17 DIAGNOSIS — Z23 Encounter for immunization: Secondary | ICD-10-CM | POA: Diagnosis not present

## 2022-06-17 DIAGNOSIS — T380X5A Adverse effect of glucocorticoids and synthetic analogues, initial encounter: Secondary | ICD-10-CM

## 2022-06-17 DIAGNOSIS — K50919 Crohn's disease, unspecified, with unspecified complications: Secondary | ICD-10-CM

## 2022-06-17 DIAGNOSIS — Z7952 Long term (current) use of systemic steroids: Secondary | ICD-10-CM

## 2022-06-17 LAB — HEMOGLOBIN A1C: Hgb A1c MFr Bld: 6.2 % (ref 4.6–6.5)

## 2022-06-17 LAB — BASIC METABOLIC PANEL
BUN: 14 mg/dL (ref 6–23)
CO2: 27 mEq/L (ref 19–32)
Calcium: 8.5 mg/dL (ref 8.4–10.5)
Chloride: 104 mEq/L (ref 96–112)
Creatinine, Ser: 0.77 mg/dL (ref 0.40–1.50)
GFR: 107.26 mL/min (ref >60.00)
Glucose, Bld: 75 mg/dL (ref 70–99)
Potassium: 3.9 mEq/L (ref 3.5–5.1)
Sodium: 138 mEq/L (ref 135–145)

## 2022-06-17 LAB — LIPID PANEL
Cholesterol: 139 mg/dL (ref 0–200)
HDL: 57.9 mg/dL (ref >39.00)
LDL Cholesterol: 61 mg/dL (ref 0–99)
NonHDL: 81.32
Total CHOL/HDL Ratio: 2
Triglycerides: 104 mg/dL (ref 0.0–149.0)
VLDL: 20.8 mg/dL (ref 0.0–40.0)

## 2022-06-17 LAB — VITAMIN D 25 HYDROXY (VIT D DEFICIENCY, FRACTURES): VITD: 19.06 ng/mL — ABNORMAL LOW (ref 30.00–100.00)

## 2022-06-17 MED ORDER — ALENDRONATE SODIUM 70 MG PO TABS: 70.0000 mg | ORAL_TABLET | ORAL | 11 refills | Status: DC

## 2022-06-17 MED ORDER — PREDNISONE 10 MG PO TABS: 40.0000 mg | ORAL_TABLET | Freq: Every day | ORAL | Status: DC

## 2022-06-17 MED ORDER — POLYSACCHARIDE IRON COMPLEX 150 MG PO CAPS: ORAL_CAPSULE | ORAL | 1 refills | Status: DC

## 2022-06-17 NOTE — Progress Notes (Unsigned)
Subjective:    Patient ID: Reginald Parker, male    DOB: 01/31/1976, 46 y.o.   MRN: [id number]  DOS:  06/17/2022 Type of visit - description: CPX  Here for CPX. Crohn's disease symptoms are all controlled.  Currently on biological high doses of steroids.  Denies fever, chest pain, cough. No LUTS  Review of Systems See above   Past Medical History:  Diagnosis Date   Crohn's disease ([place])    universal colitis   Elevated liver function tests    Osteoporosis 06/01/2015   Premature ventricular contractions    Primary sclerosing cholangitis    Seasonal allergies     Past Surgical History:  Procedure Laterality Date   NASAL SINUS SURGERY  2012    Current Outpatient Medications  Medication Instructions   albuterol (VENTOLIN HFA) 108 (90 Base) MCG/ACT inhaler 2 puffs, Inhalation, Every 6 hours PRN   cetirizine (ZYRTEC) 10 mg, Oral, Daily   inFLIXimab-axxq (AVSOLA) 100 MG injection Intravenous, As directed   iron polysaccharides (NIFEREX) 150 MG capsule TAKE 1 CAPSULE BY MOUTH EVERY DAY   mesalamine (LIALDA) 1.2 g EC tablet Oral, Daily with breakfast, Dose per GI   NON FORMULARY [facility]<BR><BR>Antifungal- (nail)-#1<BR><BR>Dr Wagoner/[facility]   pantoprazole (PROTONIX) 40 mg, Oral, Daily   predniSONE (DELTASONE) 15 mg, Oral, Daily with breakfast   ursodiol (ACTIGALL) 500 mg, Oral, 2 times daily       Objective:   Physical Exam BP 132/70   Pulse 75   Temp 98.4 F (36.9 C) (Oral)   Resp 16   Ht 5' 6"  (1.676 m)   Wt 178 lb 2 oz (80.8 kg)   SpO2 97%   BMI 28.75 kg/m  General: Well developed, NAD, BMI noted Neck: No  thyromegaly  HEENT:  Normocephalic . Face symmetric, atraumatic Lungs:  CTA B Normal respiratory effort, no intercostal retractions, no accessory muscle use. Heart: RRR,  no murmur.  Abdomen:  Not distended, soft, non-tender. No rebound or rigidity.   Lower extremities: +/+++  pretibial edema bilaterally  Skin: Exposed areas without rash. Not  pale. Not jaundice Neurologic:  alert & oriented X3.  Speech normal, gait appropriate for age and unassisted Strength symmetric and appropriate for age.  Psych: Cognition and judgment appear intact.  Cooperative with normal attention span and concentration.  Behavior appropriate. No anxious or depressed appearing.     Assessment   Assessment Crohn's disease, universal colitis, cscope 09-2014, Dr Michail Sermon Primary sclerosing cholangitis Osteoporosis: d/t steroids, saw endocrine>> no Rx unless has a fx or needs ongoing steroids Allergies, B-spasm (saw allergist remotely, was rx qvar) Frequent PVCs: Echo 2020 normal, saw cardiology, event monitor 28.8% PVCs.  Rx observation unless symptoms Admitted 08-2021: GI bleed, 2 PRBCs, left leg DVT  PLAN Here for CPX Crohn's disease: Symptoms have not been well controlled for a while, on biological prednisone 40 mg. GI note 11-2021, he was felt not to be in complete remission. Labs: Normal CMP, hemoglobin 8.7.  Platelet 501, WBCs 8.0. Labs 06/13/2022: Hemoglobin 7.1, he is a scheduled for a transfusion in 3 days. He has been referred to local GI to [facility]. Refill iron Osteoporosis: Previous seen by Endo, was recommended Rx if he needs ongoing steroids.  Has been on steroid for a while, current dose is prednisone 40 mg. Plan: DEXA, start Fosamax, precautions discussed. RTC 6 months  -Td 2017   -PNM 23 2017 - PNM 20: Today, due to immunosuppressive status - covid vax x 3 per pt thus booster  rec  -Colon cancer screening:  per GI, last cscope  07-2021 Prostate cancer screening: Not indicated Diet and exercise: Counseled. Labs:  BMP FLP A1c DEXA vitamin D  Left leg DVT: Dx 08-2021, possibly unprovoked, finished 3 months of Eliquis.  Agree with stopping anticoagulants.  See next Crohn's disease, PSC:  Saw GI last week, labs done, current treatment: Prednisone, mesalamine a injectable (name?). Per [facility] creatinine las week  was normal.   Patient reports his hemoglobin dropped  from around 10 to 8.   Anemia is not symptomatic, still on iron supplements and a multivitamin. GI sxs much decreased but not completely asymptomatic, recommend to f/u w/ GI Preventive care: Flu shot done, recommend COVID-vaccine. RTC 05-2022 CPX

## 2022-06-17 NOTE — Patient Instructions (Addendum)
Recommend to proceed with covid booster (bivalent) at your pharmacy. Flu shot this fall.   I am ordering a bone density test  Start Fosamax 70 mg: Take 1 tablet once a week. On an empty stomach, do not lie down or eat for 35 to 40 minutes.  Start vitamin D3: 2000 units daily   GO TO THE LAB : Get the blood work     [place], [place] back for a checkup in 6 months    Alendronate Tablets What is this medication? ALENDRONATE (a LEN droe nate) prevents and treats osteoporosis. It may also be used to treat Paget disease of the bone. It works by Paramedic stronger and less likely to break (fracture). It belongs to a group of medications called bisphosphonates. This medicine may be used for other purposes; ask your health care provider or pharmacist if you have questions. COMMON BRAND NAME(S): Fosamax What should I tell my care team before I take this medication? They need to know if you have any of these conditions: Bleeding disorder Cancer Dental disease Difficulty swallowing Infection (fever, chills, cough, sore throat, pain or trouble passing urine) Kidney disease Low levels of calcium or other minerals in the blood Low red blood cell counts Receiving steroids like dexamethasone or prednisone Stomach or intestine problems Trouble sitting or standing for 30 minutes An unusual or allergic reaction to alendronate, other medications, foods, dyes or preservatives Pregnant or trying to get pregnant Breast-feeding How should I use this medication? Take this medication by mouth with a full glass of water. Take it as directed on the prescription label at the same time every day. Take the dose right after waking up. Do not eat or drink anything before taking it. Do not take it with any other drink except water. Do not chew or crush the tablet. After taking it, do not eat breakfast, drink, or take any other medications or vitamins for at  least 30 minutes. Sit or stand up for at least 30 minutes after you take it. Do not lie down. Keep taking it unless your care team tells you to stop. A special MedGuide will be given to you by the pharmacist with each prescription and refill. Be sure to read this information carefully each time. Talk to your care team about the use of this medication in children. Special care may be needed. Overdosage: If you think you have taken too much of this medicine contact a poison control center or emergency room at once. NOTE: This medicine is only for you. Do not share this medicine with others. What if I miss a dose? If you take your medication once a day, skip it. Take your next dose at the scheduled time the next morning. Do not take two doses on the same day. If you take your medication once a week, take the missed dose on the morning after you remember. Do not take two doses on the same day. What may interact with this medication? Aluminum hydroxide Antacids Aspirin Calcium supplements Medications for inflammation like ibuprofen, naproxen, and others Iron supplements Magnesium supplements Vitamins with minerals This list may not describe all possible interactions. Give your health care provider a list of all the medicines, herbs, non-prescription drugs, or dietary supplements you use. Also tell them if you smoke, drink alcohol, or use illegal drugs. Some items may interact with your medicine. What should I watch for while using this medication? Visit your care team for  regular checks on your progress. It may be some time before you see the benefit from this medication. Some people who take this medication have severe bone, joint, or muscle pain. This medication may also increase your risk for jaw problems or a broken thigh bone. Tell your care team right away if you have severe pain in your jaw, bones, joints, or muscles. Tell you care team if you have any pain that does not go away or that gets  worse. Tell your dentist and dental surgeon that you are taking this medication. You should not have major dental surgery while on this medication. See your dentist to have a dental exam and fix any dental problems before starting this medication. Take good care of your teeth while on this medication. Make sure you see your dentist for regular follow-up appointments. You should make sure you get enough calcium and vitamin D while you are taking this medication. Discuss the foods you eat and the vitamins you take with your care team. You may need blood work done while you are taking this medication. What side effects may I notice from receiving this medication? Side effects that you should report to your care team as soon as possible: Allergic reactions--skin rash, itching, hives, swelling of the face, lips, tongue, or throat Low calcium level--muscle pain or cramps, confusion, tingling, or numbness in the hands or feet Osteonecrosis of the jaw--pain, swelling, or redness in the mouth, numbness of the jaw, poor healing after dental work, unusual discharge from the mouth, visible bones in the mouth Pain or trouble swallowing Severe bone, joint, or muscle pain Stomach bleeding--bloody or black, tar-like stools, vomiting blood or brown material that looks like coffee grounds Side effects that usually do not require medical attention (report to your care team if they continue or are bothersome): Constipation Diarrhea Nausea Stomach pain This list may not describe all possible side effects. Call your doctor for medical advice about side effects. You may report side effects to FDA at 1-800-FDA-1088. Where should I keep my medication? Keep out of the reach of children and pets. Store at room temperature between 15 and 30 degrees C (59 and 86 degrees F). Throw away any unused medication after the expiration date. NOTE: This sheet is a summary. It may not cover all possible information. If you have  questions about this medicine, talk to your doctor, pharmacist, or health care provider.  2023 Elsevier/Gold Standard (2020-11-22 00:00:00)

## 2022-06-18 ENCOUNTER — Encounter: Payer: Self-pay | Admitting: Internal Medicine

## 2022-06-18 DIAGNOSIS — D649 Anemia, unspecified: Secondary | ICD-10-CM | POA: Diagnosis not present

## 2022-06-18 NOTE — Assessment & Plan Note (Signed)
-  Td 2017   -PNM 23 2017 - PNM 20: Today, due to immunosuppressive status - covid vax x 3 per pt thus booster rec -Colon cancer screening:  per GI, last cscope  07-2021 Prostate cancer screening: Not indicated Diet and exercise: Counseled. Labs:  BMP FLP A1c DEXA vitamin D

## 2022-06-18 NOTE — Assessment & Plan Note (Signed)
Here for CPX Crohn's disease: active disease  for a while, on biologicals and  prednisone 40 mg. GI note 11-2021 reviewed >>> was felt not to be in complete remission. Labs 11-2021: Normal CMP, hemoglobin 8.7.  Platelet 501, WBCs 8.0. Labs 06/13/2022: Hemoglobin 7.1, he is a scheduled for a transfusion in 3 days. He has been referred to local GI to [facility]. Refill iron Osteoporosis: Previous seen by Endo, was recommended Rx if he needs ongoing steroids.  Has been on steroid for a while, current dose is prednisone 40 mg. Plan: DEXA, start Fosamax, precautions discussed. RTC 6 months

## 2022-06-19 ENCOUNTER — Other Ambulatory Visit (HOSPITAL_COMMUNITY): Payer: Self-pay | Admitting: *Deleted

## 2022-06-19 DIAGNOSIS — D649 Anemia, unspecified: Secondary | ICD-10-CM

## 2022-06-19 MED ORDER — VITAMIN D (ERGOCALCIFEROL) 1.25 MG (50000 UNIT) PO CAPS: 50000.0000 [IU] | ORAL_CAPSULE | ORAL | 0 refills | Status: DC

## 2022-06-19 NOTE — Addendum Note (Signed)
Addended byDamita Dunnings D on: 06/19/2022 09:21 AM   Modules accepted: Orders

## 2022-06-20 ENCOUNTER — Ambulatory Visit (HOSPITAL_COMMUNITY)
Admission: RE | Admit: 2022-06-20 | Discharge: 2022-06-20 | Disposition: A | Discharge status: Home / Self Care | Payer: BC Managed Care – PPO | Source: Ambulatory Visit | Attending: Gastroenterology | Admitting: Gastroenterology

## 2022-06-20 DIAGNOSIS — D649 Anemia, unspecified: Secondary | ICD-10-CM | POA: Insufficient documentation

## 2022-06-20 LAB — CBC
HCT: 33.9 % — ABNORMAL LOW (ref 39.0–52.0)
Hemoglobin: 9.5 g/dL — ABNORMAL LOW (ref 13.0–17.0)
MCH: 20 pg — ABNORMAL LOW (ref 26.0–34.0)
MCHC: 28 g/dL — ABNORMAL LOW (ref 30.0–36.0)
MCV: 71.4 fL — ABNORMAL LOW (ref 80.0–100.0)
Platelets: 330 10*3/uL (ref 150–400)
RBC: 4.75 MIL/uL (ref 4.22–5.81)
RDW: 24.1 % — ABNORMAL HIGH (ref 11.5–15.5)
WBC: 5.1 10*3/uL (ref 4.0–10.5)
nRBC: 0.4 % — ABNORMAL HIGH (ref 0.0–0.2)

## 2022-06-20 LAB — PREPARE RBC (CROSSMATCH)

## 2022-06-20 MED ORDER — SODIUM CHLORIDE 0.9% IV SOLUTION: Freq: Once | INTRAVENOUS | Status: DC

## 2022-06-21 LAB — TYPE AND SCREEN
ABO/RH(D): B POS
Antibody Screen: NEGATIVE
Unit division: 0
Unit division: 0

## 2022-06-21 LAB — BPAM RBC
Blood Product Expiration Date: 202308152359
Blood Product Expiration Date: 202308192359
ISSUE DATE / TIME: 202307280928
ISSUE DATE / TIME: 202307281133
Unit Type and Rh: 7300
Unit Type and Rh: 7300

## 2022-06-22 DIAGNOSIS — S76011A Strain of muscle, fascia and tendon of right hip, initial encounter: Secondary | ICD-10-CM | POA: Diagnosis not present

## 2022-06-24 ENCOUNTER — Encounter (HOSPITAL_COMMUNITY): Payer: BC Managed Care – PPO

## 2022-06-26 DIAGNOSIS — K8301 Primary sclerosing cholangitis: Secondary | ICD-10-CM | POA: Diagnosis not present

## 2022-06-26 DIAGNOSIS — K51919 Ulcerative colitis, unspecified with unspecified complications: Secondary | ICD-10-CM | POA: Diagnosis not present

## 2022-06-26 DIAGNOSIS — Z86711 Personal history of pulmonary embolism: Secondary | ICD-10-CM | POA: Diagnosis not present

## 2022-06-26 DIAGNOSIS — J329 Chronic sinusitis, unspecified: Secondary | ICD-10-CM | POA: Diagnosis not present

## 2022-06-26 DIAGNOSIS — Z6828 Body mass index (BMI) 28.0-28.9, adult: Secondary | ICD-10-CM | POA: Diagnosis not present

## 2022-06-26 DIAGNOSIS — Z792 Long term (current) use of antibiotics: Secondary | ICD-10-CM | POA: Diagnosis not present

## 2022-06-27 DIAGNOSIS — D500 Iron deficiency anemia secondary to blood loss (chronic): Secondary | ICD-10-CM | POA: Insufficient documentation

## 2022-06-30 ENCOUNTER — Other Ambulatory Visit (HOSPITAL_BASED_OUTPATIENT_CLINIC_OR_DEPARTMENT_OTHER): Payer: BC Managed Care – PPO

## 2022-07-04 DIAGNOSIS — K51919 Ulcerative colitis, unspecified with unspecified complications: Secondary | ICD-10-CM | POA: Diagnosis not present

## 2022-07-04 DIAGNOSIS — K5100 Ulcerative (chronic) pancolitis without complications: Secondary | ICD-10-CM | POA: Diagnosis not present

## 2022-07-04 DIAGNOSIS — K529 Noninfective gastroenteritis and colitis, unspecified: Secondary | ICD-10-CM | POA: Diagnosis not present

## 2022-07-04 DIAGNOSIS — Z09 Encounter for follow-up examination after completed treatment for conditions other than malignant neoplasm: Secondary | ICD-10-CM | POA: Diagnosis not present

## 2022-07-04 DIAGNOSIS — Z8719 Personal history of other diseases of the digestive system: Secondary | ICD-10-CM | POA: Diagnosis not present

## 2022-07-08 ENCOUNTER — Ambulatory Visit (HOSPITAL_BASED_OUTPATIENT_CLINIC_OR_DEPARTMENT_OTHER)
Admission: RE | Admit: 2022-07-08 | Discharge: 2022-07-08 | Disposition: A | Discharge status: Home / Self Care | Payer: BC Managed Care – PPO | Source: Ambulatory Visit | Attending: Internal Medicine | Admitting: Internal Medicine

## 2022-07-08 DIAGNOSIS — T380X5A Adverse effect of glucocorticoids and synthetic analogues, initial encounter: Secondary | ICD-10-CM | POA: Diagnosis not present

## 2022-07-08 DIAGNOSIS — Z7952 Long term (current) use of systemic steroids: Secondary | ICD-10-CM | POA: Diagnosis not present

## 2022-07-08 DIAGNOSIS — M810 Age-related osteoporosis without current pathological fracture: Secondary | ICD-10-CM | POA: Diagnosis not present

## 2022-07-08 DIAGNOSIS — D84821 Immunodeficiency due to drugs: Secondary | ICD-10-CM | POA: Diagnosis not present

## 2022-07-10 DIAGNOSIS — Z6827 Body mass index (BMI) 27.0-27.9, adult: Secondary | ICD-10-CM | POA: Diagnosis not present

## 2022-07-10 DIAGNOSIS — Z79899 Other long term (current) drug therapy: Secondary | ICD-10-CM | POA: Diagnosis not present

## 2022-07-10 DIAGNOSIS — K51919 Ulcerative colitis, unspecified with unspecified complications: Secondary | ICD-10-CM | POA: Diagnosis not present

## 2022-07-10 DIAGNOSIS — Z86718 Personal history of other venous thrombosis and embolism: Secondary | ICD-10-CM | POA: Diagnosis not present

## 2022-07-10 DIAGNOSIS — K5190 Ulcerative colitis, unspecified, without complications: Secondary | ICD-10-CM | POA: Diagnosis not present

## 2022-07-10 DIAGNOSIS — Z886 Allergy status to analgesic agent status: Secondary | ICD-10-CM | POA: Diagnosis not present

## 2022-07-14 DIAGNOSIS — D500 Iron deficiency anemia secondary to blood loss (chronic): Secondary | ICD-10-CM | POA: Diagnosis not present

## 2022-07-18 ENCOUNTER — Other Ambulatory Visit: Payer: Self-pay | Admitting: Gastroenterology

## 2022-07-18 DIAGNOSIS — K8301 Primary sclerosing cholangitis: Secondary | ICD-10-CM

## 2022-07-19 ENCOUNTER — Other Ambulatory Visit: Payer: BC Managed Care – PPO

## 2022-07-24 DIAGNOSIS — D500 Iron deficiency anemia secondary to blood loss (chronic): Secondary | ICD-10-CM | POA: Diagnosis not present

## 2022-07-29 DIAGNOSIS — K529 Noninfective gastroenteritis and colitis, unspecified: Secondary | ICD-10-CM | POA: Diagnosis not present

## 2022-07-29 DIAGNOSIS — Z86718 Personal history of other venous thrombosis and embolism: Secondary | ICD-10-CM | POA: Diagnosis not present

## 2022-07-29 DIAGNOSIS — G8918 Other acute postprocedural pain: Secondary | ICD-10-CM | POA: Diagnosis not present

## 2022-07-29 DIAGNOSIS — Z886 Allergy status to analgesic agent status: Secondary | ICD-10-CM | POA: Diagnosis not present

## 2022-07-29 DIAGNOSIS — Z79899 Other long term (current) drug therapy: Secondary | ICD-10-CM | POA: Diagnosis not present

## 2022-07-29 DIAGNOSIS — K51919 Ulcerative colitis, unspecified with unspecified complications: Secondary | ICD-10-CM | POA: Diagnosis not present

## 2022-07-29 DIAGNOSIS — Z8616 Personal history of COVID-19: Secondary | ICD-10-CM | POA: Diagnosis not present

## 2022-07-29 HISTORY — PX: LAPAROSCOPIC TOTAL ABDOMINAL COLECTOMY: SHX5932

## 2022-08-07 DIAGNOSIS — Z932 Ileostomy status: Secondary | ICD-10-CM | POA: Diagnosis not present

## 2022-08-18 ENCOUNTER — Ambulatory Visit: Payer: BC Managed Care – PPO | Admitting: Internal Medicine

## 2022-08-18 ENCOUNTER — Encounter: Payer: Self-pay | Admitting: Internal Medicine

## 2022-08-18 VITALS — BP 122/66 | HR 98 | Temp 98.2°F | Resp 16 | Ht 66.0 in | Wt 168.4 lb

## 2022-08-18 DIAGNOSIS — K50919 Crohn's disease, unspecified, with unspecified complications: Secondary | ICD-10-CM

## 2022-08-18 DIAGNOSIS — R739 Hyperglycemia, unspecified: Secondary | ICD-10-CM | POA: Diagnosis not present

## 2022-08-18 DIAGNOSIS — R051 Acute cough: Secondary | ICD-10-CM | POA: Diagnosis not present

## 2022-08-18 DIAGNOSIS — J9801 Acute bronchospasm: Secondary | ICD-10-CM | POA: Diagnosis not present

## 2022-08-18 LAB — BASIC METABOLIC PANEL
BUN: 8 mg/dL (ref 6–23)
CO2: 27 mEq/L (ref 19–32)
Calcium: 9.2 mg/dL (ref 8.4–10.5)
Chloride: 106 mEq/L (ref 96–112)
Creatinine, Ser: 0.77 mg/dL (ref 0.40–1.50)
GFR: 107.14 mL/min (ref >60.00)
Glucose, Bld: 153 mg/dL — ABNORMAL HIGH (ref 70–99)
Potassium: 3.9 mEq/L (ref 3.5–5.1)
Sodium: 141 mEq/L (ref 135–145)

## 2022-08-18 LAB — CBC WITH DIFFERENTIAL/PLATELET
Basophils Absolute: 0.1 10*3/uL (ref 0.0–0.1)
Basophils Relative: 0.7 % (ref 0.0–3.0)
Eosinophils Absolute: 0.4 10*3/uL (ref 0.0–0.7)
Eosinophils Relative: 4.8 % (ref 0.0–5.0)
HCT: 39.7 % (ref 39.0–52.0)
Hemoglobin: 12.7 g/dL — ABNORMAL LOW (ref 13.0–17.0)
Lymphocytes Relative: 19.5 % (ref 12.0–46.0)
Lymphs Abs: 1.7 10*3/uL (ref 0.7–4.0)
MCHC: 32 g/dL (ref 30.0–36.0)
MCV: 86.5 fl (ref 78.0–100.0)
Monocytes Absolute: 0.8 10*3/uL (ref 0.1–1.0)
Monocytes Relative: 8.4 % (ref 3.0–12.0)
Neutro Abs: 6 10*3/uL (ref 1.4–7.7)
Neutrophils Relative %: 66.6 % (ref 43.0–77.0)
Platelets: 321 10*3/uL (ref 150.0–400.0)
RBC: 4.6 Mil/uL (ref 4.22–5.81)
RDW: 30.3 % — ABNORMAL HIGH (ref 11.5–15.5)
WBC: 8.9 10*3/uL (ref 4.0–10.5)

## 2022-08-18 MED ORDER — ALBUTEROL SULFATE HFA 108 (90 BASE) MCG/ACT IN AERS: 2.0000 | INHALATION_SPRAY | Freq: Four times a day (QID) | RESPIRATORY_TRACT | 5 refills | Status: DC | PRN

## 2022-08-18 NOTE — Progress Notes (Unsigned)
Subjective:    Patient ID: Reginald Parker, male    DOB: 1975/12/12, 46 y.o.   MRN: [id number]  DOS:  08/18/2022 Type of visit - description: Follow-up  To me few days ago, here for a checkup. Doing well in general. Denies nausea vomiting. Ileostomy output seems normal with no blood. No fever or chills.  Did develop some cough after the surgery, no chest pain or difficulty breathing, question of wheezing.  History of DVT, denies any calf pain or swelling   Review of Systems See above   Past Medical History:  Diagnosis Date   Crohn's disease ([place])    universal colitis   Elevated liver function tests    Osteoporosis 06/01/2015   Premature ventricular contractions    Primary sclerosing cholangitis    Seasonal allergies     Past Surgical History:  Procedure Laterality Date   LAPAROSCOPIC TOTAL ABDOMINAL COLECTOMY  07/29/2022   w/ end ileostomy   NASAL SINUS SURGERY  11/24/2010    Current Outpatient Medications  Medication Instructions   albuterol (VENTOLIN HFA) 108 (90 Base) MCG/ACT inhaler 2 puffs, Inhalation, Every 6 hours PRN   alendronate (FOSAMAX) 70 mg, Oral, Every 7 days, Take with a full glass of water on an empty stomach.   cetirizine (ZYRTEC) 10 mg, Oral, Daily   inFLIXimab-axxq (AVSOLA) 100 MG injection Inject into the vein. As directed   iron polysaccharides (NIFEREX) 150 MG capsule TAKE 1 CAPSULE BY MOUTH EVERY DAY   mesalamine (LIALDA) 1.2 g EC tablet Daily with breakfast   NON FORMULARY [facility]<BR><BR>Antifungal- (nail)-#1<BR><BR>Dr Wagoner/Lisa   pantoprazole (PROTONIX) 40 mg, Oral, Daily   predniSONE (DELTASONE) 40 mg, Oral, Daily with breakfast   ursodiol (ACTIGALL) 500 mg, Oral, 2 times daily   Vitamin D (Ergocalciferol) (DRISDOL) 50,000 Units, Oral, Every 7 days, For 12 weeks       Objective:   Physical Exam BP 122/66   Pulse 98   Temp 98.2 F (36.8 C) (Oral)   Resp 16   Ht 5' 6"  (1.676 m)   Wt 168 lb 6 oz (76.4 kg)   SpO2  93%   BMI 27.18 kg/m  General:   Well developed, NAD, BMI noted.  HEENT:  Normocephalic . Face symmetric, atraumatic Lungs:  CTA B Normal respiratory effort, no intercostal retractions, no accessory muscle use. Heart: RRR,  no murmur.  Abdomen:  Not distended, soft, surgical scars slightly red without warmness or unusual tenderness.  Ileostomy bag in place. Skin: Not pale. Not jaundice Lower extremities: no pretibial edema bilaterally  Neurologic:  alert & oriented X3.  Speech normal, gait appropriate for age and unassisted Psych--  Cognition and judgment appear intact.  Cooperative with normal attention span and concentration.  Behavior appropriate. No anxious or depressed appearing.     Assessment     Assessment GI: Crohn's disease, universal colitis, cscope 09-2014, Dr Michail Sermon Primary sclerosing cholangitis 07/29/2022: Total  lap  colectomy, ileostomy. Osteoporosis: d/t steroids, saw endocrine>> no Rx unless has a fx or needs ongoing steroids Allergies, B-spasm (saw allergist remotely, was rx qvar) Frequent PVCs: Echo 2020 normal, saw cardiology, event monitor 28.8% PVCs.  Rx observation unless symptoms Admitted 08-2021: GI bleed, 2 PRBCs, left leg DVT  PLAN Crohn's disease. Status post laparoscopic total colectomy, ileostomy in place.  Patient was told that the next step would be to connect the ileum to the rectum. Seems to be doing well without surgical complications. To see surgery for follow-up in 3 days. Check CMP and  CBC. Lialda and Avsola were stopped.  On prednisone taper, to finish prednisone in a couple of days. Cough: After surgery have developed cough, exam is benign, recommend incentive respirometer, albuterol as needed and Robitussin.  See AVS. Flu shot: Declined for now, we agreed to proceed next week. RTC 6 months   Here for CPX Crohn's disease: active disease  for a while, on biologicals and  prednisone 40 mg. GI note 11-2021 reviewed >>> was felt  not to be in complete remission. Labs 11-2021: Normal CMP, hemoglobin 8.7.  Platelet 501, WBCs 8.0. Labs 06/13/2022: Hemoglobin 7.1, he is a scheduled for a transfusion in 3 days. He has been referred to local GI to [facility]. Refill iron Osteoporosis: Previous seen by Endo, was recommended Rx if he needs ongoing steroids.  Has been on steroid for a while, current dose is prednisone 40 mg. Plan: DEXA, start Fosamax, precautions discussed. RTC 6 months

## 2022-08-18 NOTE — Patient Instructions (Addendum)
Continue incentive spirometry  Use albuterol 2 puffs every 6 hours if cough, wheezing  Robitussin-DM or Robitussin (Mucinex or Mucinex DM) as needed.   GO TO THE LAB : Get the blood work     GO TO THE FRONT DESK, [place] back for  a follow up in 6 months

## 2022-08-19 ENCOUNTER — Other Ambulatory Visit (INDEPENDENT_AMBULATORY_CARE_PROVIDER_SITE_OTHER): Payer: BC Managed Care – PPO

## 2022-08-19 DIAGNOSIS — R739 Hyperglycemia, unspecified: Secondary | ICD-10-CM | POA: Diagnosis not present

## 2022-08-19 LAB — HEMOGLOBIN A1C: Hgb A1c MFr Bld: 5.2 % (ref 4.6–6.5)

## 2022-08-19 NOTE — Assessment & Plan Note (Signed)
Crohn's disease. Status post laparoscopic total colectomy, ileostomy in place.  Patient was told that the next step would be to connect the ileum to the rectum in few months. Seems to be doing well without surgical complications. To see surgery for follow-up in 3 days. Check CMP and CBC. Lialda and Avsola were stopped.  On prednisone taper, to finish prednisone in a couple of days. Cough: After surgery have developed cough, exam is benign, recommend incentive respirometer, albuterol as needed and Robitussin.  See AVS. Hyperglycemia: Check A1c Flu shot: Declined for now, we agreed to proceed next week. RTC 6 months

## 2022-08-21 DIAGNOSIS — Z09 Encounter for follow-up examination after completed treatment for conditions other than malignant neoplasm: Secondary | ICD-10-CM | POA: Diagnosis not present

## 2022-08-21 DIAGNOSIS — K51919 Ulcerative colitis, unspecified with unspecified complications: Secondary | ICD-10-CM | POA: Diagnosis not present

## 2022-08-26 ENCOUNTER — Ambulatory Visit (INDEPENDENT_AMBULATORY_CARE_PROVIDER_SITE_OTHER): Payer: BC Managed Care – PPO | Admitting: Internal Medicine

## 2022-08-26 ENCOUNTER — Encounter: Payer: Self-pay | Admitting: Internal Medicine

## 2022-08-26 ENCOUNTER — Ambulatory Visit (HOSPITAL_BASED_OUTPATIENT_CLINIC_OR_DEPARTMENT_OTHER)
Admission: RE | Admit: 2022-08-26 | Discharge: 2022-08-26 | Disposition: A | Discharge status: Home / Self Care | Payer: BC Managed Care – PPO | Source: Ambulatory Visit | Attending: Internal Medicine | Admitting: Internal Medicine

## 2022-08-26 VITALS — BP 126/68 | HR 99 | Temp 98.3°F | Resp 18 | Ht 66.0 in | Wt 171.1 lb

## 2022-08-26 DIAGNOSIS — R059 Cough, unspecified: Secondary | ICD-10-CM

## 2022-08-26 MED ORDER — DOXYCYCLINE HYCLATE 100 MG PO TABS: 100.0000 mg | ORAL_TABLET | Freq: Two times a day (BID) | ORAL | 0 refills | Status: DC

## 2022-08-26 NOTE — Progress Notes (Unsigned)
Subjective:    Patient ID: Reginald Parker, male    DOB: [birth year], 46 y.o.   MRN: [id number]  DOS:  08/26/2022 Type of visit - description: Acute  Had a total colectomy and ileostomy about a month ago. Since then is having some cough. Denies fever chills Early in the mornings he has yellow and at times greenish sputum. Denies difficulty swallowing or hoarseness No wheezing No GERD symptoms His children have been sick with bronchitis. No chest pain no difficulty breathing. No abdominal pain  Review of Systems See above   Past Medical History:  Diagnosis Date   Crohn's disease ([place])    universal colitis   Elevated liver function tests    Osteoporosis 06/01/2015   Premature ventricular contractions    Primary sclerosing cholangitis    Seasonal allergies     Past Surgical History:  Procedure Laterality Date   LAPAROSCOPIC TOTAL ABDOMINAL COLECTOMY  07/29/2022   w/ end ileostomy   NASAL SINUS SURGERY  11/24/2010    Current Outpatient Medications  Medication Instructions   albuterol (VENTOLIN HFA) 108 (90 Base) MCG/ACT inhaler 2 puffs, Inhalation, Every 6 hours PRN   alendronate (FOSAMAX) 70 mg, Oral, Every 7 days, Take with a full glass of water on an empty stomach.   cetirizine (ZYRTEC) 10 mg, Oral, Daily   iron polysaccharides (NIFEREX) 150 MG capsule TAKE 1 CAPSULE BY MOUTH EVERY DAY   NON FORMULARY [facility]<BR><BR>Antifungal- (nail)-#1<BR><BR>Dr Wagoner/Lisa   predniSONE (DELTASONE) 40 mg, Oral, Daily with breakfast   ursodiol (ACTIGALL) 500 mg, Oral, 2 times daily   Vitamin D (Ergocalciferol) (DRISDOL) 50,000 Units, Oral, Every 7 days, For 12 weeks       Objective:   Physical Exam BP 126/68   Pulse 99   Temp 98.3 F (36.8 C) (Oral)   Resp 18   Ht 5' 6"  (1.676 m)   Wt 171 lb 2 oz (77.6 kg)   SpO2 94%   BMI 27.62 kg/m  General:   Well developed, NAD, BMI noted. HEENT:  Normocephalic . Face symmetric, atraumatic. TMs normal Throat  symmetric not red Lungs:  Few crackles at the L bases?  Otherwise clear. Normal respiratory effort, no intercostal retractions, no accessory muscle use. Heart: RRR,  no murmur.  Lower extremities: no pretibial edema bilaterally  Skin: Not pale. Not jaundice Neurologic:  alert & oriented X3.  Speech normal, gait appropriate for age and unassisted Psych--  Cognition and judgment appear intact.  Cooperative with normal attention span and concentration.  Behavior appropriate. No anxious or depressed appearing.      Assessment    Assessment Hyperglycemia (on prednisone) GI: --Crohn's disease, universal colitis, cscope 09-2014, Dr Michail Sermon --Primary sclerosing cholangitis --07/29/2022: Total  lap  colectomy, ileostomy. Osteoporosis: d/t steroids, saw endocrine>> no Rx unless has a fx or needs ongoing steroids Allergies, B-spasm (saw allergist remotely, was rx qvar) Frequent PVCs: Echo 2020 normal, saw cardiology, event monitor 28.8% PVCs.  Rx observation unless symptoms Admitted 08-2021: GI bleed, 2 PRBCs, left leg DVT  PLAN Cough: Ongoing cough since surgery last month.  VSS, no distress, question of crackles at the left base. Plan: Chest x-ray, r/o pneumonia.  Start doxycycline.  Robitussin-DM.  Call if not gradually better.    9-25 Crohn's disease. Status post laparoscopic total colectomy, ileostomy in place.  Patient was told that the next step would be to connect the ileum to the rectum in few months. Seems to be doing well without surgical complications. To see surgery for  follow-up in 3 days. Check CMP and CBC. Lialda and Avsola were stopped.  On prednisone taper, to finish prednisone in a couple of days. Cough: After surgery have developed cough, exam is benign, recommend incentive respirometer, albuterol as needed and Robitussin.  See AVS. Hyperglycemia: Check A1c Flu shot: Declined for now, we agreed to proceed next week. RTC 6 months

## 2022-08-26 NOTE — Patient Instructions (Signed)
Get a chest x-ray today  Start the antibiotic called doxycycline  Take over-the-counter cough medicine such as Robitussin-DM or Mucinex DM.  Call if not gradually better in the next few days

## 2022-08-27 NOTE — Assessment & Plan Note (Signed)
Cough: Ongoing cough since surgery last month.  VSS, no distress, question of crackles at the left base. Plan: Chest x-ray, r/o pneumonia.  Start doxycycline.  Robitussin-DM.  Call if not gradually better.

## 2022-09-03 DIAGNOSIS — Z932 Ileostomy status: Secondary | ICD-10-CM | POA: Diagnosis not present

## 2022-09-04 ENCOUNTER — Other Ambulatory Visit: Payer: Self-pay | Admitting: Internal Medicine

## 2022-09-11 DIAGNOSIS — Z932 Ileostomy status: Secondary | ICD-10-CM | POA: Diagnosis not present

## 2022-10-03 DIAGNOSIS — Z932 Ileostomy status: Secondary | ICD-10-CM | POA: Diagnosis not present

## 2022-10-15 DIAGNOSIS — J029 Acute pharyngitis, unspecified: Secondary | ICD-10-CM | POA: Diagnosis not present

## 2022-10-15 DIAGNOSIS — R059 Cough, unspecified: Secondary | ICD-10-CM | POA: Diagnosis not present

## 2022-10-15 DIAGNOSIS — R0982 Postnasal drip: Secondary | ICD-10-CM | POA: Diagnosis not present

## 2022-10-15 DIAGNOSIS — Z20822 Contact with and (suspected) exposure to covid-19: Secondary | ICD-10-CM | POA: Diagnosis not present

## 2022-10-23 DIAGNOSIS — K51919 Ulcerative colitis, unspecified with unspecified complications: Secondary | ICD-10-CM | POA: Diagnosis not present

## 2022-11-03 DIAGNOSIS — K5100 Ulcerative (chronic) pancolitis without complications: Secondary | ICD-10-CM | POA: Diagnosis not present

## 2022-11-03 DIAGNOSIS — K8301 Primary sclerosing cholangitis: Secondary | ICD-10-CM | POA: Diagnosis not present

## 2022-11-04 DIAGNOSIS — Z932 Ileostomy status: Secondary | ICD-10-CM | POA: Diagnosis not present

## 2022-11-05 DIAGNOSIS — Z932 Ileostomy status: Secondary | ICD-10-CM | POA: Diagnosis not present

## 2022-11-11 ENCOUNTER — Encounter: Payer: Self-pay | Admitting: Podiatry

## 2022-11-11 NOTE — Progress Notes (Unsigned)
Completed paperwork to refill onychomycosis cream from [place].

## 2022-11-25 DIAGNOSIS — Z20822 Contact with and (suspected) exposure to covid-19: Secondary | ICD-10-CM | POA: Diagnosis not present

## 2022-11-25 DIAGNOSIS — R070 Pain in throat: Secondary | ICD-10-CM | POA: Diagnosis not present

## 2022-11-25 DIAGNOSIS — J019 Acute sinusitis, unspecified: Secondary | ICD-10-CM | POA: Diagnosis not present

## 2022-11-27 ENCOUNTER — Ambulatory Visit: Payer: BC Managed Care – PPO | Admitting: Internal Medicine

## 2022-11-27 DIAGNOSIS — K51919 Ulcerative colitis, unspecified with unspecified complications: Secondary | ICD-10-CM | POA: Diagnosis not present

## 2022-12-09 DIAGNOSIS — Z932 Ileostomy status: Secondary | ICD-10-CM | POA: Diagnosis not present

## 2022-12-11 DIAGNOSIS — Z932 Ileostomy status: Secondary | ICD-10-CM | POA: Diagnosis not present

## 2022-12-18 ENCOUNTER — Ambulatory Visit: Payer: BC Managed Care – PPO | Admitting: Internal Medicine

## 2023-01-06 DIAGNOSIS — G8918 Other acute postprocedural pain: Secondary | ICD-10-CM | POA: Diagnosis not present

## 2023-01-06 DIAGNOSIS — K660 Peritoneal adhesions (postprocedural) (postinfection): Secondary | ICD-10-CM | POA: Diagnosis not present

## 2023-01-06 DIAGNOSIS — K5190 Ulcerative colitis, unspecified, without complications: Secondary | ICD-10-CM | POA: Diagnosis not present

## 2023-01-06 DIAGNOSIS — K51919 Ulcerative colitis, unspecified with unspecified complications: Secondary | ICD-10-CM | POA: Diagnosis not present

## 2023-01-09 HISTORY — PX: PROCTECTOMY W/ CREATION OF COLON RESERVOIR: SUR1050

## 2023-01-13 ENCOUNTER — Encounter: Payer: Self-pay | Admitting: *Deleted

## 2023-01-13 ENCOUNTER — Telehealth: Payer: Self-pay | Admitting: *Deleted

## 2023-01-13 NOTE — Transitions of Care (Post Inpatient/ED Visit) (Signed)
   01/13/2023  Name: Reginald Parker MRN: [id number] DOB: Jul 24, 1976  Today's TOC FU Call Status: Today's TOC FU Call Status:: Successful TOC FU Call Competed TOC FU Call Complete Date: 01/13/23  Transition Care Management Follow-up Telephone Call Date of Discharge: 01/12/23 Discharge Facility: Other ([place]) Name of Other (Non-[facility]) Discharge Facility: [facility] Type of Discharge: Inpatient Admission Primary Inpatient Discharge Diagnosis:: ulcerative colitis with proctectomy and ileostomy How have you been since you were released from the hospital?: Better Any questions or concerns?: No  Items Reviewed: Did you receive and understand the discharge instructions provided?: Yes (thoroughly reviewed with patient today) Medications obtained and verified?: Yes (Medications Reviewed) (Full medication review completed; med list updated according to patient report; self-manages medications; no discrepancies or concerns noted during review; pt. denies questions/ concerns around medications; confirms obtained/ taking all newly Rx'd meds) Any new allergies since your discharge?: No Dietary orders reviewed?: Yes Type of Diet Ordered:: "Mild" diet Do you have support at home?: Yes People in Home: spouse Name of Support/Comfort Primary Source: "My wife"  Home Care and Equipment/Supplies: [place] Ordered?: No Any new equipment or medical supplies ordered?: No  Functional Questionnaire: Do you need assistance with bathing/showering or dressing?: No Do you need assistance with meal preparation?: No Do you need assistance with eating?: No Do you have difficulty maintaining continence: No Do you need assistance with getting out of bed/getting out of a chair/moving?: No Do you have difficulty managing or taking your medications?: No  Folllow up appointments reviewed: PCP Follow-up appointment confirmed?: Yes Date of PCP follow-up appointment?: 01/14/23 (care  coordination outreach successfully completed with scheduling care guide: hospital follow up PCP appointment scheduled for 01/14/23 at 11:00 am) Follow-up Provider: PCP, Dr. Larose [place] [facility] Follow-up appointment confirmed?: Yes Date of Specialist follow-up appointment?: 01/22/23 Follow-Up Specialty Provider:: Surgeon at [facility], Dr. Gwynneth Macleod Do you need transportation to your follow-up appointment?: No Do you understand care options if your condition(s) worsen?: Yes-patient verbalized understanding  SDOH Interventions Today    Flowsheet Row Most Recent Value  SDOH Interventions   Food Insecurity Interventions Intervention Not Indicated  Transportation Interventions Intervention Not Indicated  [drives self]      TOC Interventions Today    Flowsheet Row Most Recent Value  TOC Interventions   TOC Interventions Discussed/Reviewed Arranged PCP follow up within 7 days/Care Guide scheduled, TOC Interventions Discussed  [care coordination outreach successfully completed with scheduling care guide: hospital follow up PCP appointment scheduled for 01/14/23 at 11:00 am,  provided my direct phone number should questions/ concerns/ needs arise post-TOC call today]      Interventions Today    Flowsheet Row Most Recent Value  Chronic Disease   Chronic disease during today's visit Other  [ulcerative colitis]  General Interventions   General Interventions Discussed/Reviewed General Interventions Discussed, Doctor Visits  Doctor Visits Discussed/Reviewed Doctor Visits Discussed, PCP, Specialist  PCP/Specialist Visits Compliance with follow-up visit  Nutrition Interventions   Nutrition Discussed/Reviewed Nutrition Discussed  Pharmacy Interventions   Pharmacy Dicussed/Reviewed Pharmacy Topics Discussed  [Full medication review completed]      Reginald Rack, Reginald Parker, BSN, CCRN Alumnus Reginald Parker CM Care Coordination/ Transition of [place] Management [phone]: direct office

## 2023-01-13 NOTE — Progress Notes (Signed)
  Care Coordination  Note  01/13/2023 Name: Shamone Lucatero MRN: [id number] DOB: 05/12/1976  Nichlaus Lockaby is a 47 y.o. year old primary care patient of Colon Branch, MD.   Follow up plan: Hospital Follow Up appointment scheduled with Colon Branch, MD, on 01/14/23 at 11:00 am.   [place]  Direct Dial: [phone]

## 2023-01-14 ENCOUNTER — Ambulatory Visit (INDEPENDENT_AMBULATORY_CARE_PROVIDER_SITE_OTHER): Payer: BC Managed Care – PPO | Admitting: Internal Medicine

## 2023-01-14 ENCOUNTER — Encounter: Payer: Self-pay | Admitting: Internal Medicine

## 2023-01-14 VITALS — BP 118/84 | HR 82 | Temp 98.9°F | Resp 16 | Ht 66.0 in | Wt 164.1 lb

## 2023-01-14 DIAGNOSIS — K50919 Crohn's disease, unspecified, with unspecified complications: Secondary | ICD-10-CM

## 2023-01-14 DIAGNOSIS — M818 Other osteoporosis without current pathological fracture: Secondary | ICD-10-CM

## 2023-01-14 DIAGNOSIS — E559 Vitamin D deficiency, unspecified: Secondary | ICD-10-CM

## 2023-01-14 NOTE — Patient Instructions (Signed)
I am glad you feel well  Continue taking thyroid supplements on vitamin D at least 2000 units daily    [place], [place] back for a physical exam by July 2024

## 2023-01-14 NOTE — Progress Notes (Signed)
Subjective:    Patient ID: Reginald Parker, male    DOB: 1976/05/11, 47 y.o.   MRN: [id number]  DOS:  01/14/2023 Type of visit - description: Follow-up  Admitted to the hospital and discharge 01/12/23 after 6 days. Admitted electively for: 1. Laparoscopic enterolysis greater than 90 minutes 2. Robotic XI proctectomy with ileal pouch anal anastomosis creation 3. Loop ileostomy creation   He reported the procedure went well, initially had high ostomy output but that subsequently slowed w/  Imodium. Saw the ostomy nurse specialist.  At this point other than postop pain she is doing well. Denies fever or chills No nausea vomiting No orthostatic dizziness.  Appetite is good. No chest pain or calf pain  Review of Systems See above   Past Medical History:  Diagnosis Date   Crohn's disease ([place])    universal colitis   Elevated liver function tests    Osteoporosis 06/01/2015   Premature ventricular contractions    Primary sclerosing cholangitis    Seasonal allergies     Past Surgical History:  Procedure Laterality Date   LAPAROSCOPIC TOTAL ABDOMINAL COLECTOMY  07/29/2022   w/ end ileostomy   NASAL SINUS SURGERY  11/24/2010   PROCTECTOMY W/ CREATION OF COLON RESERVOIR  01/09/2023    Current Outpatient Medications  Medication Instructions   albuterol (VENTOLIN HFA) 108 (90 Base) MCG/ACT inhaler 2 puffs, Inhalation, Every 6 hours PRN   alendronate (FOSAMAX) 70 mg, Oral, Every 7 days, Take with a full glass of water on an empty stomach.   cetirizine (ZYRTEC) 10 mg, Oral, Daily   doxycycline (VIBRA-TABS) 100 mg, Oral, 2 times daily   enoxaparin (LOVENOX) 40 mg, Subcutaneous, Every 24 hours   iron polysaccharides (NIFEREX) 150 MG capsule TAKE 1 CAPSULE BY MOUTH EVERY DAY   loperamide (IMODIUM) 2 mg, Oral, 3 times daily   NON FORMULARY [facility]  Antifungal- (nail)-#1  Dr Lawernce Pitts   oxyCODONE (OXY IR/ROXICODONE) 5 mg, Every 6 hours PRN   predniSONE (DELTASONE)  40 mg, Oral, Daily with breakfast   ursodiol (ACTIGALL) 500 mg, Oral, 2 times daily   Vitamin D (Ergocalciferol) (DRISDOL) 50,000 Units, Oral, Every 7 days, For 12 weeks       Objective:   Physical Exam BP 118/84   Pulse 82   Temp 98.9 F (37.2 C) (Oral)   Resp 16   Ht 5' 6"$  (1.676 m)   Wt 164 lb 2 oz (74.4 kg)   SpO2 99%   BMI 26.49 kg/m  General:   Well developed, NAD, BMI noted. HEENT:  Normocephalic . Face symmetric, atraumatic Lungs:  CTA B Normal respiratory effort, no intercostal retractions, no accessory muscle use. Heart: RRR,  no murmur.  Lower extremities: no pretibial edema bilaterally.  Calves soft and symmetric Skin: Not pale. Not jaundice Neurologic:  alert & oriented X3.  Speech normal, gait appropriate for age and unassisted Psych--  Cognition and judgment appear intact.  Cooperative with normal attention span and concentration.  Behavior appropriate. No anxious or depressed appearing.      Assessment     Assessment Hyperglycemia (on prednisone) GI: --Crohn's disease, universal colitis, cscope 09-2014, Dr Michail Sermon --Primary sclerosing cholangitis --07/29/2022: Total  lap  colectomy, ileostomy. -- 12-2022 1. Laparoscopic enterolysis greater than 90 minutes 2. Robotic XI proctectomy with ileal pouch anal anastomosis creation 3. Loop ileostomy creation  Osteoporosis: d/t steroids, saw endocrine>> no Rx unless has a fx or needs ongoing steroids Allergies, B-spasm (saw allergist remotely, was rx qvar) Frequent PVCs:  Echo 2020 normal, saw cardiology, event monitor 28.8% PVCs.  Rx observation unless symptoms Admitted 08-2021: GI bleed, 2 PRBCs, left leg DVT  PLAN Most recent labs: 01/12/2023: Creatinine 0.6, magnesium and phosphorus normal 01/10/2023: CBC showed hemoglobin of 11.7 Crohn's disease: Previously had a total colectomy end ileostomy, had a second procedure few days ago consistent proctectomy and loop ileostomy creation. The loop ileostomy  is not in use, he will have another procedure in the future when the ileum will be connected to the loop ileostomy and hopefully he will not have to use the external ileostomy. He seems to be doing well clinically, continue present care, was recommended Lovenox for 3 weeks after the surgery.  No apparent complications. Vitamin D deficiency: Patient inquired about this issue, had ergocalciferol, recommend to OTC 2000 units daily Osteoporosis: Continue with Fosamax Primary sclerosing cholangitis: On ursodiol. No need for blood work today. RTC 05-2019 for CPX.

## 2023-01-14 NOTE — Assessment & Plan Note (Signed)
Most recent labs: 01/12/2023: Creatinine 0.6, magnesium and phosphorus normal 01/10/2023: CBC showed hemoglobin of 11.7 Crohn's disease: Previously had a total colectomy end ileostomy, had a second procedure few days ago consistent proctectomy and loop ileostomy creation. The loop ileostomy is not in use, he will have another procedure in the future when the ileum will be connected to the loop ileostomy and hopefully he will not have to use the external ileostomy. He seems to be doing well clinically, continue present care, was recommended Lovenox for 3 weeks after the surgery.  No apparent complications. Vitamin D deficiency: Patient inquired about this issue, had ergocalciferol, recommend to OTC 2000 units daily Osteoporosis: Continue with Fosamax Primary sclerosing cholangitis: On ursodiol. No need for blood work today. RTC 05-2019 for CPX.

## 2023-01-22 DIAGNOSIS — Z932 Ileostomy status: Secondary | ICD-10-CM | POA: Diagnosis not present

## 2023-01-22 DIAGNOSIS — Z09 Encounter for follow-up examination after completed treatment for conditions other than malignant neoplasm: Secondary | ICD-10-CM | POA: Diagnosis not present

## 2023-02-05 DIAGNOSIS — K51919 Ulcerative colitis, unspecified with unspecified complications: Secondary | ICD-10-CM | POA: Diagnosis not present

## 2023-02-05 DIAGNOSIS — Z932 Ileostomy status: Secondary | ICD-10-CM | POA: Diagnosis not present

## 2023-02-16 ENCOUNTER — Ambulatory Visit: Payer: BC Managed Care – PPO | Admitting: Internal Medicine

## 2023-03-06 DIAGNOSIS — K51919 Ulcerative colitis, unspecified with unspecified complications: Secondary | ICD-10-CM | POA: Diagnosis not present

## 2023-03-06 DIAGNOSIS — Z932 Ileostomy status: Secondary | ICD-10-CM | POA: Diagnosis not present

## 2023-03-12 DIAGNOSIS — K51919 Ulcerative colitis, unspecified with unspecified complications: Secondary | ICD-10-CM | POA: Diagnosis not present

## 2023-03-12 DIAGNOSIS — Z932 Ileostomy status: Secondary | ICD-10-CM | POA: Diagnosis not present

## 2023-03-20 DIAGNOSIS — Z79899 Other long term (current) drug therapy: Secondary | ICD-10-CM | POA: Diagnosis not present

## 2023-03-20 DIAGNOSIS — Z7951 Long term (current) use of inhaled steroids: Secondary | ICD-10-CM | POA: Diagnosis not present

## 2023-03-20 DIAGNOSIS — Z794 Long term (current) use of insulin: Secondary | ICD-10-CM | POA: Diagnosis not present

## 2023-03-20 DIAGNOSIS — Z86718 Personal history of other venous thrombosis and embolism: Secondary | ICD-10-CM | POA: Diagnosis not present

## 2023-03-20 DIAGNOSIS — K51919 Ulcerative colitis, unspecified with unspecified complications: Secondary | ICD-10-CM | POA: Diagnosis not present

## 2023-04-03 DIAGNOSIS — K8301 Primary sclerosing cholangitis: Secondary | ICD-10-CM | POA: Diagnosis not present

## 2023-04-07 DIAGNOSIS — K51919 Ulcerative colitis, unspecified with unspecified complications: Secondary | ICD-10-CM | POA: Diagnosis not present

## 2023-04-07 DIAGNOSIS — Z86718 Personal history of other venous thrombosis and embolism: Secondary | ICD-10-CM | POA: Diagnosis not present

## 2023-04-07 DIAGNOSIS — Z432 Encounter for attention to ileostomy: Secondary | ICD-10-CM | POA: Diagnosis not present

## 2023-04-07 DIAGNOSIS — K565 Intestinal adhesions [bands], unspecified as to partial versus complete obstruction: Secondary | ICD-10-CM | POA: Diagnosis not present

## 2023-04-07 DIAGNOSIS — Z9049 Acquired absence of other specified parts of digestive tract: Secondary | ICD-10-CM | POA: Diagnosis not present

## 2023-04-07 DIAGNOSIS — Z939 Artificial opening status, unspecified: Secondary | ICD-10-CM | POA: Diagnosis not present

## 2023-04-07 DIAGNOSIS — Z932 Ileostomy status: Secondary | ICD-10-CM | POA: Diagnosis not present

## 2023-04-07 DIAGNOSIS — G8918 Other acute postprocedural pain: Secondary | ICD-10-CM | POA: Diagnosis not present

## 2023-04-07 DIAGNOSIS — Z7901 Long term (current) use of anticoagulants: Secondary | ICD-10-CM | POA: Diagnosis not present

## 2023-04-13 ENCOUNTER — Encounter: Payer: Self-pay | Admitting: *Deleted

## 2023-04-13 ENCOUNTER — Telehealth: Payer: Self-pay | Admitting: *Deleted

## 2023-04-13 NOTE — Transitions of Care (Post Inpatient/ED Visit) (Signed)
04/13/2023  Name: Reginald Parker MRN: [id number] DOB: [birth year]  Today's TOC FU Call Status: Today's TOC FU Call Status:: Successful TOC FU Call Competed TOC FU Call Complete Date: 04/13/23  Transition Care Management Follow-up Telephone Call Date of Discharge: 04/10/23 Discharge Facility: Other ([facility] Facility) Name of Other (Non[facility]) Discharge Facility: [facility] Type of Discharge: Inpatient Admission Primary Inpatient Discharge Diagnosis:: ulcerative colitis; reversal- closure of surgical ileostomy How have you been since you were released from the hospital?: Better ("I am doing much better after this surgery, not in much pain at all.  Eating good and I have an appointment with the surgery.  They did not tell me to follow up with Dr. Drue Novel but I will take your number in case I have any problems that come up") Any questions or concerns?: No  Items Reviewed: Did you receive and understand the discharge instructions provided?: Yes (briefly reviewed with patient who verbalizes good understanding of same - outside AVS) Medications obtained,verified, and reconciled?: Yes (Medications Reviewed) (Full medication reconciliation/ review completed; no concerns or discrepancies identified; confirmed patient obtained/ is taking all newly Rx'd medications as instructed; self-manages medications and denies questions/ concerns around medications today) Any new allergies since your discharge?: No Dietary orders reviewed?: Yes Type of Diet Ordered:: "easy and conservative diet" after recent surgery- progress slowly Do you have support at home?: Yes People in Home: spouse Name of Support/Comfort Primary Source: Reports independent in self-care activities; supportive spouse assists as/ if needed/ indicated  Medications Reviewed Today: Medications Reviewed Today     Reviewed by Michaela Corner, RN (Registered Nurse) on 04/13/23 at 1059  Med List Status: <None>   Medication Order Taking? Sig  Documenting Provider Last Dose Status Informant  albuterol (VENTOLIN HFA) 108 (90 Base) MCG/ACT inhaler [id number] Yes Inhale 2 puffs into the lungs every 6 (six) hours as needed for wheezing or shortness of breath. Wanda Plump, MD Taking Active            Med Note Michaela Corner   Mon Apr 13, 2023 10:59 AM) 04/13/23- reports has not needed recently  alendronate (FOSAMAX) 70 MG tablet [id number] Yes Take 1 tablet (70 mg total) by mouth every 7 (seven) days. Take with a full glass of water on an empty stomach. Wanda Plump, MD Taking Active   cetirizine (ZYRTEC) 10 MG tablet [id number] Yes Take 10 mg by mouth daily. [provider] Taking Active Self  Cholecalciferol (VITAMIN D) 50 MCG (2000 UT) CAPS [id number] Yes Take by mouth. [provider] Taking Active   enoxaparin (LOVENOX) 40 MG/0.4ML injection [id number] No Inject 40 mg into the skin daily.  Patient not taking: Reported on 04/13/2023   Wanda Plump, MD Not Taking Active Self           Med Note Marilu Favre Apr 13, 2023 10:57 AM) 04/13/23- reports no longer taking; was stopped at time of recent surgery 04/07/23- was told does not need to take after most recent surgery   fluticasone (FLONASE) 50 MCG/ACT nasal spray [id number] Yes Place 1 spray into both nostrils daily. Wanda Plump, MD Taking Active Self  iron polysaccharides (NIFEREX) 150 MG capsule [id number] No TAKE 1 CAPSULE BY MOUTH EVERY DAY  Patient not taking: Reported on 01/13/2023   Wanda Plump, MD Not Taking Active   loperamide (IMODIUM) 2 MG capsule [id number] Yes Take 2 mg by mouth 3 (three) times daily. Wanda Plump, MD Taking Active Self  Med Note Michaela Corner   Mon Apr 13, 2023 10:59 AM) 04/13/23- reports has not needed recently  NON FORMULARY [id number] Yes [facility]  Antifungal- (nail)-#1  Dr Janace Hoard [provider] Taking Active   oxyCODONE (OXY IR/ROXICODONE) 5 MG immediate release tablet [id number] No Take 5 mg by  mouth every 6 (six) hours as needed for severe pain.  Patient not taking: Reported on 01/14/2023   Wanda Plump, MD Not Taking Active Self           Med Note Michaela Corner   Tue Jan 13, 2023 11:28 AM) 01/13/23: reports not needing to take very much post recent surgery at [facility]  pregabalin (LYRICA) 50 MG capsule [id number] Yes Take 50 mg by mouth 3 (three) times daily. Wanda Plump, MD Taking Active Self           Med Note Marilu Favre Apr 13, 2023 10:55 AM) 04/13/23- taking after surgery at [facility] on 04/07/23- for 10 days  ursodiol (ACTIGALL) 500 MG tablet [id number] Yes Take 500 mg by mouth 2 (two) times daily. [provider] Taking Active Self           Med Note Marilu Favre Apr 13, 2023 10:55 AM) 04/13/23- reports now taking QD             Home Care and Equipment/Supplies: Were Home Health Services Ordered?: No Any new equipment or medical supplies ordered?: No  Functional Questionnaire: Do you need assistance with bathing/showering or dressing?: No Do you need assistance with meal preparation?: No Do you need assistance with eating?: No Do you have difficulty maintaining continence: No Do you need assistance with getting out of bed/getting out of a chair/moving?: No Do you have difficulty managing or taking your medications?: No  Follow up appointments reviewed: PCP Follow-up appointment confirmed?: NA (verified not indicated per hospital discharging provider discharge notes) Specialist Hospital Follow-up appointment confirmed?: Yes Date of Specialist follow-up appointment?: 04/30/23 (verified this is recommended time frame for follow up per hospital discharging provider notes) Follow-Up Specialty Provider:: surgeon [facility] Do you need transportation to your follow-up appointment?: No Do you understand care options if your condition(s) worsen?: Yes-patient verbalized understanding  SDOH Interventions Today    Flowsheet Row Most Recent Value  SDOH  Interventions   Food Insecurity Interventions Intervention Not Indicated  Transportation Interventions Intervention Not Indicated  [normally drives self,  wife assists as indicated]      TOC Interventions Today    Flowsheet Row Most Recent Value  TOC Interventions   TOC Interventions Discussed/Reviewed TOC Interventions Discussed  [Patient declines need for ongoing/ further care coordination outreach,  no care coordination needs identified at time of TOC call today,  provided my direct contact information should questions/ concerns/ needs arise post-TOC call]      Interventions Today    Flowsheet Row Most Recent Value  Chronic Disease   Chronic disease during today's visit Other  [surgical ileostomy closure]  General Interventions   General Interventions Discussed/Reviewed General Interventions Discussed, Doctor Visits  Doctor Visits Discussed/Reviewed Specialist, PCP, Doctor Visits Discussed  PCP/Specialist Visits Compliance with follow-up visit  Nutrition Interventions   Nutrition Discussed/Reviewed Nutrition Discussed  Pharmacy Interventions   Pharmacy Dicussed/Reviewed Pharmacy Topics Discussed  [Full medication review with updating medication list in EHR per patient report]      Caryl Pina, RN, BSN, CCRN Alumnus RN CM Care Coordination/ [facility] (  [phone]: direct office

## 2023-04-30 DIAGNOSIS — Z09 Encounter for follow-up examination after completed treatment for conditions other than malignant neoplasm: Secondary | ICD-10-CM | POA: Diagnosis not present

## 2023-04-30 DIAGNOSIS — K51919 Ulcerative colitis, unspecified with unspecified complications: Secondary | ICD-10-CM | POA: Diagnosis not present

## 2023-06-04 DIAGNOSIS — K8301 Primary sclerosing cholangitis: Secondary | ICD-10-CM | POA: Diagnosis not present

## 2023-06-17 DIAGNOSIS — J019 Acute sinusitis, unspecified: Secondary | ICD-10-CM | POA: Diagnosis not present

## 2023-06-17 DIAGNOSIS — J029 Acute pharyngitis, unspecified: Secondary | ICD-10-CM | POA: Diagnosis not present

## 2023-06-17 DIAGNOSIS — R059 Cough, unspecified: Secondary | ICD-10-CM | POA: Diagnosis not present

## 2023-06-17 DIAGNOSIS — Z20822 Contact with and (suspected) exposure to covid-19: Secondary | ICD-10-CM | POA: Diagnosis not present

## 2023-06-22 DIAGNOSIS — K8301 Primary sclerosing cholangitis: Secondary | ICD-10-CM | POA: Diagnosis not present

## 2023-06-26 ENCOUNTER — Other Ambulatory Visit: Payer: Self-pay | Admitting: Gastroenterology

## 2023-06-26 DIAGNOSIS — R748 Abnormal levels of other serum enzymes: Secondary | ICD-10-CM

## 2023-06-26 DIAGNOSIS — K8301 Primary sclerosing cholangitis: Secondary | ICD-10-CM

## 2023-07-01 ENCOUNTER — Ambulatory Visit (INDEPENDENT_AMBULATORY_CARE_PROVIDER_SITE_OTHER): Payer: BC Managed Care – PPO | Admitting: Internal Medicine

## 2023-07-01 ENCOUNTER — Encounter: Payer: Self-pay | Admitting: Internal Medicine

## 2023-07-01 VITALS — BP 118/78 | HR 81 | Ht 66.0 in | Wt 149.4 lb

## 2023-07-01 DIAGNOSIS — K50919 Crohn's disease, unspecified, with unspecified complications: Secondary | ICD-10-CM | POA: Diagnosis not present

## 2023-07-01 DIAGNOSIS — Z0000 Encounter for general adult medical examination without abnormal findings: Secondary | ICD-10-CM

## 2023-07-01 DIAGNOSIS — M818 Other osteoporosis without current pathological fracture: Secondary | ICD-10-CM | POA: Diagnosis not present

## 2023-07-01 DIAGNOSIS — E559 Vitamin D deficiency, unspecified: Secondary | ICD-10-CM | POA: Diagnosis not present

## 2023-07-01 DIAGNOSIS — Z0001 Encounter for general adult medical examination with abnormal findings: Secondary | ICD-10-CM

## 2023-07-01 DIAGNOSIS — R634 Abnormal weight loss: Secondary | ICD-10-CM

## 2023-07-01 LAB — CBC WITH DIFFERENTIAL/PLATELET
Basophils Absolute: 0 10*3/uL (ref 0.0–0.1)
Basophils Relative: 0.5 % (ref 0.0–3.0)
Eosinophils Absolute: 0.6 10*3/uL (ref 0.0–0.7)
Eosinophils Relative: 8 % — ABNORMAL HIGH (ref 0.0–5.0)
HCT: 40.5 % (ref 39.0–52.0)
Hemoglobin: 13 g/dL (ref 13.0–17.0)
Lymphocytes Relative: 27.2 % (ref 12.0–46.0)
Lymphs Abs: 2.1 10*3/uL (ref 0.7–4.0)
MCHC: 32 g/dL (ref 30.0–36.0)
MCV: 85.8 fl (ref 78.0–100.0)
Monocytes Absolute: 0.9 10*3/uL (ref 0.1–1.0)
Monocytes Relative: 11 % (ref 3.0–12.0)
Neutro Abs: 4.2 10*3/uL (ref 1.4–7.7)
Neutrophils Relative %: 53.3 % (ref 43.0–77.0)
Platelets: 353 10*3/uL (ref 150.0–400.0)
RBC: 4.72 Mil/uL (ref 4.22–5.81)
RDW: 15.1 % (ref 11.5–15.5)
WBC: 7.9 10*3/uL (ref 4.0–10.5)

## 2023-07-01 LAB — LIPID PANEL
Cholesterol: 147 mg/dL (ref 0–200)
HDL: 40.9 mg/dL (ref >39.00)
LDL Cholesterol: 87 mg/dL (ref 0–99)
NonHDL: 106.2
Total CHOL/HDL Ratio: 4
Triglycerides: 96 mg/dL (ref 0.0–149.0)
VLDL: 19.2 mg/dL (ref 0.0–40.0)

## 2023-07-01 LAB — BASIC METABOLIC PANEL
BUN: 9 mg/dL (ref 6–23)
CO2: 28 mEq/L (ref 19–32)
Calcium: 8.9 mg/dL (ref 8.4–10.5)
Chloride: 101 mEq/L (ref 96–112)
Creatinine, Ser: 0.69 mg/dL (ref 0.40–1.50)
GFR: 110.07 mL/min (ref >60.00)
Glucose, Bld: 82 mg/dL (ref 70–99)
Potassium: 4.6 mEq/L (ref 3.5–5.1)
Sodium: 136 mEq/L (ref 135–145)

## 2023-07-01 LAB — TSH: TSH: 0.77 u[IU]/mL (ref 0.35–5.50)

## 2023-07-01 LAB — PSA: PSA: 2.53 ng/mL (ref 0.10–4.00)

## 2023-07-01 LAB — VITAMIN D 25 HYDROXY (VIT D DEFICIENCY, FRACTURES): VITD: 28.48 ng/mL — ABNORMAL LOW (ref 30.00–100.00)

## 2023-07-01 NOTE — Assessment & Plan Note (Signed)
Here for CPX Osteoporosis:  Due to chronic steroids, started Fosamax 05-2022. DEXA 07/08/2022: Z-score -4.2. Reassess Fosamax on RTC vs referral  Vitamin D deficiency: On vitamin D, 2000 units daily.  Checking labs Crohn's disease, primary sclerosis cholangitis: Recently saw GI, reportedly LFTs were drawn. Last visit with general surgery 04/30/2023, follow-up as needed Currently doing well, stools are soft but not bloody.  Had weight loss after multiple surgeries, for completeness we will check a TSH.  Reassess in 3 months. RTC 3 to 4 months

## 2023-07-01 NOTE — Patient Instructions (Signed)
-  Vaccines I recommend: COVID booster, flu shot  Vitamin D 2000 units every day.      GO TO THE LAB : Get the blood work     GO TO THE FRONT DESK, PLEASE SCHEDULE YOUR APPOINTMENTS Come back for a checkup in 3 to 4 months

## 2023-07-01 NOTE — Assessment & Plan Note (Signed)
Here for CPX - Td 2017   -PNM 23 2017 - PNM 20: 2023 ( immunosuppressed status) -Vaccines I recommend: COVID booster, flu shot  - Colon cancer screening:  per GI, last cscope  07-2021, was told no further cscope, s/p colectomy Prostate cancer screening: No symptoms, start checking PSAs today.   Diet and exercise: Counseled. Labs:   BMP FLP CBC vitamin D PSA TSH

## 2023-07-01 NOTE — Progress Notes (Signed)
Subjective:    Patient ID: Reginald Parker, male    DOB: 1976/01/24, 47 y.o.   MRN: [id number]  DOS:  07/01/2023 Type of visit - description: CPX  Here for CPX. Chronic medical problems addressed. In general feeling well. Weight loss noticed, after he had multiple surgeries.   Wt Readings from Last 3 Encounters:  07/01/23 149 lb 6.4 oz (67.8 kg)  01/14/23 164 lb 2 oz (74.4 kg)  08/26/22 171 lb 2 oz (77.6 kg)     Review of Systems  Other than above, a 14 point review of systems is negative     Past Medical History:  Diagnosis Date   Crohn's disease ([facility])    universal colitis   Elevated liver function tests    Osteoporosis 06/01/2015   Premature ventricular contractions    Primary sclerosing cholangitis    Seasonal allergies     Past Surgical History:  Procedure Laterality Date   LAPAROSCOPIC TOTAL ABDOMINAL COLECTOMY  07/29/2022   w/ end ileostomy   NASAL SINUS SURGERY  11/24/2010   PROCTECTOMY W/ CREATION OF COLON RESERVOIR  01/09/2023   Social History   Socioeconomic History   Marital status: Significant Other    Spouse name: Not on file   Number of children: 2   Years of education: Not on file   Highest education level: Not on file  Occupational History   Occupation: Curator  Tobacco Use   Smoking status: Never   Smokeless tobacco: Never  Vaping Use   Vaping status: Never Used  Substance and Sexual Activity   Alcohol use: Not Currently   Drug use: No   Sexual activity: Not on file  Other Topics Concern   Not on file  Social History Narrative   Original from [place]   Lives w/ girlfriend in [place] amd his two sons   Twins 2018   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)   Social Determinants of Corporate investment banker Strain: Low Risk  (07/30/2022)   Received from [facility], [facility]   Overall Financial Resource Strain (CARDIA)    Difficulty of Paying Living Expenses: Not hard at all  Food  Insecurity: No Food Insecurity (04/13/2023)   Hunger Vital Sign    Worried About Running Out of Food in the Last Year: Never true    Ran Out of Food in the Last Year: Never true  Transportation Needs: No Transportation Needs (04/13/2023)   PRAPARE - Administrator, Civil Service (Medical): No    Lack of Transportation (Non-Medical): No  Physical Activity: Inactive (07/30/2022)   Received [facility], [facility]   Exercise Vital Sign    Days of Exercise per Week: 0 days    Minutes of Exercise per Session: 0 min  Stress: No Stress Concern Present (07/30/2022)   Received from [facility], [facility] - Occupational Stress Questionnaire    Feeling of Stress : Not at all  Social Connections: Unknown (10/15/2022)   Received from [facility], [facility]   Social Network    Social Network: Not on file  Intimate Partner Violence: Unknown (10/15/2022)   Received from [facility], [facility]   HITS    Physically Hurt: Not on file    Insult or Talk Down To: Not on file    Threaten Physical Harm: Not on file    Scream or Curse: Not  on file    Current Outpatient Medications  Medication Instructions   albuterol (VENTOLIN HFA) 108 (90 Base) MCG/ACT inhaler 2 puffs, Inhalation, Every 6 hours PRN   alendronate (FOSAMAX) 70 mg, Oral, Every 7 days, Take with a full glass of water on an empty stomach.   cetirizine (ZYRTEC) 10 mg, Oral, Daily   Cholecalciferol (VITAMIN D) 50 MCG (2000 UT) CAPS Oral   fluticasone (FLONASE) 50 MCG/ACT nasal spray 1 spray, Each Nare, Daily   NON FORMULARY [facility]<BR><BR>Antifungal- (nail)-#1<BR><BR>Dr Wagoner/Lisa   ursodiol (ACTIGALL) 500 mg, Oral, 2 times daily       Objective:   Physical Exam BP 118/78   Pulse 81   Ht 5\' 6"  (1.676 m)   Wt 149 lb 6.4 oz (67.8 kg)   SpO2 98%   BMI 24.11 kg/m  General: Well developed, NAD, BMI noted Neck: No  thyromegaly  HEENT:   Normocephalic . Face symmetric, atraumatic Lungs:  CTA B Normal respiratory effort, no intercostal retractions, no accessory muscle use. Heart: RRR,  no murmur.  Abdomen:  Not distended, soft, well-healed surgical scars.  Minimal tenderness to palpation throughout. Lower extremities: no pretibial edema bilaterally  Skin: Exposed areas without rash. Not pale. Not jaundice Neurologic:  alert & oriented X3.  Speech normal, gait appropriate for age and unassisted Strength symmetric and appropriate for age.  Psych: Cognition and judgment appear intact.  Cooperative with normal attention span and concentration.  Behavior appropriate. No anxious or depressed appearing.     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 Patient underwent a loop ileostomy closure on 04/07/2023  Osteoporosis: d/t steroids, saw endocrine before  Allergies, B-spasm (saw allergist remotely, was rx qvar) Frequent PVCs: Echo 2020 normal, saw cardiology, event monitor 28.8% PVCs.  Rx observation unless symptoms Admitted 08-2021: GI bleed, 2 PRBCs, left leg DVT  PLAN Here for CPX - Td 2017   -PNM 23 2017 - PNM 20: 2023 ( immunosuppressed status) -Vaccines I recommend: COVID booster, flu shot  - Colon cancer screening:  per GI, last cscope  07-2021, was told no further cscope, s/p colectomy Prostate cancer screening: No symptoms, start checking PSAs today.   Diet and exercise: Counseled. Labs:   BMP FLP CBC vitamin D PSA TSH Osteoporosis:  Due to chronic steroids, started Fosamax 05-2022. DEXA 07/08/2022: Z-score -4.2. Reassess Fosamax on RTC vs referral  Vitamin D deficiency: On  vitamin D, 2000 units daily.  Checking labs Crohn's disease, primary sclerosis cholangitis: Recently saw GI, reportedly LFTs were drawn. Last visit with general surgery 04/30/2023, follow-up as needed Currently doing well, stools are soft but not bloody.  Had weight loss after multiple surgeries, for completeness we will check a TSH.  Reassess in 3 months. RTC 3 to 4 months

## 2023-07-03 MED ORDER — VITAMIN D (ERGOCALCIFEROL) 1.25 MG (50000 UNIT) PO CAPS
50000.0000 [IU] | ORAL_CAPSULE | ORAL | 2 refills | Status: DC
Start: 2023-07-03 — End: 2023-11-03

## 2023-07-03 NOTE — Addendum Note (Signed)
Addended by: Wilford Corner on: 07/03/2023 04:42 PM   Modules accepted: Orders

## 2023-07-28 ENCOUNTER — Encounter: Payer: Self-pay | Admitting: Gastroenterology

## 2023-08-15 ENCOUNTER — Ambulatory Visit
Admission: RE | Admit: 2023-08-15 | Discharge: 2023-08-15 | Disposition: A | Discharge status: Home / Self Care | Payer: BC Managed Care – PPO | Source: Ambulatory Visit | Attending: Gastroenterology | Admitting: Gastroenterology

## 2023-08-15 DIAGNOSIS — K769 Liver disease, unspecified: Secondary | ICD-10-CM | POA: Diagnosis not present

## 2023-08-15 DIAGNOSIS — K828 Other specified diseases of gallbladder: Secondary | ICD-10-CM | POA: Diagnosis not present

## 2023-08-15 DIAGNOSIS — R748 Abnormal levels of other serum enzymes: Secondary | ICD-10-CM

## 2023-08-15 DIAGNOSIS — K8301 Primary sclerosing cholangitis: Secondary | ICD-10-CM

## 2023-08-15 DIAGNOSIS — K8020 Calculus of gallbladder without cholecystitis without obstruction: Secondary | ICD-10-CM | POA: Diagnosis not present

## 2023-08-15 MED ORDER — GADOPICLENOL 0.5 MMOL/ML IV SOLN
7.5000 mL | Freq: Once | INTRAVENOUS | Status: AC | PRN
  Administered 2023-08-15: 7.5 mL via INTRAVENOUS

## 2023-08-18 ENCOUNTER — Encounter: Payer: Self-pay | Admitting: Physician Assistant

## 2023-08-18 ENCOUNTER — Ambulatory Visit (INDEPENDENT_AMBULATORY_CARE_PROVIDER_SITE_OTHER): Payer: BC Managed Care – PPO | Admitting: Physician Assistant

## 2023-08-18 VITALS — BP 128/66 | HR 69 | Temp 97.8°F | Resp 16 | Ht 66.0 in | Wt 165.5 lb

## 2023-08-18 DIAGNOSIS — R600 Localized edema: Secondary | ICD-10-CM

## 2023-08-18 DIAGNOSIS — R233 Spontaneous ecchymoses: Secondary | ICD-10-CM | POA: Diagnosis not present

## 2023-08-18 LAB — BASIC METABOLIC PANEL
BUN: 9 (ref 4–21)
CO2: 31 — AB (ref 13–22)
Chloride: 104 (ref 99–108)
Creatinine: 0.7 (ref 0.6–1.3)
Glucose: 104
Potassium: 4.3 meq/L (ref 3.5–5.1)
Sodium: 138 (ref 137–147)

## 2023-08-18 LAB — CBC AND DIFFERENTIAL
HCT: 39 — AB (ref 41–53)
Hemoglobin: 12.3 — AB (ref 13.5–17.5)
Neutrophils Absolute: 2.9
Platelets: 310 10*3/uL (ref 150–400)
WBC: 7.4

## 2023-08-18 LAB — MICROALBUMIN / CREATININE URINE RATIO
Creatinine,U: 171.8 mg/dL
Microalb Creat Ratio: 0.5 mg/g (ref 0.0–30.0)
Microalb, Ur: 0.9 mg/dL (ref 0.0–1.9)

## 2023-08-18 LAB — PROTIME-INR: Protime: 14.3 — AB (ref 10.0–13.8)

## 2023-08-18 LAB — HEPATIC FUNCTION PANEL
ALT: 40 U/L (ref 10–40)
AST: 32 (ref 14–40)
Alkaline Phosphatase: 179 — AB (ref 25–125)
Bilirubin, Total: 1.6

## 2023-08-18 LAB — COMPREHENSIVE METABOLIC PANEL
Albumin: 2.7 — AB (ref 3.5–5.0)
Calcium: 8.2 — AB (ref 8.7–10.7)
eGFR: 110.46

## 2023-08-18 LAB — POCT ERYTHROCYTE SEDIMENTATION RATE, NON-AUTOMATED: Sed Rate: 14

## 2023-08-18 LAB — POCT INR: INR: 1.4 — AB (ref 0.80–1.20)

## 2023-08-18 LAB — CBC: RBC: 4.43 (ref 3.87–5.11)

## 2023-08-18 NOTE — Progress Notes (Signed)
Established patient visit   Patient: Reginald Parker   DOB: Jan 21, 1976   47 y.o. Male  MRN: [id number] Visit Date: 08/18/2023  Today's healthcare provider: Alfredia Ferguson, PA-C   Cc. Edema , bruising x 1 week  Subjective    HPI  Pt reports for the last week intermittent swelling in his lower extremities, but the last few days it has persisted. Reports some tightness/pain. He used compression socks for a day, and when he took them off noticed significant bruising.  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 Reports pulling up a carpet that was old and difficult to remove.    Medications: Outpatient Medications Prior to Visit  Medication Sig   alendronate (FOSAMAX) 70 MG tablet Take 1 tablet (70 mg total) by mouth every 7 (seven) days. Take with a full glass of water on an empty stomach.   cetirizine (ZYRTEC) 10 MG tablet Take 10 mg by mouth daily.   Cholecalciferol (VITAMIN D) 50 MCG (2000 UT) CAPS Take by mouth.   fluticasone (FLONASE) 50 MCG/ACT nasal spray Place 1 spray into both nostrils daily.   NON FORMULARY [facility]  Antifungal- (nail)-#1  Dr Janace Hoard   ursodiol (ACTIGALL) 500 MG tablet Take 500 mg by mouth 2 (two) times daily.   Vitamin D, Ergocalciferol, (DRISDOL) 1.25 MG (50000 UNIT) CAPS capsule Take 1 capsule (50,000 Units total) by mouth every 7 (seven) days.   albuterol (VENTOLIN HFA) 108 (90 Base) MCG/ACT inhaler Inhale 2 puffs into the lungs every 6 (six) hours as needed for wheezing or shortness of breath. (Patient not taking: Reported on 08/18/2023)   No facility-administered medications prior to visit.    Review of Systems  Constitutional:  Negative for fatigue and fever.  Respiratory:  Negative for cough and shortness of breath.   Cardiovascular:  Positive for leg swelling. Negative for chest pain and palpitations.  Skin:  Positive for rash.  Neurological:  Negative  for dizziness and headaches.      Objective    BP 128/66   Pulse 69   Temp 97.8 F (36.6 C) (Oral)   Resp 16   Ht 5\' 6"  (1.676 m)   Wt 165 lb 8 oz (75.1 kg)   SpO2 98%   BMI 26.71 kg/m   Physical Exam Constitutional:      General: He is awake.     Appearance: He is well-developed.  HENT:     Head: Normocephalic.  Eyes:     Conjunctiva/sclera: Conjunctivae normal.  Cardiovascular:     Rate and Rhythm: Normal rate and regular rhythm.  Pulmonary:     Effort: Pulmonary effort is normal.     Breath sounds: Normal breath sounds.  Musculoskeletal:     Comments: 2+ pitting edema b/l LE  DP 2+ bilateral feet  Skin:    General: Skin is warm.     Comments: Non blanching petechiae b/l lower extremity. Only extends up to knee  Neurological:     Mental Status: He is alert and oriented to person, place, and time.  Psychiatric:        Attention and Perception: Attention normal.        Mood and Affect: Mood normal.        Speech: Speech normal.        Behavior: Behavior is cooperative.         No results found for any visits  on 08/18/23.  Assessment & Plan     1. Petechiae/purpura Order labs below, r/o bleeding d/o, vasculitis   - CBC w/Diff - PTT - Protime-INR - Sedimentation rate - C-reactive protein - Urine Microalbumin w/creat. ratio  2. Localized edema R/o kidney , heart etiology Recommending elevation, tall socks  - Comp Met (CMET) - B Nat Peptide - Urine Microalbumin w/creat. ratio   Return if symptoms worsen or fail to improve.      I, Alfredia Ferguson, PA-C have reviewed all documentation for this visit. The documentation on  08/18/23   for the exam, diagnosis, procedures, and orders are all accurate and complete.    Alfredia Ferguson, PA-C  [facility] [phone] (phone) [phone] (fax)  [facility]

## 2023-08-19 DIAGNOSIS — K8301 Primary sclerosing cholangitis: Secondary | ICD-10-CM | POA: Diagnosis not present

## 2023-08-20 ENCOUNTER — Telehealth: Payer: Self-pay | Admitting: Internal Medicine

## 2023-08-20 NOTE — Telephone Encounter (Signed)
Still waiting for multiple results to return.

## 2023-08-20 NOTE — Telephone Encounter (Signed)
Pt would like to go over labs.

## 2023-08-21 ENCOUNTER — Other Ambulatory Visit: Payer: Self-pay | Admitting: Physician Assistant

## 2023-08-21 DIAGNOSIS — I776 Arteritis, unspecified: Secondary | ICD-10-CM

## 2023-08-21 LAB — BRAIN NATRIURETIC PEPTIDE: B Natriuretic Peptide: 8

## 2023-08-21 LAB — C-REACTIVE PROTEIN: CRP: 1.2

## 2023-08-21 LAB — APTT: PTT: 34.2 (ref 25.8–37.1)

## 2023-08-21 MED ORDER — PREDNISONE 20 MG PO TABS
40.0000 mg | ORAL_TABLET | Freq: Every day | ORAL | 0 refills | Status: AC
Start: 2023-08-21 — End: 2023-08-26

## 2023-08-21 NOTE — Telephone Encounter (Signed)
Labs forwarded to [place]- please advise.

## 2023-08-21 NOTE — Telephone Encounter (Signed)
Lab results never crossed- [place] spoke w/ Ninfa Meeker, they tried resending but it again failed, they faxing results to my attention.

## 2023-08-21 NOTE — Telephone Encounter (Signed)
Pt would like to know what to do about his feet swelling while he waits on his lab results to come back.

## 2023-08-21 NOTE — Addendum Note (Signed)
Addended byConrad Tarlton D on: 08/21/2023 11:28 AM   Modules accepted: Orders

## 2023-08-21 NOTE — Telephone Encounter (Signed)
Spoke w/ Pt- informed of results and recommendations. Pt verbalized understanding. 1 week f/u scheduled.

## 2023-08-26 ENCOUNTER — Other Ambulatory Visit: Payer: Self-pay | Admitting: Internal Medicine

## 2023-08-26 DIAGNOSIS — E559 Vitamin D deficiency, unspecified: Secondary | ICD-10-CM

## 2023-08-27 NOTE — Progress Notes (Signed)
Established patient visit   Patient: Reginald Parker   DOB: August 29, 1976   47 y.o. Male  MRN: [id number] Visit Date: 08/28/2023  Today's healthcare provider: Alfredia Ferguson, PA-C   Cc. Leg swelling follow up  Subjective    HPI  Pt presents for f/u of b/l lower leg swelling and rash.  He was tx with prednisone 40 mg x 5 days for potential vasculitis. Pt reports his GI doc called him and advised not to take 40 mg. He took 20 since we prescribed, and has still been taking. Reports complete resolution of his symptoms.   Medications: Outpatient Medications Prior to Visit  Medication Sig   alendronate (FOSAMAX) 70 MG tablet Take 1 tablet (70 mg total) by mouth once a week. Take with full glass of water on empty stomach   cetirizine (ZYRTEC) 10 MG tablet Take 10 mg by mouth daily.   Cholecalciferol (VITAMIN D) 50 MCG (2000 UT) CAPS Take by mouth.   fluticasone (FLONASE) 50 MCG/ACT nasal spray Place 1 spray into both nostrils daily.   NON FORMULARY [facility]  Antifungal- (nail)-#1  Dr Janace Hoard   ursodiol (ACTIGALL) 500 MG tablet Take 500 mg by mouth 2 (two) times daily.   Vitamin D, Ergocalciferol, (DRISDOL) 1.25 MG (50000 UNIT) CAPS capsule Take 1 capsule (50,000 Units total) by mouth every 7 (seven) days.   albuterol (VENTOLIN HFA) 108 (90 Base) MCG/ACT inhaler Inhale 2 puffs into the lungs every 6 (six) hours as needed for wheezing or shortness of breath. (Patient not taking: Reported on 08/18/2023)   No facility-administered medications prior to visit.    Review of Systems  Constitutional:  Negative for fatigue and fever.  Respiratory:  Negative for cough and shortness of breath.   Cardiovascular:  Negative for chest pain, palpitations and leg swelling.  Neurological:  Negative for dizziness and headaches.      Objective    BP 124/82   Pulse 78   Temp 98.1 F (36.7 C) (Oral)   Resp 16   Ht 5\' 6"  (1.676 m)   Wt 157 lb 8 oz (71.4 kg)   SpO2 99%    BMI 25.42 kg/m   Physical Exam Vitals reviewed.  Constitutional:      Appearance: He is not ill-appearing.  HENT:     Head: Normocephalic.  Eyes:     Conjunctiva/sclera: Conjunctivae normal.  Cardiovascular:     Rate and Rhythm: Normal rate.  Pulmonary:     Effort: Pulmonary effort is normal. No respiratory distress.  Musculoskeletal:     Comments: Lower extremity resolution has resolved. Rash has completely resolved.  Neurological:     General: No focal deficit present.     Mental Status: He is alert and oriented to person, place, and time.  Psychiatric:        Mood and Affect: Mood normal.        Behavior: Behavior normal.      No results found for any visits on 08/28/23.  Assessment & Plan     1. Vasculitis (HCC) Resolved.  D/t prolonged prednisone use, > 5 days use, recommending tapering down. 10, x 3 days and then to 5 mg x 2-3 days. Advised pt if swelling or rash returns after finishing prednisone taper to let us know.  Return if symptoms worsen or fail to improve.      I, Alfredia Ferguson, PA-C have reviewed all documentation for this visit. The documentation on  08/28/23   for the exam,  diagnosis, procedures, and orders are all accurate and complete.    Alfredia Ferguson, PA-C  [facility] at [facility] [phone] (phone) [phone] (fax)  [facility]

## 2023-08-28 ENCOUNTER — Ambulatory Visit (INDEPENDENT_AMBULATORY_CARE_PROVIDER_SITE_OTHER): Payer: BC Managed Care – PPO | Admitting: Physician Assistant

## 2023-08-28 ENCOUNTER — Encounter: Payer: Self-pay | Admitting: Physician Assistant

## 2023-08-28 VITALS — BP 124/82 | HR 78 | Temp 98.1°F | Resp 16 | Ht 66.0 in | Wt 157.5 lb

## 2023-08-28 DIAGNOSIS — I776 Arteritis, unspecified: Secondary | ICD-10-CM

## 2023-09-29 DIAGNOSIS — K8301 Primary sclerosing cholangitis: Secondary | ICD-10-CM | POA: Diagnosis not present

## 2023-11-03 ENCOUNTER — Encounter: Payer: Self-pay | Admitting: Internal Medicine

## 2023-11-03 ENCOUNTER — Ambulatory Visit (INDEPENDENT_AMBULATORY_CARE_PROVIDER_SITE_OTHER): Payer: BC Managed Care – PPO | Admitting: Internal Medicine

## 2023-11-03 VITALS — BP 100/66 | HR 60 | Temp 98.2°F | Resp 16 | Ht 66.0 in | Wt 146.1 lb

## 2023-11-03 DIAGNOSIS — K50919 Crohn's disease, unspecified, with unspecified complications: Secondary | ICD-10-CM

## 2023-11-03 DIAGNOSIS — R739 Hyperglycemia, unspecified: Secondary | ICD-10-CM | POA: Diagnosis not present

## 2023-11-03 DIAGNOSIS — R634 Abnormal weight loss: Secondary | ICD-10-CM

## 2023-11-03 DIAGNOSIS — E559 Vitamin D deficiency, unspecified: Secondary | ICD-10-CM

## 2023-11-03 DIAGNOSIS — R7989 Other specified abnormal findings of blood chemistry: Secondary | ICD-10-CM

## 2023-11-03 DIAGNOSIS — Z23 Encounter for immunization: Secondary | ICD-10-CM | POA: Diagnosis not present

## 2023-11-03 LAB — CBC WITH DIFFERENTIAL/PLATELET
Basophils Absolute: 0 10*3/uL (ref 0.0–0.1)
Basophils Relative: 0.5 % (ref 0.0–3.0)
Eosinophils Absolute: 2.6 10*3/uL — ABNORMAL HIGH (ref 0.0–0.7)
Eosinophils Relative: 35.8 % — ABNORMAL HIGH (ref 0.0–5.0)
HCT: 36.3 % — ABNORMAL LOW (ref 39.0–52.0)
Hemoglobin: 11.9 g/dL — ABNORMAL LOW (ref 13.0–17.0)
Lymphocytes Relative: 19.4 % (ref 12.0–46.0)
Lymphs Abs: 1.4 10*3/uL (ref 0.7–4.0)
MCHC: 32.8 g/dL (ref 30.0–36.0)
MCV: 84.3 fL (ref 78.0–100.0)
Monocytes Absolute: 0.6 10*3/uL (ref 0.1–1.0)
Monocytes Relative: 7.7 % (ref 3.0–12.0)
Neutro Abs: 2.6 10*3/uL (ref 1.4–7.7)
Neutrophils Relative %: 36.6 % — ABNORMAL LOW (ref 43.0–77.0)
Platelets: 353 10*3/uL (ref 150.0–400.0)
RBC: 4.31 Mil/uL (ref 4.22–5.81)
RDW: 15.7 % — ABNORMAL HIGH (ref 11.5–15.5)
WBC: 7.2 10*3/uL (ref 4.0–10.5)

## 2023-11-03 LAB — HEPATIC FUNCTION PANEL
ALT: 14 U/L (ref 0–53)
AST: 11 U/L (ref 0–37)
Albumin: 3 g/dL — ABNORMAL LOW (ref 3.5–5.2)
Alkaline Phosphatase: 158 U/L — ABNORMAL HIGH (ref 39–117)
Bilirubin, Direct: 0.6 mg/dL — ABNORMAL HIGH (ref 0.0–0.3)
Total Bilirubin: 1.6 mg/dL — ABNORMAL HIGH (ref 0.2–1.2)
Total Protein: 8.7 g/dL — ABNORMAL HIGH (ref 6.0–8.3)

## 2023-11-03 LAB — VITAMIN D 25 HYDROXY (VIT D DEFICIENCY, FRACTURES): VITD: 31.19 ng/mL (ref 30.00–100.00)

## 2023-11-03 LAB — HEMOGLOBIN A1C: Hgb A1c MFr Bld: 5.7 % (ref 4.6–6.5)

## 2023-11-03 NOTE — Assessment & Plan Note (Signed)
Osteoporosis: started Fosamax 05-2022. DEXA 07/08/2022: Z-score -4.2. Plan: Continue Fosamax for now, DEXA at the 2-year mark ~ 06/2024. Vitamin D deficiency: Had ergocalciferol, now on OTCs.  Checking labs Weight loss: As described above, ROS is benign, etiology not completely clear, last TSH very good, will check CBC, LFTs, prealbumin, A1c and a chest x-ray.  Encouraged to let GI know about this issue. Crohn's, primary sclerosing cholangitis: Per patient, GI draw blood work every 3 months, again encouraged to discuss weight loss w/ GI. Hyperglycemia: Checking A1c Preventive care: Flu shot today RTC 6 months

## 2023-11-03 NOTE — Progress Notes (Signed)
Subjective:    Patient ID: Reginald Parker, male    DOB: [birth year], 47 y.o.   MRN: [id number]  DOS:  11/03/2023 Type of visit - description: f/u  Follow-up from previous visit. The patient has lost weight, he is not doing anything different.  Denies fever or chills. No headaches. No cough. Had transient lower extremity edema in September but that is largely resolved. GI output seems normal to him. Denies depression, appetite is good.   Wt Readings from Last 3 Encounters:  11/03/23 146 lb 2 oz (66.3 kg)  08/28/23 157 lb 8 oz (71.4 kg)  08/18/23 165 lb 8 oz (75.1 kg)     Review of Systems See above   Past Medical History:  Diagnosis Date   Crohn's disease (HCC)    universal colitis   Elevated liver function tests    Osteoporosis 06/01/2015   Premature ventricular contractions    Primary sclerosing cholangitis    Seasonal allergies     Past Surgical History:  Procedure Laterality Date   LAPAROSCOPIC TOTAL ABDOMINAL COLECTOMY  07/29/2022   w/ end ileostomy   NASAL SINUS SURGERY  11/24/2010   PROCTECTOMY W/ CREATION OF COLON RESERVOIR  01/09/2023    Current Outpatient Medications  Medication Instructions   albuterol (VENTOLIN HFA) 108 (90 Base) MCG/ACT inhaler 2 puffs, Inhalation, Every 6 hours PRN   alendronate (FOSAMAX) 70 mg, Oral, Weekly, Take with full glass of water on empty stomach   cetirizine (ZYRTEC) 10 mg, Oral, Daily   Cholecalciferol (VITAMIN D) 50 MCG (2000 UT) CAPS Oral   fluticasone (FLONASE) 50 MCG/ACT nasal spray 1 spray, Each Nare, Daily   NON FORMULARY [facility]<BR><BR>Antifungal- (nail)-#1<BR><BR>Dr Wagoner/Lisa   ursodiol (ACTIGALL) 500 mg, Oral, 2 times daily       Objective:   Physical Exam BP 100/66   Pulse 60   Temp 98.2 F (36.8 C) (Oral)   Resp 16   Ht 5\' 6"  (1.676 m)   Wt 146 lb 2 oz (66.3 kg)   BMI 23.59 kg/m  General:   Well developed, NAD, BMI noted.  HEENT:  Normocephalic . Face symmetric,  atraumatic Lungs:  CTA B Normal respiratory effort, no intercostal retractions, no accessory muscle use. Heart: RRR,  no murmur.  Abdomen:  Not distended, soft, non-tender. No rebound or rigidity.   Skin: Not pale. Not jaundice Lower extremities: no pretibial edema bilaterally  Neurologic:  alert & oriented X3.  Speech normal, gait appropriate for age and unassisted Psych--  Cognition and judgment appear intact.  Cooperative with normal attention span and concentration.  Behavior appropriate. No anxious or depressed appearing.     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  Rx observation unless symptoms Admitted 08-2021: GI bleed, 2 PRBCs, left leg DVT  PLAN Osteoporosis: started Fosamax 05-2022. DEXA 07/08/2022: Z-score -4.2. Plan: Continue Fosamax for now, DEXA at the 2-year mark ~ 06/2024. Vitamin D deficiency: Had ergocalciferol, now on OTCs.  Checking labs Weight loss: As described above, ROS is benign, etiology not completely clear, last TSH very good, will  check CBC, LFTs, prealbumin, A1c and a chest x-ray.  Encouraged to let GI know about this issue. Crohn's, primary sclerosing cholangitis: Per patient, GI draw blood work every 3 months, again encouraged to discuss weight loss w/ GI. Hyperglycemia: Checking A1c Preventive care: Flu shot today RTC 6 months

## 2023-11-03 NOTE — Patient Instructions (Signed)
   GO TO THE LAB : Get the blood work     Next visit with me in 6 months; please schedule it at the front desk    STOP BY THE FIRST FLOOR:  get the XR

## 2023-11-04 ENCOUNTER — Ambulatory Visit (HOSPITAL_BASED_OUTPATIENT_CLINIC_OR_DEPARTMENT_OTHER)
Admission: RE | Admit: 2023-11-04 | Discharge: 2023-11-04 | Disposition: A | Discharge status: Home / Self Care | Payer: BC Managed Care – PPO | Source: Ambulatory Visit | Attending: Internal Medicine | Admitting: Internal Medicine

## 2023-11-04 DIAGNOSIS — R634 Abnormal weight loss: Secondary | ICD-10-CM | POA: Insufficient documentation

## 2023-11-05 LAB — PREALBUMIN: Prealbumin: 15 mg/dL — ABNORMAL LOW (ref 21–43)

## 2023-11-13 NOTE — Addendum Note (Signed)
Addended byConrad Nelson D on: 11/13/2023 04:46 PM   Modules accepted: Orders

## 2023-12-04 DIAGNOSIS — Z20822 Contact with and (suspected) exposure to covid-19: Secondary | ICD-10-CM | POA: Diagnosis not present

## 2023-12-04 DIAGNOSIS — R059 Cough, unspecified: Secondary | ICD-10-CM | POA: Diagnosis not present

## 2023-12-04 DIAGNOSIS — J1000 Influenza due to other identified influenza virus with unspecified type of pneumonia: Secondary | ICD-10-CM | POA: Diagnosis not present

## 2023-12-04 DIAGNOSIS — U071 COVID-19: Secondary | ICD-10-CM | POA: Diagnosis not present

## 2024-01-06 DIAGNOSIS — K8301 Primary sclerosing cholangitis: Secondary | ICD-10-CM | POA: Diagnosis not present

## 2024-02-20 DIAGNOSIS — J019 Acute sinusitis, unspecified: Secondary | ICD-10-CM | POA: Diagnosis not present

## 2024-02-20 DIAGNOSIS — R059 Cough, unspecified: Secondary | ICD-10-CM | POA: Diagnosis not present

## 2024-02-20 DIAGNOSIS — Z20822 Contact with and (suspected) exposure to covid-19: Secondary | ICD-10-CM | POA: Diagnosis not present

## 2024-02-29 DIAGNOSIS — K5100 Ulcerative (chronic) pancolitis without complications: Secondary | ICD-10-CM | POA: Diagnosis not present

## 2024-02-29 DIAGNOSIS — K8301 Primary sclerosing cholangitis: Secondary | ICD-10-CM | POA: Diagnosis not present

## 2024-03-25 DIAGNOSIS — K8301 Primary sclerosing cholangitis: Secondary | ICD-10-CM | POA: Diagnosis not present

## 2024-04-22 DIAGNOSIS — K8301 Primary sclerosing cholangitis: Secondary | ICD-10-CM | POA: Diagnosis not present

## 2024-05-03 ENCOUNTER — Encounter: Payer: Self-pay | Admitting: Internal Medicine

## 2024-05-03 ENCOUNTER — Ambulatory Visit (INDEPENDENT_AMBULATORY_CARE_PROVIDER_SITE_OTHER): Payer: BC Managed Care – PPO | Admitting: Internal Medicine

## 2024-05-03 VITALS — BP 116/62 | HR 74 | Temp 98.0°F | Resp 16 | Ht 66.0 in | Wt 158.2 lb

## 2024-05-03 DIAGNOSIS — R600 Localized edema: Secondary | ICD-10-CM

## 2024-05-03 DIAGNOSIS — D500 Iron deficiency anemia secondary to blood loss (chronic): Secondary | ICD-10-CM

## 2024-05-03 DIAGNOSIS — D649 Anemia, unspecified: Secondary | ICD-10-CM | POA: Diagnosis not present

## 2024-05-03 DIAGNOSIS — K50919 Crohn's disease, unspecified, with unspecified complications: Secondary | ICD-10-CM

## 2024-05-03 MED ORDER — BUDESONIDE-FORMOTEROL FUMARATE 160-4.5 MCG/ACT IN AERO: 2.0000 | INHALATION_SPRAY | Freq: Two times a day (BID) | RESPIRATORY_TRACT | 3 refills | Status: DC

## 2024-05-03 NOTE — Patient Instructions (Signed)
 Start Symbicort 2 puffs twice a day for cough.  GO TO THE LAB :  Get the blood work   Your results will be posted on MyChart with my comments  Next office visit for a checkup in 3 months Please make an appointment before you leave today

## 2024-05-03 NOTE — Progress Notes (Unsigned)
 Subjective:    Patient ID: Reginald Parker, male    DOB: Dec 02, 1975, 48 y.o.   MRN: [id number]  DOS:  05/03/2024 Type of visit - description: Routine follow-up  Last seen in December.  Weight loss was noted then. That has resolved, weight increased to 150 pounds.  However, patient has developed lower extremity edema. It is worse at the end of the day. Denies chest pain, difficulty breathing or orthopnea.  Also had 2 episodes of sinusitis with some wheezing noted.  Has a history of bronchospasm, was prescribed albuterol , it is not helping as before, Ventolin  was better.  Wt Readings from Last 3 Encounters:  05/03/24 158 lb 4 oz (71.8 kg)  11/03/23 146 lb 2 oz (66.3 kg)  08/28/23 157 lb 8 oz (71.4 kg)       Review of Systems See above   Past Medical History:  Diagnosis Date   Crohn's disease (HCC)    universal colitis   Elevated liver function tests    Osteoporosis 06/01/2015   Premature ventricular contractions    Primary sclerosing cholangitis    Seasonal allergies     Past Surgical History:  Procedure Laterality Date   LAPAROSCOPIC TOTAL ABDOMINAL COLECTOMY  07/29/2022   w/ end ileostomy   NASAL SINUS SURGERY  11/24/2010   PROCTECTOMY W/ CREATION OF COLON RESERVOIR  01/09/2023    Current Outpatient Medications  Medication Instructions   albuterol  (VENTOLIN  HFA) 108 (90 Base) MCG/ACT inhaler 2 puffs, Inhalation, Every 6 hours PRN   alendronate  (FOSAMAX ) 70 mg, Oral, Weekly, Take with full glass of water on empty stomach   cetirizine (ZYRTEC) 10 mg, Daily   Cholecalciferol (VITAMIN D ) 50 MCG (2000 UT) CAPS Take by mouth.   fluticasone (FLONASE) 50 MCG/ACT nasal spray 1 spray, Daily   NON FORMULARY [facility]  Antifungal- (nail)-#1  Dr Jenise Mixer   ursodiol  (ACTIGALL ) 500 mg, 2 times daily       Objective:   Physical Exam BP 116/62   Pulse 74   Temp 98 F (36.7 C) (Oral)   Resp 16   Ht 5' 6 (1.676 m)   Wt 158 lb 4 oz (71.8 kg)   BMI  25.54 kg/m  General:   Well developed, NAD, BMI noted.  HEENT:  Normocephalic . Face symmetric, atraumatic Neck, no JVD at 45 degrees Lungs:  CTA B Normal respiratory effort, no intercostal retractions, no accessory muscle use. Heart: RRR,  no murmur.  Abdomen:  Not distended, soft, no shifting dullness Skin: Not pale. Not jaundice Lower extremities: Soft pretibial and  peri-ankle edema bilaterally. Neurologic:  alert & oriented X3.  Speech normal, gait appropriate for age and unassisted Psych--  Cognition and judgment appear intact.  Cooperative with normal attention span and concentration.  Behavior appropriate. No anxious or depressed appearing.     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  Rx observation unless symptoms Admitted 08-2021: GI bleed, 2 PRBCs, left leg DVT  PLAN Weight loss: See LOV, chest x-ray negative,Prealbumin slightly low, A1c okay.  He now has gained weight but  has edema.  It is possible that the weight gain is mostly fluid. LE Edema: Bilateral, typically at the end of the day, no JVD or shifting abdominal dullness on exam.  History of low prealbumin. TSH has been consistently WNL. Plan: CMP, prealbumin, CBC, micro UA.  Further advised with results Crohn's disease:  See above.  Currently with no nausea or vomiting.  No diarrhea or blood in the stools.  He does have a history of low prealbumin, possibly related to Crohn's? Sees GI regularly,   will  request labs and office visit notes. Vitamin D  deficiency: On OTCs, last vitamin D  increased to 31.9.  Continue OTCs. Osteoporosis: Next DEXA 06-2024.  On Fosamax  Bronchospasm: Cough and some wheezing at night, albuterol  is not helping as before.  No orthopnea. Plan: With Symbicort. RTC 3 months

## 2024-05-04 ENCOUNTER — Telehealth: Payer: Self-pay | Admitting: Internal Medicine

## 2024-05-04 LAB — MICROALBUMIN / CREATININE URINE RATIO
Creatinine, Urine: 77 mg/dL (ref 20–320)
Microalb Creat Ratio: 5 mg/g{creat} (ref <30)
Microalb, Ur: 0.4 mg/dL

## 2024-05-04 LAB — URINALYSIS, ROUTINE W REFLEX MICROSCOPIC
Bilirubin Urine: NEGATIVE
Glucose, UA: NEGATIVE
Hgb urine dipstick: NEGATIVE
Ketones, ur: NEGATIVE
Leukocytes,Ua: NEGATIVE
Nitrite: NEGATIVE
Protein, ur: NEGATIVE
Specific Gravity, Urine: 1.009 (ref 1.001–1.035)
pH: 7.5 (ref 5.0–8.0)

## 2024-05-04 LAB — CBC WITH DIFFERENTIAL/PLATELET
Absolute Lymphocytes: 1486 {cells}/uL (ref 850–3900)
Absolute Monocytes: 672 {cells}/uL (ref 200–950)
Basophils Absolute: 50 {cells}/uL (ref 0–200)
Basophils Relative: 0.6 %
Eosinophils Absolute: 1394 {cells}/uL — ABNORMAL HIGH (ref 15–500)
Eosinophils Relative: 16.8 %
HCT: 27.3 % — ABNORMAL LOW (ref 38.5–50.0)
Hemoglobin: 7.2 g/dL — ABNORMAL LOW (ref 13.2–17.1)
MCH: 18.3 pg — ABNORMAL LOW (ref 27.0–33.0)
MCHC: 26.4 g/dL — ABNORMAL LOW (ref 32.0–36.0)
MCV: 69.3 fL — ABNORMAL LOW (ref 80.0–100.0)
MPV: 9.6 fL (ref 7.5–12.5)
Monocytes Relative: 8.1 %
Neutro Abs: 4698 {cells}/uL (ref 1500–7800)
Neutrophils Relative %: 56.6 %
Platelets: 495 10*3/uL — ABNORMAL HIGH (ref 140–400)
RBC: 3.94 10*6/uL — ABNORMAL LOW (ref 4.20–5.80)
RDW: 17.4 % — ABNORMAL HIGH (ref 11.0–15.0)
Total Lymphocyte: 17.9 %
WBC: 8.3 10*3/uL (ref 3.8–10.8)

## 2024-05-04 LAB — COMPREHENSIVE METABOLIC PANEL WITH GFR
AG Ratio: 0.4 (calc) — ABNORMAL LOW (ref 1.0–2.5)
ALT: 47 U/L — ABNORMAL HIGH (ref 9–46)
AST: 57 U/L — ABNORMAL HIGH (ref 10–40)
Albumin: 2.6 g/dL — ABNORMAL LOW (ref 3.6–5.1)
Alkaline phosphatase (APISO): 334 U/L — ABNORMAL HIGH (ref 36–130)
BUN/Creatinine Ratio: 7 (calc) (ref 6–22)
BUN: 5 mg/dL — ABNORMAL LOW (ref 7–25)
CO2: 24 mmol/L (ref 20–32)
Calcium: 7.8 mg/dL — ABNORMAL LOW (ref 8.6–10.3)
Chloride: 103 mmol/L (ref 98–110)
Creat: 0.7 mg/dL (ref 0.60–1.29)
Globulin: 6.8 g/dL — ABNORMAL HIGH (ref 1.9–3.7)
Glucose, Bld: 85 mg/dL (ref 65–99)
Potassium: 4.2 mmol/L (ref 3.5–5.3)
Sodium: 133 mmol/L — ABNORMAL LOW (ref 135–146)
Total Bilirubin: 1.6 mg/dL — ABNORMAL HIGH (ref 0.2–1.2)
Total Protein: 9.4 g/dL — ABNORMAL HIGH (ref 6.1–8.1)
eGFR: 114 mL/min/{1.73_m2} (ref >=60)

## 2024-05-04 LAB — PREALBUMIN: Prealbumin: 7 mg/dL — ABNORMAL LOW (ref 21–43)

## 2024-05-04 LAB — CBC MORPHOLOGY

## 2024-05-04 NOTE — Telephone Encounter (Signed)
-   Sodium decreased. - Low calcium likely from hypoalbuminemia - Increased LFTs and A.P. - Worsening hypoalbuminemia (no microalbuminuria) - Severe microcytic anemia, bilirubin at baseline. Plan: - Will add a anemia panel - Spoke with the patient, he again denies any nausea, vomiting, diarrhea.  No blood in the stools.  No chest pain, difficulty breathing or palpitations (thus anemia is severe but asx,  recommend ER if chest pain or difficulty breathing) - Called Dr. Victoriano Grate office, he is not in today, left a message, requesting to check blood work and advise further.  Will send him a message

## 2024-05-04 NOTE — Assessment & Plan Note (Signed)
 Weight loss: See LOV, chest x-ray negative,Prealbumin slightly low, A1c okay.  He now has gained weight but has edema.  It is possible that the weight gain is mostly fluid. LE Edema: Bilateral, typically at the end of the day, no JVD or shifting abdominal dullness on exam.  History of low prealbumin. TSH has been consistently WNL. Plan: CMP, prealbumin, CBC, micro UA.  Further advised with results Crohn's disease:  See above.  Currently with no nausea or vomiting.  No diarrhea or blood in the stools.  He does have a history of low prealbumin, possibly related to Crohn's? Sees GI regularly,   will request labs and office visit notes. Vitamin D  deficiency: On OTCs, last vitamin D  increased to 31.9.  Continue OTCs. Osteoporosis: Next DEXA 06-2024.  On Fosamax  Bronchospasm: Cough and some wheezing at night, albuterol  is not helping as before.  No orthopnea. Plan: With Symbicort. RTC 3 months

## 2024-05-05 ENCOUNTER — Ambulatory Visit: Payer: Self-pay

## 2024-05-06 ENCOUNTER — Other Ambulatory Visit: Payer: Self-pay | Admitting: Gastroenterology

## 2024-05-10 NOTE — Telephone Encounter (Signed)
 Spoke with the patient today. Already contacted by GI, they are planning endoscopy and further evaluation.

## 2024-05-15 NOTE — Anesthesia Preprocedure Evaluation (Signed)
 Anesthesia Evaluation  Patient identified by MRN, date of birth, ID band Patient awake    Reviewed: Allergy & Precautions, NPO status , Patient's Chart, lab work & pertinent test results  Airway Mallampati: III  TM Distance: >3 FB Neck ROM: Full    Dental  (+) Teeth Intact, Missing, Dental Advisory Given,    Pulmonary PE Latent TB   Pulmonary exam normal breath sounds clear to auscultation       Cardiovascular + DVT  Normal cardiovascular exam Rhythm:Regular Rate:Normal  TTE 2022 1. Prominent LV apical band. Left ventricular ejection fraction, by  estimation, is 60 to 65%. The left ventricle has normal function. The left  ventricle has no regional wall motion abnormalities. Left ventricular  diastolic parameters were normal.   2. Right ventricular systolic function is normal. The right ventricular  size is normal.   3. The mitral valve is normal in structure. Trivial mitral valve  regurgitation. No evidence of mitral stenosis.   4. The aortic valve is normal in structure. Aortic valve regurgitation is  not visualized. No aortic stenosis is present.   5. The inferior vena cava is normal in size with greater than 50%  respiratory variability, suggesting right atrial pressure of 3 mmHg.     Neuro/Psych negative neurological ROS  negative psych ROS   GI/Hepatic Neg liver ROS, PUD,,,Primary sclerosing cholangitis Crohn's   Endo/Other  negative endocrine ROS    Renal/GU negative Renal ROS  negative genitourinary   Musculoskeletal negative musculoskeletal ROS (+)    Abdominal   Peds  Hematology  (+) Blood dyscrasia, anemia   Anesthesia Other Findings   Reproductive/Obstetrics                             Anesthesia Physical Anesthesia Plan  ASA: 3  Anesthesia Plan: MAC   Post-op Pain Management:    Induction: Intravenous  PONV Risk Score and Plan: Propofol  infusion and Treatment  may vary due to age or medical condition  Airway Management Planned: Natural Airway  Additional Equipment:   Intra-op Plan:   Post-operative Plan:   Informed Consent: I have reviewed the patients History and Physical, chart, labs and discussed the procedure including the risks, benefits and alternatives for the proposed anesthesia with the patient or authorized representative who has indicated his/her understanding and acceptance.     Dental advisory given  Plan Discussed with: CRNA  Anesthesia Plan Comments:        Anesthesia Quick Evaluation

## 2024-05-16 ENCOUNTER — Ambulatory Visit (HOSPITAL_COMMUNITY): Payer: Self-pay | Admitting: Certified Registered"

## 2024-05-16 ENCOUNTER — Ambulatory Visit (HOSPITAL_COMMUNITY)
Admission: RE | Admit: 2024-05-16 | Discharge: 2024-05-16 | Disposition: A | Discharge status: Home / Self Care | Attending: Gastroenterology | Admitting: Gastroenterology

## 2024-05-16 ENCOUNTER — Other Ambulatory Visit: Payer: Self-pay

## 2024-05-16 ENCOUNTER — Encounter (HOSPITAL_COMMUNITY)
Admission: RE | Disposition: A | Discharge status: Home / Self Care | Payer: Self-pay | Source: Home / Self Care | Attending: Gastroenterology

## 2024-05-16 ENCOUNTER — Encounter (HOSPITAL_COMMUNITY): Payer: Self-pay | Admitting: Gastroenterology

## 2024-05-16 DIAGNOSIS — K6389 Other specified diseases of intestine: Secondary | ICD-10-CM | POA: Diagnosis not present

## 2024-05-16 DIAGNOSIS — K3189 Other diseases of stomach and duodenum: Secondary | ICD-10-CM | POA: Diagnosis not present

## 2024-05-16 DIAGNOSIS — K2970 Gastritis, unspecified, without bleeding: Secondary | ICD-10-CM | POA: Insufficient documentation

## 2024-05-16 DIAGNOSIS — K5090 Crohn's disease, unspecified, without complications: Secondary | ICD-10-CM | POA: Diagnosis not present

## 2024-05-16 DIAGNOSIS — D509 Iron deficiency anemia, unspecified: Secondary | ICD-10-CM | POA: Insufficient documentation

## 2024-05-16 DIAGNOSIS — K921 Melena: Secondary | ICD-10-CM | POA: Diagnosis not present

## 2024-05-16 DIAGNOSIS — Z86718 Personal history of other venous thrombosis and embolism: Secondary | ICD-10-CM | POA: Diagnosis not present

## 2024-05-16 DIAGNOSIS — D649 Anemia, unspecified: Secondary | ICD-10-CM | POA: Diagnosis not present

## 2024-05-16 DIAGNOSIS — K529 Noninfective gastroenteritis and colitis, unspecified: Secondary | ICD-10-CM | POA: Insufficient documentation

## 2024-05-16 HISTORY — PX: FLEXIBLE SIGMOIDOSCOPY: SHX5431

## 2024-05-16 HISTORY — PX: ESOPHAGOGASTRODUODENOSCOPY: SHX5428

## 2024-05-16 SURGERY — EGD (ESOPHAGOGASTRODUODENOSCOPY)
Anesthesia: Monitor Anesthesia Care

## 2024-05-16 MED ORDER — LIDOCAINE 2% (20 MG/ML) 5 ML SYRINGE
INTRAMUSCULAR | Status: DC | PRN
Start: 2024-05-16 — End: 2024-05-16
  Administered 2024-05-16: 60 mg via INTRAVENOUS

## 2024-05-16 MED ORDER — PROPOFOL 500 MG/50ML IV EMUL
INTRAVENOUS | Status: DC | PRN
  Administered 2024-05-16: 100 ug/kg/min via INTRAVENOUS

## 2024-05-16 MED ORDER — PROPOFOL 10 MG/ML IV BOLUS
INTRAVENOUS | Status: DC | PRN
  Administered 2024-05-16: 20 mg via INTRAVENOUS
  Administered 2024-05-16: 10 mg via INTRAVENOUS
  Administered 2024-05-16: 50 mg via INTRAVENOUS
  Administered 2024-05-16: 20 mg via INTRAVENOUS

## 2024-05-16 MED ORDER — SODIUM CHLORIDE 0.9 % IV SOLN: INTRAVENOUS | Status: DC

## 2024-05-16 NOTE — Interval H&P Note (Signed)
 History and Physical Interval Note:  05/16/2024 7:23 AM  Reginald Parker  has presented today for surgery, with the diagnosis of Anemia.  The various methods of treatment have been discussed with the patient and family. After consideration of risks, benefits and other options for treatment, the patient has consented to  Procedure(s): EGD (ESOPHAGOGASTRODUODENOSCOPY) (N/A) as a surgical intervention.  The patient's history has been reviewed, patient examined, no change in status, stable for surgery.  I have reviewed the patient's chart and labs.  Questions were answered to the patient's satisfaction.     [name]

## 2024-05-16 NOTE — Op Note (Signed)
 [facility] Patient Name: Reginald Parker Procedure Date : 05/16/2024 MRN: [id number] Attending MD: [name] , MD, [id number] Date of Birth: 11-03-1976 CSN: [id number] Age: 48 Admit Type: Outpatient Procedure:                Flexible Sigmoidoscopy Indications:              Hematochezia Providers:                Jerrell KYM Sol, MD, Hoy Penner, RN, Felice Sar, Technician Referring MD:             Aloysius Mech, MD Medicines:                Propofol  per Anesthesia, Monitored Anesthesia Care Complications:            No immediate complications. Estimated Blood Loss:     Estimated blood loss was minimal. Procedure:                Pre-Anesthesia Assessment:                           - Prior to the procedure, a History and Physical                            was performed, and patient medications and                            allergies were reviewed. The patient's tolerance of                            previous anesthesia was also reviewed. The risks                            and benefits of the procedure and the sedation                            options and risks were discussed with the patient.                            All questions were answered, and informed consent                            was obtained. Prior Anticoagulants: The patient has                            taken no anticoagulant or antiplatelet agents. ASA                            Grade Assessment: III - A patient with severe                            systemic disease. After reviewing the risks and  benefits, the patient was deemed in satisfactory                            condition to undergo the procedure.                           After obtaining informed consent, the scope was                            passed under direct vision. The [id number]-[id number] ([id number])                            Olympus endoscope was introduced through the  anus                            and advanced to the 30 cm from the anal verge. The                            flexible sigmoidoscopy was accomplished without                            difficulty. The patient tolerated the procedure                            well. The quality of the bowel preparation was an                            unprepped procedure. Scope In: 8:38:38 AM Scope Out: 8:45:51 AM Total Procedure Duration: 0 hours 7 minutes 13 seconds  Findings:      The perianal and digital rectal examinations were normal.      A patchy area of moderately congested, erythematous, inflamed and       petechial mucosa was found from 0 to 30 cm proximal to the anus.      J-pouch examined with inflammation as stated above. Mucosa very friable. Impression:               - Congested, erythematous, inflamed and petechial                            mucosa from 0 to 30 cm proximal to the anus.                           - No specimens collected. Recommendation:           - Discharge patient to home.                           - Continue present medications. Procedure Code(s):        --- Professional ---                           [id number], Sigmoidoscopy, flexible; diagnostic,                            including collection of specimen(s) by brushing or  washing, when performed (separate procedure) Diagnosis Code(s):        --- Professional ---                           K92.1, Melena (includes Hematochezia)                           K52.9, Noninfective gastroenteritis and colitis,                            unspecified                           K63.89, Other specified diseases of intestine CPT copyright 2022 [employer]. All rights reserved. The codes documented in this report are preliminary and upon coder review may  be revised to meet current compliance requirements. [name], MD 05/16/2024 8:55:22 AM This report has been signed  electronically. Number of Addenda: 0

## 2024-05-16 NOTE — Op Note (Signed)
 [facility] Patient Name: Reginald Parker Procedure Date : 05/16/2024 MRN: [id number] Attending MD: [name] , MD, [id number] Date of Birth: 03/07/1976 CSN: [id number] Age: 48 Admit Type: Outpatient Procedure:                Upper GI endoscopy Indications:              Iron  deficiency anemia, Hematochezia Providers:                Jerrell KYM Sol, MD, Hoy Penner, RN, Felice Sar, Technician Referring MD:             Aloysius Mech, MD Medicines:                Propofol  per Anesthesia, Monitored Anesthesia Care Complications:            No immediate complications. Estimated Blood Loss:     Estimated blood loss was minimal. Procedure:                Pre-Anesthesia Assessment:                           - Prior to the procedure, a History and Physical                            was performed, and patient medications and                            allergies were reviewed. The patient's tolerance of                            previous anesthesia was also reviewed. The risks                            and benefits of the procedure and the sedation                            options and risks were discussed with the patient.                            All questions were answered, and informed consent                            was obtained. Prior Anticoagulants: The patient has                            taken no anticoagulant or antiplatelet agents. ASA                            Grade Assessment: III - A patient with severe                            systemic disease. After reviewing the risks and  benefits, the patient was deemed in satisfactory                            condition to undergo the procedure.                           After obtaining informed consent, the endoscope was                            passed under direct vision. Throughout the                            procedure, the patient's blood  pressure, pulse, and                            oxygen saturations were monitored continuously. The                            [id number]-H190 ([id number]) Olympus endoscope was introduced                            through the mouth, and advanced to the second part                            of duodenum. After obtaining informed consent, the                            endoscope was passed under direct vision.                            Throughout the procedure, the patient's blood                            pressure, pulse, and oxygen saturations were                            monitored continuously. The upper GI endoscopy was                            accomplished without difficulty. The patient                            tolerated the procedure well. Scope In: Scope Out: Findings:      The examined esophagus was normal.      The Z-line was regular and was found 40 cm from the incisors.      Segmental minimal inflammation characterized by congestion (edema) was       found in the gastric antrum.      The cardia and gastric fundus were normal on retroflexion.      The examined duodenum was normal. Biopsies for histology were taken with       a cold forceps for evaluation of celiac disease. Estimated blood loss       was minimal. Impression:               - Normal  esophagus.                           - Z-line regular, 40 cm from the incisors.                           - Gastritis.                           - Normal examined duodenum. Biopsied. Recommendation:           - Patient has a contact number available for                            emergencies. The signs and symptoms of potential                            delayed complications were discussed with the                            patient. Return to normal activities tomorrow.                            Written discharge instructions were provided to the                            patient.                           - Resume previous  diet.                           - Await pathology results. Procedure Code(s):        --- Professional ---                           [id number], Esophagogastroduodenoscopy, flexible,                            transoral; with biopsy, single or multiple Diagnosis Code(s):        --- Professional ---                           D50.9, Iron  deficiency anemia, unspecified                           K92.1, Melena (includes Hematochezia)                           K29.70, Gastritis, unspecified, without bleeding CPT copyright 2022 American Medical Association. All rights reserved. The codes documented in this report are preliminary and upon coder review may  be revised to meet current compliance requirements. [name], MD 05/16/2024 8:50:07 AM This report has been signed electronically. Number of Addenda: 0

## 2024-05-16 NOTE — Discharge Instructions (Signed)

## 2024-05-16 NOTE — Transfer of Care (Signed)
 Immediate Anesthesia Transfer of Care Note  Patient: Reginald Parker  Procedure(s) Performed: EGD (ESOPHAGOGASTRODUODENOSCOPY) SIGMOIDOSCOPY, FLEXIBLE  Patient Location: PACU and Endoscopy Unit  Anesthesia Type:MAC  Level of Consciousness: sedated  Airway & Oxygen Therapy: Patient Spontanous Breathing  Post-op Assessment: Report given to RN and Post -op Vital signs reviewed and stable  Post vital signs: Reviewed and stable  Last Vitals:  Vitals Value Taken Time  BP    Temp    Pulse    Resp    SpO2      Last Pain:  Vitals:   05/16/24 0728  TempSrc: Temporal  PainSc: 0-No pain         Complications: No notable events documented.

## 2024-05-16 NOTE — H&P (Signed)
 Date of Initial H&P: 05/06/24  History reviewed, patient examined, no change in status, stable for surgery.

## 2024-05-17 ENCOUNTER — Encounter (HOSPITAL_COMMUNITY): Payer: Self-pay | Admitting: Gastroenterology

## 2024-05-17 LAB — SURGICAL PATHOLOGY

## 2024-05-17 NOTE — Anesthesia Postprocedure Evaluation (Signed)
 Anesthesia Post Note  Patient: Reginald Parker  Procedure(s) Performed: EGD (ESOPHAGOGASTRODUODENOSCOPY) SIGMOIDOSCOPY, FLEXIBLE     Patient location during evaluation: Endoscopy Anesthesia Type: MAC Level of consciousness: awake and alert Pain management: pain level controlled Vital Signs Assessment: post-procedure vital signs reviewed and stable Respiratory status: spontaneous breathing, nonlabored ventilation, respiratory function stable and patient connected to nasal cannula oxygen Cardiovascular status: blood pressure returned to baseline and stable Postop Assessment: no apparent nausea or vomiting Anesthetic complications: no  No notable events documented.  Last Vitals:  Vitals:   05/16/24 0900 05/16/24 0910  BP: 108/80 107/75  Pulse: 91   Resp: 16 15  Temp:    SpO2: 100% 100%    Last Pain:  Vitals:   05/16/24 0912  TempSrc:   PainSc: 0-No pain   Pain Goal:                   Reginald Parker

## 2024-05-23 DIAGNOSIS — D649 Anemia, unspecified: Secondary | ICD-10-CM | POA: Diagnosis not present

## 2024-05-23 DIAGNOSIS — K8301 Primary sclerosing cholangitis: Secondary | ICD-10-CM | POA: Diagnosis not present

## 2024-05-30 ENCOUNTER — Ambulatory Visit: Payer: Self-pay

## 2024-05-30 NOTE — Telephone Encounter (Signed)
 Needs appt please.

## 2024-05-30 NOTE — Progress Notes (Unsigned)
 [facility]   Telephone:([phone] Fax:(336) [phone]   Clinic New consult Note   Patient Care Team: [name], MD as PCP - [name], MD as Consulting Physician (Gastroenterology) 05/31/2024  CHIEF COMPLAINTS/PURPOSE OF CONSULTATION:  Anemia, referred by GI Dr. Dianna  HISTORY OF PRESENTING ILLNESS:  Reginald Parker 48 y.o. male is here because of anemia. He had a normal CBC 06/04/20 then got COVID-19 in 01/2021.  He was first found to have anemia 03/25/21 with Hgb 11.7. Developed rectal bleeding and symptomatic anemia with left foot pain, presented to ED 09/03/21 with Hgb 5.5, WBC 11.2 and plt 498, and ferritin 5. He was transfused 2 units pRBCs and received IV iron  dextran. Work up showed acute LLE gastrocnemius and posterior tibial DVT with small subsegmental PE in both lung bases, no right heart strain, and was placed on Eliquis  x6 months. Rectal bleeding and DVT/PE felt to be provoked by UC flare and responded to steroids. Discharge Hgb improved to 8.5. Iron  panel 09/17/21 showed serum iron  28, 8% saturation, TIBC 317, and ferritin 95. He was later admitted to [facility] 06/2022 with acute on chronic anemia. EGD/colonoscopy showed moderately active extensive UC from rectum to the cecum. 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 Post op Hgb 11.5 - 12 with normal MCV. Anemia resolved and CBC normalized by 11/27/2022 Hgb 14.5. 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 He had mild post op anemia 11.5 - 11.9. Hgb remained normal 07/2023 but dropped some by 11/03/23 when he began experiencing once monthly rectal bleeding, he attributed to eating out so he quit doing so, however the bleeding now occurs once weekly  for the past month. Labs 05/03/24 showed 7.2 with serum iron  13, 4% saturation TIBC 307, and ferritin 8. EGD 05/16/24 by Dr. Dianna showed gastritis; flex sig showed congested, erythematous, inflamed and petechial mucosa from 0 to 30 cm proximal to the anus. He began oral iron  po daily. Eats a normal/regular nonvegetarian diet. Not currently on anticoagulation or NSAIDs.  Socially he lives with his domestic partner of 9 years, they share 15-year-old boy/girl twins.  Works as a Curator but otherwise sedentary, independent with ADLs.  Rarely drinks alcohol with no prior history of heavy use, never smoker, and denies other drug use. Denies family history of DVT/PE, anemia, or cancer.  Today he presents by himself, declined a Spanish speaking interpreter.  Feels normal except tired after work, I am 47 but feel like I am 59.  Tolerating oral iron  p.o. daily.  Stools are mostly formed with occasional diarrhea.  He gets periodic leg swelling, left greater than right since DVT, but more in the past month and becoming painful.  Denies cough, chest pain, dyspnea, or history of CHF.    MEDICAL HISTORY:  Past Medical History:  Diagnosis Date   Crohn's disease (HCC)    universal colitis   Elevated liver function tests    Osteoporosis 06/01/2015   Premature ventricular contractions    Primary sclerosing cholangitis    Seasonal allergies     SURGICAL HISTORY: Past Surgical History:  Procedure Laterality Date   ESOPHAGOGASTRODUODENOSCOPY N/A 05/16/2024   Procedure: EGD (ESOPHAGOGASTRODUODENOSCOPY);  Surgeon: Dianna Specking, MD;  Location: [facility] ENDOSCOPY;  Service: Gastroenterology;  Laterality: N/A;   FLEXIBLE SIGMOIDOSCOPY N/A 05/16/2024  Procedure: KINGSTON SIDE;  Surgeon: Dianna Specking, MD;  Location: [facility] ENDOSCOPY;  Service: Gastroenterology;  Laterality: N/A;   LAPAROSCOPIC TOTAL ABDOMINAL COLECTOMY  07/29/2022   w/ end ileostomy   NASAL SINUS SURGERY  11/24/2010   PROCTECTOMY W/  CREATION OF COLON RESERVOIR  01/09/2023    SOCIAL HISTORY: Social History   Socioeconomic History   Marital status: Significant Other    Spouse name: Not on file   Number of children: 2   Years of education: Not on file   Highest education level: Not on file  Occupational History   Occupation: Curator  Tobacco Use   Smoking status: Never   Smokeless tobacco: Never  Vaping Use   Vaping status: Never Used  Substance and Sexual Activity   Alcohol use: Not Currently   Drug use: No   Sexual activity: Not on file  Other Topics Concern   Not on file  Social History Narrative   Original from [place]   Lives w/ girlfriend in [place] amd his two sons   Twins 2018   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)   Social Drivers of Corporate investment banker Strain: Low Risk  (07/30/2022)   Received from [facility]   Overall Financial Resource Strain (CARDIA)    Difficulty of Paying Living Expenses: Not hard at all  Food Insecurity: No Food Insecurity (05/31/2024)   Hunger Vital Sign    Worried About Running Out of Food in the Last Year: Never true    Ran Out of Food in the Last Year: Never true  Transportation Needs: No Transportation Needs (05/31/2024)   PRAPARE - Administrator, Civil Service (Medical): No    Lack of Transportation (Non-Medical): No  Physical Activity: Inactive (07/30/2022)   Received from [facility]   Exercise Vital Sign    On average, how many days per week do you engage in moderate to strenuous exercise (like a brisk walk)?: 0 days    On average, how many minutes do you engage in exercise at this level?: 0 min  Stress: No Stress Concern Present (07/30/2022)   Received from [facility] of Occupational Health - Occupational Stress Questionnaire    Feeling of Stress : Not at all  Social Connections: Unknown (10/15/2022)   Received from [facility]   Social Network    Social Network: Not on  file  Intimate Partner Violence: Not At Risk (05/31/2024)   Humiliation, Afraid, Rape, and Kick questionnaire    Fear of Current or Ex-Partner: No    Emotionally Abused: No    Physically Abused: No    Sexually Abused: No    FAMILY HISTORY: Family History  Problem Relation Age of Onset   Diabetes Father 25   Colon cancer Neg Hx    Prostate cancer Neg Hx    CAD Neg Hx     ALLERGIES:  is allergic to ibuprofen.  MEDICATIONS:  Current Outpatient Medications  Medication Sig Dispense Refill   alendronate  (FOSAMAX ) 70 MG tablet Take 1 tablet (70 mg total) by mouth once a week. Take with full glass of water on empty stomach 12 tablet 3   budesonide -formoterol  (SYMBICORT ) 160-4.5 MCG/ACT inhaler Inhale 2 puffs into the lungs 2 (two) times daily. 1 each 3   cetirizine (ZYRTEC) 10 MG tablet Take 10 mg by mouth daily.     Cholecalciferol (VITAMIN D ) 50 MCG (2000 UT) CAPS Take by mouth.  fluticasone (FLONASE) 50 MCG/ACT nasal spray Place 1 spray into both nostrils daily.     furosemide  (LASIX ) 20 MG tablet Take 1 tablet (20 mg total) by mouth daily. 10 tablet 0   NON FORMULARY [facility]  Antifungal- (nail)-#1  Dr Loel     potassium chloride  SA (KLOR-CON  M) 20 MEQ tablet Take 1 tablet (20 mEq total) by mouth daily. 10 tablet 0   ursodiol  (ACTIGALL ) 500 MG tablet Take 500 mg by mouth 2 (two) times daily.  5   albuterol  (VENTOLIN  HFA) 108 (90 Base) MCG/ACT inhaler Inhale 2 puffs into the lungs every 6 (six) hours as needed for wheezing or shortness of breath. (Patient not taking: Reported on 05/31/2024) 18 g 5   No current facility-administered medications for this visit.    REVIEW OF SYSTEMS:   Constitutional: Denies fevers, chills, unintentional weight loss, or abnormal night sweats (+) fatigue Eyes: Denies blurriness of vision, double vision or watery eyes (+) chronic allergies Ears, nose, mouth, throat, and face: Denies mucositis or sore throat Respiratory: Denies  cough, dyspnea or wheezes Cardiovascular: Denies palpitation, chest discomfort (+) distal LLE DVT, chronic swelling L>R (+) lower extremity swelling Gastrointestinal:  Denies nausea, heartburn or change in bowel habits (+) Crohn's/UC (+) colectomy (+) rectal bleeding Skin: Denies abnormal skin rashes Lymphatics: Denies new lymphadenopathy or easy bruising Neurological:Denies numbness, tingling or new weaknesses Behavioral/Psych: Mood is stable, no new changes  All other systems were reviewed with the patient and are negative.  PHYSICAL EXAMINATION: ECOG PERFORMANCE STATUS: 1 - Symptomatic but completely ambulatory  Vitals:   05/31/24 0856  BP: 110/60  Pulse: 88  Resp: 17  Temp: 97.8 F (36.6 C)  SpO2: 99%   Filed Weights   05/31/24 0856  Weight: 153 lb 14.4 oz (69.8 kg)    GENERAL:alert, no distress and comfortable SKIN:  no rashes or significant lesions EYES: sclera clear NECK: supple, thyroid  normal size, non-tender, without nodularity LYMPH:  no palpable cervical or supraclavicular lymphadenopathy  LUNGS: clear with normal breathing effort HEART: regular rate & rhythm, no murmurs, L>R pitting lower extremity edema ABDOMEN:abdomen soft, non-tender and normal bowel sounds Musculoskeletal:no cyanosis of digits and no clubbing  PSYCH: alert & oriented x 3 with fluent speech NEURO: no focal motor/sensory deficits  LABORATORY DATA:  I have reviewed the data as listed     Latest Ref Rng & Units 05/03/2024    8:59 AM 11/03/2023    9:14 AM 08/18/2023   12:00 AM  CMP  Glucose 65 - 99 mg/dL 85     BUN 7 - 25 mg/dL 5   9      Creatinine 9.39 - 1.29 mg/dL 9.29   0.7      Sodium 135 - 146 mmol/L 133   138      Potassium 3.5 - 5.3 mmol/L 4.2   4.3      Chloride 98 - 110 mmol/L 103   104      CO2 20 - 32 mmol/L 24   31      Calcium 8.6 - 10.3 mg/dL 7.8   8.2      Total Protein 6.1 - 8.1 g/dL 9.4  8.7    Total Bilirubin 0.2 - 1.2 mg/dL 1.6  1.6    Alkaline Phos 39 - 117 U/L   158  179      AST 10 - 40 U/L 57  11  32      ALT 9 - 46 U/L 47  14  40         This result is from an external source.      Latest Ref Rng & Units 05/31/2024   10:01 AM 05/03/2024    8:59 AM 11/03/2023    9:14 AM  CBC  WBC 4.0 - 10.5 K/uL 6.6  8.3  7.2   Hemoglobin 13.0 - 17.0 g/dL 7.0  7.2  88.0   Hematocrit 39.0 - 52.0 % 25.1  27.3  36.3   Platelets 150 - 400 K/uL 434  495  353.0      RADIOGRAPHIC STUDIES: I have personally reviewed the radiological images as listed and agreed with the findings in the report. No results found.  ASSESSMENT & PLAN: 48 year old male   Anemia -We reviewed his medical record, imaging, endoscopies, path, and labs in great detail with the patient. He presented with severe IDA requiring blood transfusion and IV iron  in 08/2021 with diagnosis of Ulcerative colitis.  - IDA resolved after total colectomy 07/29/22 and subsequent surgeries (proctectomy with loop ileostomy 11/06/2023, then ileostomy closure 04/07/2023) -He developed significant IDA Hgb 7.2 in 05/03/24 in the setting of rectal bleeding x6 months. EGD showed gastritis, flex sig showed congestion and erythema in the anus. Capsule endoscopy is pending, which we agree  -Due to his colectomy, he likely will not absorb oral iron  and other nutrients. However we still encouraged iron  rich diet and to continue oral iron .  -His only symptom is mild fatigue, we recommend IV iron , potential risk/benefit, side effects, and possible allergic reaction were discussed. - He agrees to proceed at [facility], prefers multiple short visits rather than fewer longer visits. Plan to give IV venofer 200 mg x5 -Given intermittent normal MCV, this is not thalassemia -Will check B12, MMA, and folate to rule out other nutritional deficiencies  -Will repeat lab a month after IV iron , to evaluate his response. He understands he may need ongoing IV iron  replacement due to his poor absorption -Also understands if anemia does not resolve  with adequate iron  replacement, we may recommend additional work up -F/up in 6 weeks with lab  H/o LLE DVT/PE, leg edema -He had COVID-19 in 01/2021 -08/2021 developed rectal bleeding and symptomatic anemia with left calf pain, presented to ED, work up showed acute LLE gastrocnemius and posterior tibial DVT with small subsegmental PE in both lung bases, no right heart strain -Hypercoagulable panel not done, felt to be provoked by UC flare -Completed 6 months Eluquis  -Had chronic LLE edema since DVT, but in past 6 months has had recurrent bilateral L>R LE edema, with pain in the past month.  -No h/o CHF; no respiratory symptoms  -Likely from sedentary lifestyle and low albuminemia. 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  -Will check SPEP and light chains   Eosinophilia - Absolute eos elevated 10/2023 at 2.6, improved but still elevated 04/2024 at 1.394  - He does have chronic allergies, overall improving; will monitor  PSC - On ursodiol , per GI   PLAN: -Medical record reviewed -Lab today, will call him once all results back -Doppler this week, if negative start lasix /KCL daily x 5-10 days for LE edema and f/up  with PCP, elevate legs, increase protein, wear compression stockings -IV iron  at [facility], consented, (IV venofer 200 mg x5) starting asap -Complete bleeding work up per GI Dr. Dianna, likely capsule endoscopy soon -Lab and f/up with me 1 month after IV iron  -Pt seen with Dr. Lanny   Orders Placed This Encounter  Procedures   CBC with Differential (Cancer Center Only)    Standing Status:   Future    Number of Occurrences:   1    Expiration Date:    05/31/2025   Ferritin    Standing Status:   Future    Number of Occurrences:   1    Expiration Date:   05/31/2025   Iron  and Iron  Binding Capacity ([facility] only)    Standing Status:   Future    Number of Occurrences:   1    Expiration Date:   05/31/2025   Retic Panel    Standing Status:   Future    Number of Occurrences:   1    Expiration Date:   05/31/2025   Vitamin B12    Standing Status:   Future    Number of Occurrences:   1    Expiration Date:   05/31/2025   Methylmalonic acid, serum    Standing Status:   Future    Number of Occurrences:   1    Expiration Date:   05/31/2025   Folate, Serum    Standing Status:   Future    Number of Occurrences:   1    Expiration Date:   05/31/2025   Multiple Myeloma Panel (SPEP&IFE w/QIG)    Standing Status:   Future    Number of Occurrences:   1    Expiration Date:   05/31/2025   Kappa/lambda light chains    Standing Status:   Future    Number of Occurrences:   1    Expiration Date:   05/31/2025      All questions were answered. The patient knows to call the clinic with any problems, questions or concerns.      Srihith Aquilino K Dravyn Severs, NP 05/31/24  Addendum I have seen the patient, examined him. I agree with the assessment and and plan and have edited the notes.   48 year old male with past medical history of Crohn's disease, status post total colectomy, primary sclerosing cholangitis, seasonal allergy, was referred for anemia.  She developed a mild anemia in May 2022, and severe anemia with hemoglobin 5.5 in October 2022, labs showed iron  deficiency, she received IV iron  and blood transfusion.  She was found to have DVT and small segmental PE, for which she received Eliquis  for 6 months.  He is anemia resolved after total colectomy in September 2023, but developed again in June 2025, with rectal bleeding.  Lab reviewed, we recommend IV iron , will monitor his labs closely.  His anemia is mainly related to iron  deficiency, but he may have a component of  anemia of chronic disease also due to his autoimmune disease.  Will also check B12, methylmalonic acid level, and folate to rule out other nutritional anemia.  All questions were answered, I spent a total of 30 minutes for his visit today.  Onita Lanny MD 05/31/2024

## 2024-05-30 NOTE — Telephone Encounter (Signed)
 FYI Only or Action Required?: Action required by provider: medication refill request and update on patient condition.  Patient was last seen in primary care on 05/03/2024 by [name], MD.  Called Nurse Triage reporting Foot Swelling.  Symptoms began several weeks ago.  Interventions attempted: Nothing.  Symptoms are: gradually worsening.  Triage Disposition: Call PCP When Office is Open (overriding See PCP When Office is Open (Within 3 Days))  Patient/caregiver understands and will follow disposition?: Yes                              Copied from CRM [id number]. Topic: Clinical - Red Word Triage >> May 30, 2024  3:39 PM Aisha D wrote: Red Word that prompted transfer to Nurse Triage: feet swelling  Pt is calling to speak with a nurse regarding his feet swelling. Pt stated that his feet is swelling to the point that he is unable to put his shoes on. Pt would like to speak with provider to be prescribed prednisone . Reason for Disposition  Prescription request for new medicine (not a refill)  Answer Assessment - Initial Assessment Questions 1. DRUG NAME: What medicine do you need to have refilled?     Prednisone  4. PRESCRIBING HCP: Who prescribed it? Reason: If prescribed by specialist, call should be referred to that group.     PCP 5. SYMPTOMS: Do you have any symptoms?     Bilateral feet swelling     Patient was seen in office by PCP on 05/03/24 for bilateral feet swelling. Patient stated his provider was supposed to prescribe prednisone . Patient stated this medication has helped this issue in the past. Patient stated his feet are becoming painful. Patient is requesting prednisone , rather than an additional OV at this time. Please advise.  Protocols used: Medication Refill and Renewal Call-A-AH

## 2024-05-31 ENCOUNTER — Encounter: Payer: Self-pay | Admitting: Nurse Practitioner

## 2024-05-31 ENCOUNTER — Other Ambulatory Visit: Payer: Self-pay | Admitting: Nurse Practitioner

## 2024-05-31 ENCOUNTER — Inpatient Hospital Stay: Attending: Nurse Practitioner | Admitting: Nurse Practitioner

## 2024-05-31 ENCOUNTER — Inpatient Hospital Stay

## 2024-05-31 ENCOUNTER — Telehealth: Payer: Self-pay

## 2024-05-31 VITALS — BP 110/60 | HR 88 | Temp 97.8°F | Resp 17 | Wt 153.9 lb

## 2024-05-31 DIAGNOSIS — D721 Eosinophilia, unspecified: Secondary | ICD-10-CM | POA: Insufficient documentation

## 2024-05-31 DIAGNOSIS — D649 Anemia, unspecified: Secondary | ICD-10-CM

## 2024-05-31 DIAGNOSIS — E8809 Other disorders of plasma-protein metabolism, not elsewhere classified: Secondary | ICD-10-CM | POA: Insufficient documentation

## 2024-05-31 DIAGNOSIS — K5090 Crohn's disease, unspecified, without complications: Secondary | ICD-10-CM | POA: Diagnosis not present

## 2024-05-31 DIAGNOSIS — Z7901 Long term (current) use of anticoagulants: Secondary | ICD-10-CM | POA: Diagnosis not present

## 2024-05-31 DIAGNOSIS — Z86711 Personal history of pulmonary embolism: Secondary | ICD-10-CM | POA: Diagnosis not present

## 2024-05-31 DIAGNOSIS — D509 Iron deficiency anemia, unspecified: Secondary | ICD-10-CM | POA: Diagnosis not present

## 2024-05-31 DIAGNOSIS — Z86718 Personal history of other venous thrombosis and embolism: Secondary | ICD-10-CM | POA: Diagnosis not present

## 2024-05-31 DIAGNOSIS — Z79899 Other long term (current) drug therapy: Secondary | ICD-10-CM | POA: Diagnosis not present

## 2024-05-31 DIAGNOSIS — D508 Other iron deficiency anemias: Secondary | ICD-10-CM

## 2024-05-31 LAB — CBC WITH DIFFERENTIAL (CANCER CENTER ONLY)
Abs Immature Granulocytes: 0.01 K/uL (ref 0.00–0.07)
Basophils Absolute: 0 K/uL (ref 0.0–0.1)
Basophils Relative: 1 %
Eosinophils Absolute: 0.5 K/uL (ref 0.0–0.5)
Eosinophils Relative: 7 %
HCT: 25.1 % — ABNORMAL LOW (ref 39.0–52.0)
Hemoglobin: 7 g/dL — ABNORMAL LOW (ref 13.0–17.0)
Immature Granulocytes: 0 %
Lymphocytes Relative: 19 %
Lymphs Abs: 1.2 K/uL (ref 0.7–4.0)
MCH: 18.1 pg — ABNORMAL LOW (ref 26.0–34.0)
MCHC: 27.9 g/dL — ABNORMAL LOW (ref 30.0–36.0)
MCV: 64.9 fL — ABNORMAL LOW (ref 80.0–100.0)
Monocytes Absolute: 0.5 K/uL (ref 0.1–1.0)
Monocytes Relative: 8 %
Neutro Abs: 4.3 K/uL (ref 1.7–7.7)
Neutrophils Relative %: 65 %
Platelet Count: 434 K/uL — ABNORMAL HIGH (ref 150–400)
RBC: 3.87 MIL/uL — ABNORMAL LOW (ref 4.22–5.81)
RDW: 19.8 % — ABNORMAL HIGH (ref 11.5–15.5)
WBC Count: 6.6 K/uL (ref 4.0–10.5)
nRBC: 0 % (ref 0.0–0.2)

## 2024-05-31 LAB — IRON AND IRON BINDING CAPACITY (CC-WL,HP ONLY)
Iron: 11 ug/dL — ABNORMAL LOW (ref 45–182)
Saturation Ratios: 4 % — ABNORMAL LOW (ref 17.9–39.5)
TIBC: 311 ug/dL (ref 250–450)
UIBC: 300 ug/dL (ref 117–376)

## 2024-05-31 LAB — FERRITIN: Ferritin: 12 ng/mL — ABNORMAL LOW (ref 24–336)

## 2024-05-31 LAB — FOLATE: Folate: 20 ng/mL (ref >5.9)

## 2024-05-31 LAB — RETIC PANEL
Immature Retic Fract: 30.5 % — ABNORMAL HIGH (ref 2.3–15.9)
RBC.: 3.81 MIL/uL — ABNORMAL LOW (ref 4.22–5.81)
Retic Count, Absolute: 87.2 K/uL (ref 19.0–186.0)
Retic Ct Pct: 2.3 % (ref 0.4–3.1)
Reticulocyte Hemoglobin: 17.5 pg — ABNORMAL LOW (ref >27.9)

## 2024-05-31 LAB — VITAMIN B12: Vitamin B-12: 611 pg/mL (ref 180–914)

## 2024-05-31 MED ORDER — POTASSIUM CHLORIDE CRYS ER 20 MEQ PO TBCR: 20.0000 meq | EXTENDED_RELEASE_TABLET | Freq: Every day | ORAL | 0 refills | Status: DC

## 2024-05-31 MED ORDER — FUROSEMIDE 20 MG PO TABS: 20.0000 mg | ORAL_TABLET | Freq: Every day | ORAL | 0 refills | Status: DC

## 2024-05-31 NOTE — Telephone Encounter (Signed)
 Reginald Parker, patient will be scheduled as soon as possible.  Auth Submission: NO AUTH NEEDED Site of care: Site of care: [facility] Payer: [employer] commercial Medication & CPT/J Code(s) submitted: Venofer (Iron  Sucrose) J1756 Diagnosis Code:  Route of submission (phone, fax, portal):  Phone # Fax # Auth type: Buy/Bill PB Units/visits requested: 200mg  x 5 doses Reference number:  Approval from: 05/31/24 to 08/28/24

## 2024-06-01 ENCOUNTER — Ambulatory Visit (HOSPITAL_COMMUNITY)
Admission: RE | Admit: 2024-06-01 | Discharge: 2024-06-01 | Disposition: A | Discharge status: Home / Self Care | Source: Ambulatory Visit | Attending: Vascular Surgery | Admitting: Vascular Surgery

## 2024-06-01 ENCOUNTER — Other Ambulatory Visit: Payer: Self-pay

## 2024-06-01 DIAGNOSIS — D508 Other iron deficiency anemias: Secondary | ICD-10-CM | POA: Insufficient documentation

## 2024-06-01 DIAGNOSIS — Z86718 Personal history of other venous thrombosis and embolism: Secondary | ICD-10-CM | POA: Insufficient documentation

## 2024-06-01 LAB — KAPPA/LAMBDA LIGHT CHAINS
Kappa free light chain: 205 mg/L — ABNORMAL HIGH (ref 3.3–19.4)
Kappa, lambda light chain ratio: 0.78 (ref 0.26–1.65)
Lambda free light chains: 264.4 mg/L — ABNORMAL HIGH (ref 5.7–26.3)

## 2024-06-02 LAB — METHYLMALONIC ACID, SERUM: Methylmalonic Acid, Quantitative: 98 nmol/L (ref 0–378)

## 2024-06-03 LAB — MULTIPLE MYELOMA PANEL, SERUM
Albumin SerPl Elph-Mcnc: 2.6 g/dL — ABNORMAL LOW (ref 2.9–4.4)
Albumin/Glob SerPl: 0.5 — ABNORMAL LOW (ref 0.7–1.7)
Alpha 1: 0.2 g/dL (ref 0.0–0.4)
Alpha2 Glob SerPl Elph-Mcnc: 0.4 g/dL (ref 0.4–1.0)
B-Globulin SerPl Elph-Mcnc: 1 g/dL (ref 0.7–1.3)
Gamma Glob SerPl Elph-Mcnc: 4.5 g/dL — ABNORMAL HIGH (ref 0.4–1.8)
Globulin, Total: 6.2 g/dL — ABNORMAL HIGH (ref 2.2–3.9)
IgA: 258 mg/dL (ref 90–386)
IgG (Immunoglobin G), Serum: 4636 mg/dL — ABNORMAL HIGH (ref 603–1613)
IgM (Immunoglobulin M), Srm: 65 mg/dL (ref 20–172)
M Protein SerPl Elph-Mcnc: 2.5 g/dL — ABNORMAL HIGH
Total Protein ELP: 8.8 g/dL — ABNORMAL HIGH (ref 6.0–8.5)

## 2024-06-06 ENCOUNTER — Other Ambulatory Visit: Payer: Self-pay | Admitting: Nurse Practitioner

## 2024-06-06 ENCOUNTER — Ambulatory Visit

## 2024-06-06 VITALS — BP 109/67 | HR 89 | Temp 98.1°F | Resp 18 | Ht 66.0 in | Wt 151.6 lb

## 2024-06-06 DIAGNOSIS — R779 Abnormality of plasma protein, unspecified: Secondary | ICD-10-CM

## 2024-06-06 DIAGNOSIS — D508 Other iron deficiency anemias: Secondary | ICD-10-CM

## 2024-06-06 DIAGNOSIS — R599 Enlarged lymph nodes, unspecified: Secondary | ICD-10-CM

## 2024-06-06 DIAGNOSIS — D509 Iron deficiency anemia, unspecified: Secondary | ICD-10-CM

## 2024-06-06 MED ORDER — IRON SUCROSE 20 MG/ML IV SOLN
200.0000 mg | Freq: Once | INTRAVENOUS | Status: AC
  Administered 2024-06-06: 200 mg via INTRAVENOUS
  Filled 2024-06-06: qty 10

## 2024-06-06 NOTE — Progress Notes (Signed)
 Diagnosis: Iron Deficiency Anemia  Provider:  Chilton Greathouse MD  Procedure: IV Push  IV Type: Peripheral, IV Location: L Antecubital  Venofer (Iron Sucrose), Dose: 200 mg  Post Infusion IV Care: Observation period completed and Peripheral IV Discontinued  Discharge: Condition: Good, Destination: Home . AVS Provided  Performed by:  Rico Ala, LPN

## 2024-06-07 ENCOUNTER — Other Ambulatory Visit: Payer: Self-pay

## 2024-06-07 ENCOUNTER — Telehealth: Payer: Self-pay | Admitting: Nurse Practitioner

## 2024-06-07 NOTE — Telephone Encounter (Signed)
 Late entry: I reviewed his work up with Dr. Lanny and called patient at 1:30 on 06/08/24 to review labs. He has iron  deficiency and scheduled for IV iron , he will complete that as scheduled. I'm not sure the degree of iron  deficiency explains his anemia Hgb 7.0. Due to the abnormal SPEP and light chains, and anemia, we are recommending bone survey to r/o osseous lesions and bone marrow biopsy to r/o bone marrow condition.   Additionally, the doppler was negative for DVT but did show adenopathy. We are recommending PET scan to r/o lymphoproliferative disease or malignancy.   We will see him back for office visit once all the studies are complete and results are back.   Questions were answered and patient agrees with the plan.   Kemet Nijjar, NP

## 2024-06-08 ENCOUNTER — Ambulatory Visit (INDEPENDENT_AMBULATORY_CARE_PROVIDER_SITE_OTHER): Admitting: Family Medicine

## 2024-06-08 ENCOUNTER — Ambulatory Visit: Payer: Self-pay | Admitting: *Deleted

## 2024-06-08 ENCOUNTER — Encounter: Payer: Self-pay | Admitting: Family Medicine

## 2024-06-08 ENCOUNTER — Ambulatory Visit (INDEPENDENT_AMBULATORY_CARE_PROVIDER_SITE_OTHER)

## 2024-06-08 VITALS — BP 107/68 | HR 88 | Temp 98.1°F | Resp 16 | Ht 66.0 in | Wt 151.6 lb

## 2024-06-08 VITALS — BP 110/64 | HR 100 | Temp 98.0°F | Resp 16 | Ht 66.0 in | Wt 155.0 lb

## 2024-06-08 DIAGNOSIS — M7989 Other specified soft tissue disorders: Secondary | ICD-10-CM

## 2024-06-08 DIAGNOSIS — D509 Iron deficiency anemia, unspecified: Secondary | ICD-10-CM | POA: Diagnosis not present

## 2024-06-08 DIAGNOSIS — D508 Other iron deficiency anemias: Secondary | ICD-10-CM

## 2024-06-08 MED ORDER — POTASSIUM CHLORIDE CRYS ER 20 MEQ PO TBCR: EXTENDED_RELEASE_TABLET | ORAL | 0 refills | Status: DC

## 2024-06-08 MED ORDER — IRON SUCROSE 20 MG/ML IV SOLN
200.0000 mg | Freq: Once | INTRAVENOUS | Status: AC
  Administered 2024-06-08: 200 mg via INTRAVENOUS
  Filled 2024-06-08: qty 10

## 2024-06-08 MED ORDER — FUROSEMIDE 40 MG PO TABS: 40.0000 mg | ORAL_TABLET | Freq: Every day | ORAL | 0 refills | Status: DC

## 2024-06-08 NOTE — Patient Instructions (Addendum)
 For the swelling in your lower extremities, be sure to elevate your legs when able, mind the salt intake, stay physically active and consider wearing compression stockings.  I expect you to urinate 20-30 min after taking your water pill.   Let us  know if you need anything.

## 2024-06-08 NOTE — Telephone Encounter (Signed)
 FYI Only or Action Required?: Action required by provider: request for appointment, medication refill request, and clinical question for provider.  Patient was last seen in primary care on 05/03/2024 by [name], MD.  Called Nurse Triage reporting Foot Swelling.  Symptoms began about a month ago.  Interventions attempted: Prescription medications: lasix  / potassium.  Symptoms are: gradually worsening.  Triage Disposition: See Physician Within 24 Hours  Patient/caregiver understands and will follow disposition?: Yes              Copied from CRM [id number]. Topic: Clinical - Red Word Triage >> Jun 08, 2024 12:07 PM Reginald Parker wrote: Red Word that prompted transfer to Nurse Triage: feet swelling Reason for Disposition  [1] MODERATE leg swelling (e.g., swelling extends up to knees) AND [2] new-onset or getting worse    Extends to shins  Answer Assessment - Initial Assessment Questions Appt scheduled today with other provider due to none available with PCP until 06/13/24. Patient requesting if medication still needed to be prescribed. Almost out of medication prescribed by hematologist       1. ONSET: When did the swelling start? (e.g., minutes, hours, days)     1 month 1/2 2. LOCATION: What part of the leg is swollen?  Are both legs swollen or just one leg?     Bilateral legs up to shins, mostly feet and ankles  3. SEVERITY: How bad is the swelling? (e.g., localized; mild, moderate, severe)     Swelling causing pain esp. Left foot  4. REDNESS: Is there redness or signs of infection?     No  5. PAIN: Is the swelling painful to touch? If Yes, ask: How painful is it?   (Scale 1-10; mild, moderate or severe)     Throbbing , pinching  6. FEVER: Do you have a fever? If Yes, ask: What is it, how was it measured, and when did it start?      na 7. CAUSE: What do you think is causing the leg swelling?     Not sure  8. MEDICAL HISTORY: Do you have a history  of blood clots (e.g., DVT), cancer, heart failure, kidney disease, or liver failure?     See hx  9. RECURRENT SYMPTOM: Have you had leg swelling before? If Yes, ask: When was the last time? What happened that time?     Yes  has been taking medication prescribed by hematologist  10. OTHER SYMPTOMS: Do you have any other symptoms? (e.g., chest pain, difficulty breathing)       No chest pain no difficulty breahting . Waking up feet are not swollen, by end of day swelling noted with worsening pain, has to get bigger size shoes. No redness no itching.  11. PREGNANCY: Is there any chance you are pregnant? When was your last menstrual period?       na  Protocols used: Leg Swelling and Edema-A-AH

## 2024-06-08 NOTE — Progress Notes (Signed)
 Chief Complaint  Patient presents with   Foot Swelling    Foot Swelling    Reginald Parker here for bilateral leg swelling.  Duration: 1.5 months Gets worse throughout the day.  Hx of prolonged bedrest, recent surgery, travel or injury? No Pain the calf? No SOB? No Personal or family history of clot or bleeding disorder? No; had LE US  showing no clots Hx of heart failure, renal failure, hepatic failure? No Lasix  20 mg daily was not helpful. Would take 60 min to urinate.   Past Medical History:  Diagnosis Date   Crohn's disease (HCC)    universal colitis   Elevated liver function tests    Osteoporosis 06/01/2015   Premature ventricular contractions    Primary sclerosing cholangitis    Seasonal allergies    Family History  Problem Relation Age of Onset   Diabetes Father 35   Colon cancer Neg Hx    Prostate cancer Neg Hx    CAD Neg Hx    Past Surgical History:  Procedure Laterality Date   ESOPHAGOGASTRODUODENOSCOPY N/A 05/16/2024   Procedure: EGD (ESOPHAGOGASTRODUODENOSCOPY);  Surgeon: Dianna Specking, MD;  Location[facility] ENDOSCOPY;  Service: Gastroenterology;  Laterality: N/A;   FLEXIBLE SIGMOIDOSCOPY N/A 05/16/2024   Procedure: KINGSTON SIDE;  Surgeon: Dianna Specking, MD;  Location: [facility] ENDOSCOPY;  Service: Gastroenterology;  Laterality: N/A;   LAPAROSCOPIC TOTAL ABDOMINAL COLECTOMY  07/29/2022   w/ end ileostomy   NASAL SINUS SURGERY  11/24/2010   PROCTECTOMY W/ CREATION OF COLON RESERVOIR  01/09/2023    Current Outpatient Medications:    furosemide  (LASIX ) 40 MG tablet, Take 1 tablet (40 mg total) by mouth daily., Disp: 30 tablet, Rfl: 0   albuterol  (VENTOLIN  HFA) 108 (90 Base) MCG/ACT inhaler, Inhale 2 puffs into the lungs every 6 (six) hours as needed for wheezing or shortness of breath. (Patient not taking: Reported on 05/31/2024), Disp: 18 g, Rfl: 5   alendronate  (FOSAMAX ) 70 MG tablet, Take 1 tablet (70 mg total) by mouth once a week. Take with full  glass of water on empty stomach, Disp: 12 tablet, Rfl: 3   budesonide -formoterol  (SYMBICORT ) 160-4.5 MCG/ACT inhaler, Inhale 2 puffs into the lungs 2 (two) times daily., Disp: 1 each, Rfl: 3   cetirizine (ZYRTEC) 10 MG tablet, Take 10 mg by mouth daily., Disp: , Rfl:    Cholecalciferol (VITAMIN D ) 50 MCG (2000 UT) CAPS, Take by mouth., Disp: , Rfl:    fluticasone (FLONASE) 50 MCG/ACT nasal spray, Place 1 spray into both nostrils daily., Disp: , Rfl:    NON FORMULARY, [facility]  Antifungal- (nail)-#1  Dr Loel, Disp: , Rfl:    potassium chloride  SA (KLOR-CON  M) 20 MEQ tablet, Take 1 tab with every dose of Lasix ., Disp: 30 tablet, Rfl: 0   ursodiol  (ACTIGALL ) 500 MG tablet, Take 500 mg by mouth 2 (two) times daily., Disp: , Rfl: 5  BP 110/64 (BP Location: Left Arm, Patient Position: Sitting)   Pulse 100   Temp 98 F (36.7 C) (Oral)   Resp 16   Ht 5' 6 (1.676 m)   Wt 155 lb (70.3 kg)   SpO2 97%   BMI 25.02 kg/m  Gen- awake, alert, appears stated age Heart- RRR, no murmurs, 3+ pitting b/l LE edema worse around the ankles and tapering at the knees Lungs- CTAB, normal effort w/o accessory muscle use MSK- no calf pain Psych: Age appropriate judgment and insight  Localized swelling of both lower extremities - Plan: furosemide  (LASIX ) 40 MG tablet,  potassium chloride  SA (KLOR-CON  M) 20 MEQ tablet  DVT ruled out by other office. Consider increasing dose of Lasix  to 40 mg/d as above. Compression stockings (OTC fine), elevation, mind salt intake and physical activity rec'd. F/u in 2 weeks to reck w Dr. Amon. Pt voiced understanding and agreement to the plan.  [name], DO 06/08/24  5:01 PM

## 2024-06-08 NOTE — Progress Notes (Signed)
 Diagnosis: Iron  Deficiency Anemia  Provider:  Praveen Mannam MD  Procedure: IV Push  IV Type: Peripheral, IV Location: L Antecubital  Venofer  (Iron  Sucrose), Dose: 200 mg  Post Infusion IV Care: Observation period completed and Peripheral IV Discontinued  Discharge: Condition: Good, Destination: Home . AVS Declined  Performed by:  Donny Childes, RN

## 2024-06-09 ENCOUNTER — Ambulatory Visit (HOSPITAL_COMMUNITY)
Admission: RE | Admit: 2024-06-09 | Discharge: 2024-06-09 | Disposition: A | Discharge status: Home / Self Care | Source: Ambulatory Visit | Attending: Nurse Practitioner | Admitting: Nurse Practitioner

## 2024-06-09 DIAGNOSIS — R779 Abnormality of plasma protein, unspecified: Secondary | ICD-10-CM | POA: Insufficient documentation

## 2024-06-10 ENCOUNTER — Ambulatory Visit

## 2024-06-10 ENCOUNTER — Other Ambulatory Visit: Payer: Self-pay | Admitting: *Deleted

## 2024-06-10 VITALS — BP 115/71 | HR 89 | Temp 98.0°F | Resp 16 | Ht 66.0 in | Wt 149.0 lb

## 2024-06-10 DIAGNOSIS — D509 Iron deficiency anemia, unspecified: Secondary | ICD-10-CM | POA: Diagnosis not present

## 2024-06-10 DIAGNOSIS — D508 Other iron deficiency anemias: Secondary | ICD-10-CM

## 2024-06-10 DIAGNOSIS — D649 Anemia, unspecified: Secondary | ICD-10-CM

## 2024-06-10 MED ORDER — IRON SUCROSE 20 MG/ML IV SOLN
200.0000 mg | Freq: Once | INTRAVENOUS | Status: AC
Start: 2024-06-10 — End: 2024-06-10
  Administered 2024-06-10: 200 mg via INTRAVENOUS
  Filled 2024-06-10: qty 10

## 2024-06-10 NOTE — Progress Notes (Signed)
 Diagnosis: Iron Deficiency Anemia  Provider:  Chilton Greathouse MD  Procedure: IV Push  IV Type: Peripheral, IV Location: R Antecubital  Venofer (Iron Sucrose), Dose: 200 mg  Post Infusion IV Care: Observation period completed and Peripheral IV Discontinued  Discharge: Condition: Good, Destination: Home . AVS Declined  Performed by:  Rico Ala, LPN

## 2024-06-10 NOTE — Progress Notes (Signed)
 Received message from NP to schedule pt for BMBX on 06/28/24.  Message sent to scheduling team to schedule infusion appt for 8 am and lab for 930 am.  RN also contacted flow cytometry to confirm appt for 8:30 am.  RN also requested that medical assistant with pt primary provider contact pt regarding appt details, answer any questions pt may have, and educate pt on what to expect during and after procedure.

## 2024-06-13 ENCOUNTER — Ambulatory Visit

## 2024-06-13 ENCOUNTER — Telehealth: Payer: Self-pay | Admitting: Adult Health

## 2024-06-13 VITALS — BP 111/69 | HR 103 | Temp 98.3°F | Resp 14 | Ht 66.0 in | Wt 144.0 lb

## 2024-06-13 DIAGNOSIS — D509 Iron deficiency anemia, unspecified: Secondary | ICD-10-CM | POA: Diagnosis not present

## 2024-06-13 DIAGNOSIS — D508 Other iron deficiency anemias: Secondary | ICD-10-CM

## 2024-06-13 MED ORDER — IRON SUCROSE 20 MG/ML IV SOLN
200.0000 mg | Freq: Once | INTRAVENOUS | Status: AC
  Administered 2024-06-13: 200 mg via INTRAVENOUS
  Filled 2024-06-13: qty 10

## 2024-06-13 NOTE — Telephone Encounter (Signed)
 Spoke with patient confirming upcoming appointment

## 2024-06-13 NOTE — Progress Notes (Signed)
 Diagnosis: Iron  Deficiency Anemia  Provider:  Praveen Mannam MD  Procedure: IV Push  IV Type: Peripheral, IV Location: L Antecubital  Venofer  (Iron  Sucrose), Dose: 200 mg  Post Infusion IV Care: Observation period completed and Peripheral IV Discontinued Pt requested to do 15 minute observation only  Discharge: Condition: Good, Destination: Home . AVS Declined  Performed by:  Maximiano JONELLE Pouch, LPN

## 2024-06-15 ENCOUNTER — Ambulatory Visit

## 2024-06-15 VITALS — BP 105/70 | HR 101 | Temp 98.6°F | Resp 16 | Ht 66.0 in | Wt 144.6 lb

## 2024-06-15 DIAGNOSIS — D509 Iron deficiency anemia, unspecified: Secondary | ICD-10-CM | POA: Diagnosis not present

## 2024-06-15 DIAGNOSIS — D508 Other iron deficiency anemias: Secondary | ICD-10-CM

## 2024-06-15 MED ORDER — IRON SUCROSE 20 MG/ML IV SOLN
200.0000 mg | Freq: Once | INTRAVENOUS | Status: AC
  Administered 2024-06-15: 200 mg via INTRAVENOUS
  Filled 2024-06-15: qty 10

## 2024-06-15 NOTE — Progress Notes (Signed)
 Diagnosis: Iron Deficiency Anemia  Provider:  Chilton Greathouse MD  Procedure: IV Push  IV Type: Peripheral, IV Location: L Antecubital  Venofer (Iron Sucrose), Dose: 200 mg  Post Infusion IV Care: Observation period completed and Peripheral IV Discontinued  Discharge: Condition: Good, Destination: Home . AVS Declined  Performed by:  Rico Ala, LPN

## 2024-06-16 DIAGNOSIS — D509 Iron deficiency anemia, unspecified: Secondary | ICD-10-CM | POA: Diagnosis not present

## 2024-06-17 ENCOUNTER — Telehealth: Payer: Self-pay

## 2024-06-17 ENCOUNTER — Ambulatory Visit: Payer: Self-pay | Admitting: Nurse Practitioner

## 2024-06-17 NOTE — Telephone Encounter (Addendum)
 Called patient and relayed message below as per Lacie Burton NP. Patient voiced full understanding and had no further questions at this time.    ----- Message from Lacie K Burton sent at 06/17/2024  1:32 PM EDT ----- Please let patient know the bone survey is negative, no lesions. This is good. Recommend to complete the scheduled work up including PET and bone marrow biopsy. We will review all results at his  f/up.   Thanks Lacie NP ----- Message ----- From: Interface, Rad Results In Sent: 06/15/2024  10:27 PM EDT To: Lacie K Burton, NP

## 2024-06-17 NOTE — Telephone Encounter (Addendum)
 Called patient to review instructions for his bone biopsy. Patient had full understanding and this CMA answered all of his questions to the patients satisfaction. He had no further questions at this time. Patient speaks english as his primary language and we did not need an interpreter to go over the information.    ----- Message from Nurse Andrea R sent at 06/15/2024  1:41 PM EDT ----- I believe Andoni Busch did call her. I gave him the instructions to go over with them. I did attempt to call Mliss to see if she was available but I had to leave her a voicemail and I did not hear back from her. Andrea CHRISTELLA Plunk, RN ----- Message ----- From: Wallene Larraine CROME, RN Sent: 06/14/2024   7:43 AM EDT To: Rudean CROME Franks, LPN; Norleen KANDICE Spain, CMA; La#  Just wanted to follow up.  Did the nurse with Lacie contact the patient to give instructions? ----- Message ----- From: Plunk Andrea CHRISTELLA, RN Sent: 06/13/2024  10:14 AM EDT To: Rudean CROME Franks, LPN; Norleen KANDICE Spain, CMA; Ke#  To close the loop, Patient is scheduled for 8/5 at 800 for BMBX with Morna. Flow will be there at 830. Spanish Interpreter has been contacted and awaiting reply. Lab is scheduled following the procedure. Thanks! Andrea CHRISTELLA Plunk, RN ----- Message ----- From: Wallene Larraine CROME, RN Sent: 06/10/2024   1:30 PM EDT To: Rudean CROME Franks, LPN; Norleen KANDICE [place], CMA; La#  [place], he also needs a spanish interpreter for the procedure ----- Message ----- From: Franks Rudean CROME, LPN Sent: 2/81/7974   9:04 AM EDT To: Norleen KANDICE Spain, CMA; Lacie K Burton, NP; Mari#  Can someone have these scheduled. I'm with Johnston today. I blocked her scheduled for August 5th the other day. ----- Message ----- From: Crawford Morna Pickle, NP Sent: 06/10/2024   8:42 AM EDT To: Rudean CROME Franks, LPN  Sounds good. We will need to make sure we have a spanish interpreter present.   Appt time should be at 8 am Flow lab at 830 for BM start time My schedule blocked at  0840.   Thanks, Morna ----- Message ----- From: Franks Rudean CROME, LPN Sent: 2/83/7974   8:56 AM EDT To: Morna Pickle Crawford, NP; Lacie K Burton,#  August 5th you have availability ----- Message ----- From: Crawford Morna Pickle, NP Sent: 06/07/2024   7:47 AM EDT To: Lacie K Burton, NP; [facility]  Nursing--will you see when I might be available for bone marrow biopsy? ----- Message ----- From: Trudy Friday Sent: 06/06/2024   2:25 PM EDT To: Winton JAYSON Ruthe Morna Pickle Crawford, NP;#  Pet authorized ----- Message ----- From: Burton, Lacie K, NP Sent: 06/06/2024   2:00 PM EDT To: Friday Trudy; Winton JAYSON Ruthe Morna Cor#  I spoke to pt and he agrees, I have ordered bone survey and PET to be done ~a week/asap. Morna, can you do his bone marrow biopsy? Once all studies are scheduled, Mariah, please schedule with Dr. Lanny a week bone marrow biopsy  Thanks Lacie ----- Message ----- From: Lanny Callander, MD Sent: 06/05/2024   5:28 PM EDT To: Lacie K Burton, NP  Yes, he needs bone survey and bone marrow biopsy, and CT CAP w contrast (or PET if her insurance approves) to evaluate adenopathy. Please do a phone visit with him and explain to him, thx ----- Message ----- From: Burton, Lacie K, NP Sent: 06/03/2024  11:10 AM EDT To: Callander Lanny, MD  Hi, please review his labs. We saw him for IDA and have him scheduled for IV Venofer  200 mg x5. You noticed high protein and his MM/light chains are abnormal. Do you recommend urine SPEP/bone survey or Bmbx ? Also, vas US  is negative for DVT but shows enlarged LNs? Do you think he needs a pet?  I'll need to call him, we don't have f/up on file until a few months from now.  Thanks Lacie ----- Message ----- From: Burton, Lacie K, NP Sent: 06/03/2024  11:07 AM EDT To: Onita Mattock, MD  Hi, please review his labs. We saw him for IDA and have him scheduled for IV Venofer  200 mg x5. You noticed high protein and his MM/light chains are  abnormal. Do you recommend urine SPEP/bone survey or Bmbx ?  I'll need to call him, we don't have f/up on file until a few months from now.  Thanks Lacie

## 2024-06-20 ENCOUNTER — Ambulatory Visit (HOSPITAL_COMMUNITY): Admission: RE | Admit: 2024-06-20 | Source: Ambulatory Visit

## 2024-06-22 DIAGNOSIS — D649 Anemia, unspecified: Secondary | ICD-10-CM | POA: Diagnosis not present

## 2024-06-28 ENCOUNTER — Ambulatory Visit
Admission: RE | Admit: 2024-06-28 | Discharge: 2024-06-28 | Disposition: A | Discharge status: Home / Self Care | Source: Ambulatory Visit | Attending: Gastroenterology | Admitting: Gastroenterology

## 2024-06-28 ENCOUNTER — Inpatient Hospital Stay

## 2024-06-28 ENCOUNTER — Other Ambulatory Visit: Payer: Self-pay | Admitting: Gastroenterology

## 2024-06-28 ENCOUNTER — Inpatient Hospital Stay: Attending: Nurse Practitioner | Admitting: Adult Health

## 2024-06-28 VITALS — BP 110/67 | HR 64 | Temp 97.8°F | Resp 16

## 2024-06-28 DIAGNOSIS — R634 Abnormal weight loss: Secondary | ICD-10-CM | POA: Diagnosis not present

## 2024-06-28 DIAGNOSIS — K2970 Gastritis, unspecified, without bleeding: Secondary | ICD-10-CM | POA: Insufficient documentation

## 2024-06-28 DIAGNOSIS — D649 Anemia, unspecified: Secondary | ICD-10-CM

## 2024-06-28 DIAGNOSIS — Z9049 Acquired absence of other specified parts of digestive tract: Secondary | ICD-10-CM | POA: Insufficient documentation

## 2024-06-28 DIAGNOSIS — Z8616 Personal history of COVID-19: Secondary | ICD-10-CM | POA: Insufficient documentation

## 2024-06-28 DIAGNOSIS — K50911 Crohn's disease, unspecified, with rectal bleeding: Secondary | ICD-10-CM | POA: Insufficient documentation

## 2024-06-28 DIAGNOSIS — D721 Eosinophilia, unspecified: Secondary | ICD-10-CM | POA: Diagnosis not present

## 2024-06-28 DIAGNOSIS — Z86711 Personal history of pulmonary embolism: Secondary | ICD-10-CM | POA: Insufficient documentation

## 2024-06-28 DIAGNOSIS — K56609 Unspecified intestinal obstruction, unspecified as to partial versus complete obstruction: Secondary | ICD-10-CM | POA: Diagnosis not present

## 2024-06-28 DIAGNOSIS — D464 Refractory anemia, unspecified: Secondary | ICD-10-CM | POA: Diagnosis not present

## 2024-06-28 DIAGNOSIS — Z86718 Personal history of other venous thrombosis and embolism: Secondary | ICD-10-CM | POA: Diagnosis not present

## 2024-06-28 DIAGNOSIS — D509 Iron deficiency anemia, unspecified: Secondary | ICD-10-CM | POA: Diagnosis not present

## 2024-06-28 DIAGNOSIS — R600 Localized edema: Secondary | ICD-10-CM | POA: Insufficient documentation

## 2024-06-28 DIAGNOSIS — E8809 Other disorders of plasma-protein metabolism, not elsewhere classified: Secondary | ICD-10-CM | POA: Insufficient documentation

## 2024-06-28 DIAGNOSIS — K5100 Ulcerative (chronic) pancolitis without complications: Secondary | ICD-10-CM

## 2024-06-28 LAB — CBC WITH DIFFERENTIAL (CANCER CENTER ONLY)
Abs Immature Granulocytes: 0.01 K/uL (ref 0.00–0.07)
Basophils Absolute: 0 K/uL (ref 0.0–0.1)
Basophils Relative: 1 %
Eosinophils Absolute: 0.3 K/uL (ref 0.0–0.5)
Eosinophils Relative: 7 %
HCT: 29.2 % — ABNORMAL LOW (ref 39.0–52.0)
Hemoglobin: 8.7 g/dL — ABNORMAL LOW (ref 13.0–17.0)
Immature Granulocytes: 0 %
Lymphocytes Relative: 29 %
Lymphs Abs: 1.3 K/uL (ref 0.7–4.0)
MCH: 23 pg — ABNORMAL LOW (ref 26.0–34.0)
MCHC: 29.8 g/dL — ABNORMAL LOW (ref 30.0–36.0)
MCV: 77.2 fL — ABNORMAL LOW (ref 80.0–100.0)
Monocytes Absolute: 0.5 K/uL (ref 0.1–1.0)
Monocytes Relative: 11 %
Neutro Abs: 2.3 K/uL (ref 1.7–7.7)
Neutrophils Relative %: 52 %
Platelet Count: 306 K/uL (ref 150–400)
RBC: 3.78 MIL/uL — ABNORMAL LOW (ref 4.22–5.81)
Smear Review: NORMAL
WBC Count: 4.4 K/uL (ref 4.0–10.5)
nRBC: 0 % (ref 0.0–0.2)

## 2024-06-28 LAB — SAMPLE TO BLOOD BANK

## 2024-06-28 NOTE — Progress Notes (Signed)
 Patient observed for 30 minutes post BMX. No bleeding noted from biopsy site. Patient with no complaints. Vitals stable. Discharge instructions reviewed.

## 2024-06-28 NOTE — Progress Notes (Signed)
INDICATION: refractory anemia  Brief examination was performed. ENT: adequate airway clearance Heart: regular rate and rhythm.No Murmurs Lungs: clear to auscultation, no wheezes, normal respiratory effort  Bone Marrow Biopsy and Aspiration Procedure Note   Informed consent was obtained and potential risks including bleeding, infection and pain were reviewed with the patient.  The patient's name, date of birth, identification, consent and allergies were verified prior to the start of procedure and time out was performed.  The left posterior iliac crest was chosen as the site of biopsy.  The skin was prepped with ChloraPrep.   8 cc of 2% lidocaine was used to provide local anaesthesia.   10 cc of bone marrow aspirate was obtained followed by 1cm biopsy.  Pressure was applied to the biopsy site and bandage was placed over the biopsy site. Patient was made to lie on the back for 30 mins prior to discharge.  The procedure was tolerated well. COMPLICATIONS: None BLOOD LOSS: none The patient was discharged home in stable condition with a 1 week follow up to review results.  Patient was provided with post bone marrow biopsy instructions and instructed to call if there was any bleeding or worsening pain.  Specimens sent for flow cytometry, cytogenetics and additional studies.  Signed Scot Dock, NP

## 2024-06-28 NOTE — Patient Instructions (Signed)
 Bone Marrow Aspiration and Bone Marrow Biopsy, Adult, Care After The following information offers guidance on how to care for yourself after your procedure. Your health care provider may also give you more specific instructions. If you have problems or questions, contact your health care provider. What can I expect after the procedure? After the procedure, it is common to have: Mild pain and tenderness. Swelling. Bruising. Follow these instructions at home: Incision care  Follow instructions from your health care provider about how to take care of the incision site. Make sure you: Wash your hands with soap and water for at least 20 seconds before and after you change your bandage (dressing). If soap and water are not available, use hand sanitizer. Change your dressing as told by your health care provider. Leave stitches (sutures), skin glue, or adhesive strips in place. These skin closures may need to stay in place for 2 weeks or longer. If adhesive strip edges start to loosen and curl up, you may trim the loose edges. Do not remove adhesive strips completely unless your health care provider tells you to do that. Check your incision site every day for signs of infection. Check for: More redness, swelling, or pain. Fluid or blood. Warmth. Pus or a bad smell. Activity Return to your normal activities as told by your health care provider. Ask your health care provider what activities are safe for you. Do not lift anything that is heavier than 10 lb (4.5 kg), or the limit that you are told, until your health care provider says that it is safe. If you were given a sedative during the procedure, it can affect you for several hours. Do not drive or operate machinery until your health care provider says that it is safe. General instructions  Take over-the-counter and prescription medicines only as told by your health care provider. Do not take baths, swim, or use a hot tub until your health care  provider approves. Ask your health care provider if you may take showers. You may only be allowed to take sponge baths. If directed, put ice on the affected area. To do this: Put ice in a plastic bag. Place a towel between your skin and the bag. Leave the ice on for 20 minutes, 2-3 times a day. If your skin turns bright red, remove the ice right away to prevent skin damage. The risk of skin damage is higher if you cannot feel pain, heat, or cold. Contact a health care provider if: You have signs of infection. Your pain is not controlled with medicine. You have cancer, and a temperature of 100.89F (38C) or higher. Get help right away if: You have a temperature of 101F (38.3C) or higher, or as told by your health care provider. You have bleeding from the incision site that cannot be controlled. This information is not intended to replace advice given to you by your health care provider. Make sure you discuss any questions you have with your health care provider. Document Revised: 03/17/2022 Document Reviewed: 03/17/2022 Elsevier Patient Education  2024 [employer].

## 2024-06-29 ENCOUNTER — Ambulatory Visit (HOSPITAL_BASED_OUTPATIENT_CLINIC_OR_DEPARTMENT_OTHER)
Admission: RE | Admit: 2024-06-29 | Discharge: 2024-06-29 | Disposition: A | Discharge status: Home / Self Care | Source: Ambulatory Visit | Attending: Internal Medicine | Admitting: Internal Medicine

## 2024-06-29 ENCOUNTER — Encounter: Payer: Self-pay | Admitting: Internal Medicine

## 2024-06-29 ENCOUNTER — Ambulatory Visit: Payer: Self-pay

## 2024-06-29 ENCOUNTER — Ambulatory Visit (INDEPENDENT_AMBULATORY_CARE_PROVIDER_SITE_OTHER): Admitting: Internal Medicine

## 2024-06-29 VITALS — BP 106/68 | HR 72 | Temp 97.7°F | Resp 16 | Ht 66.0 in | Wt 146.1 lb

## 2024-06-29 DIAGNOSIS — K50919 Crohn's disease, unspecified, with unspecified complications: Secondary | ICD-10-CM

## 2024-06-29 DIAGNOSIS — M79672 Pain in left foot: Secondary | ICD-10-CM | POA: Insufficient documentation

## 2024-06-29 DIAGNOSIS — M7989 Other specified soft tissue disorders: Secondary | ICD-10-CM | POA: Diagnosis not present

## 2024-06-29 DIAGNOSIS — D509 Iron deficiency anemia, unspecified: Secondary | ICD-10-CM | POA: Diagnosis not present

## 2024-06-29 LAB — BASIC METABOLIC PANEL WITH GFR
BUN: 6 mg/dL (ref 6–23)
CO2: 29 meq/L (ref 19–32)
Calcium: 8.3 mg/dL — ABNORMAL LOW (ref 8.4–10.5)
Chloride: 101 meq/L (ref 96–112)
Creatinine, Ser: 0.76 mg/dL (ref 0.40–1.50)
GFR: 106.16 mL/min (ref >60.00)
Glucose, Bld: 143 mg/dL — ABNORMAL HIGH (ref 70–99)
Potassium: 4.1 meq/L (ref 3.5–5.1)
Sodium: 135 meq/L (ref 135–145)

## 2024-06-29 LAB — URIC ACID: Uric Acid, Serum: 6.7 mg/dL (ref 4.0–7.8)

## 2024-06-29 NOTE — Patient Instructions (Addendum)
 Continue Lasix  40 mg daily and potassium supplements daily. Leg elevation  For pain, Tylenol  500 mg 1 or 2 tablets 3 times a day  Get your blood work  Get a x-ray of the first floor  See your gastroenterologist and hematology as recommended  If the pain does not gradually improve, let me know.  Arrange for office visit with me in 6 weeks

## 2024-06-29 NOTE — Progress Notes (Signed)
 Subjective:    Patient ID: Reginald Parker, male    DOB: [birth year], 48 y.o.   MRN: [id number]  DOS:  06/29/2024 Type of visit - description: Acute  Since the last visit, was diagnosed with severe anemia.  Chart reviewed and summarized at the assessment and plan section.  Also, had lower extremity edema, started Lasix  by hematology, was seen 06/08/2024 here by one of my partners, Lasix  dose increased, since then edema much improved however he has developed L foot pain.  L foot pain  is mostly at the dorsum of the left foot, has also paresthesias at the great toe and second toe. Does not recall the area being red or warm.  No injury.  Other than that, reports GI symptoms are better.  No nausea or vomiting.  No diarrhea.  No blood in the stools. No chest pain or difficulty breathing.  Wt Readings from Last 3 Encounters:  06/29/24 146 lb 2 oz (66.3 kg)  06/15/24 144 lb 9.6 oz (65.6 kg)  06/13/24 144 lb (65.3 kg)    Review of Systems See above   Past Medical History:  Diagnosis Date   Crohn's disease (HCC)    universal colitis   Elevated liver function tests    Osteoporosis 06/01/2015   Premature ventricular contractions    Primary sclerosing cholangitis    Seasonal allergies     Past Surgical History:  Procedure Laterality Date   ESOPHAGOGASTRODUODENOSCOPY N/A 05/16/2024   Procedure: EGD (ESOPHAGOGASTRODUODENOSCOPY);  Surgeon: Dianna Specking, MD;  Location: [facility] ENDOSCOPY;  Service: Gastroenterology;  Laterality: N/A;   FLEXIBLE SIGMOIDOSCOPY N/A 05/16/2024   Procedure: KINGSTON SIDE;  Surgeon: Dianna Specking, MD;  Location: [facility] ENDOSCOPY;  Service: Gastroenterology;  Laterality: N/A;   LAPAROSCOPIC TOTAL ABDOMINAL COLECTOMY  07/29/2022   w/ end ileostomy   NASAL SINUS SURGERY  11/24/2010   PROCTECTOMY W/ CREATION OF COLON RESERVOIR  01/09/2023    Current Outpatient Medications  Medication Instructions   albuterol  (VENTOLIN  HFA) 108 (90 Base) MCG/ACT  inhaler 2 puffs, Inhalation, Every 6 hours PRN   alendronate  (FOSAMAX ) 70 mg, Oral, Weekly, Take with full glass of water on empty stomach   budesonide -formoterol  (SYMBICORT ) 160-4.5 MCG/ACT inhaler 2 puffs, Inhalation, 2 times daily   cetirizine (ZYRTEC) 10 mg, Daily   Cholecalciferol (VITAMIN D ) 50 MCG (2000 UT) CAPS Take by mouth.   Cipro 500 mg, 2 times daily   fluticasone (FLONASE) 50 MCG/ACT nasal spray 1 spray, Daily   furosemide  (LASIX ) 40 mg, Oral, Daily   NON FORMULARY [facility]  Antifungal- (nail)-#1  Dr Loel   potassium chloride  SA (KLOR-CON  M) 20 MEQ tablet Take 1 tab with every dose of Lasix .   ursodiol  (ACTIGALL ) 500 mg, 2 times daily       Objective:   Physical Exam BP 106/68   Pulse 72   Temp 97.7 F (36.5 C) (Oral)   Resp 16   Ht 5' 6 (1.676 m)   Wt 146 lb 2 oz (66.3 kg)   BMI 23.59 kg/m  General:   Well developed, NAD, BMI noted. HEENT:  Normocephalic . Face symmetric, atraumatic Lungs:  CTA B Normal respiratory effort, no intercostal retractions, no accessory muscle use. Heart: RRR,  no murmur.  Lower extremities: Edema much improved compared to 05/03/2024 when I last saw him. +/+++ Pretibial, slightly more significant around the ankles.  Symmetric. Good pedal pulses Left foot without warmness, redness.  No TTP at the dorsum or toes.. Skin: Not pale. Not jaundice Neurologic:  alert & oriented X3.  Speech normal, gait appropriate for age and unassisted Psych--  Cognition and judgment appear intact.  Cooperative with normal attention span and concentration.  Behavior appropriate. No anxious or depressed appearing.      Assessment     Assessment Hyperglycemia (was on  prednisone ) GI: --Crohn's disease, universal colitis, cscope 09-2014, Dr Dianna --Primary sclerosing cholangitis -s/p prior total abdominal colectomy with end ileostomy on 07/29/2022 - 01/06/2023: robotic proctectomy with ileal pouch anal anastomosis, loop  ileostomy creation   -  04/07/2023 :  loop ileostomy closure   Osteoporosis: d/t steroids, saw endocrine before  Allergies, B-spasm (saw allergist remotely, was rx qvar) Frequent PVCs: Echo 2020 normal, saw cardiology, event monitor 28.8% PVCs.  Rx observation unless symptoms Admitted 08-2021: GI bleed, 2 PRBCs, left leg DVT  PLAN Crohn's disease, primary sclerosis cholangitis: See next. Anemia: At the last visit, hemoglobin noted to dropped from  around 12 to 7.2. Saw GI, EGD showed gastritis, flex sig showed congestion and erythema of the anus, was Rx for capsule endoscopy as well -- pt told results showed some erythema, Rx cipro per GI, reports since then GI symptoms improved. Also, saw hematology, due to previous colectomy was felt not to be absorbing iron .  Was Rx IV iron . In the process, a  BM BX was done d/t elevated serum protein and MM/light chains.  Results pending. Edema US  negative for DVT. Treated with Lasix , initiated by hematology, dose increase to Lasix  40 mg and KCl on 06/08/2024 when he was seen by one of my partners.. At this point, doing better.  Check a BMP. L foot pain and paresthesias. On exam there is no erythema or warmness, area is non-TTP to palpation.  As far as the paresthesias, he denies back pain, recent B12 normal. Plan: X-ray, Tylenol  (least possible dose), check uric acid (started Lasix , gout?).  See AVS RTC 6 weeks.  Follow-up with GI and hematology as recommended

## 2024-06-29 NOTE — Assessment & Plan Note (Signed)
 Crohn's disease, primary sclerosis cholangitis: See next. Anemia: At the last visit, hemoglobin noted to dropped from  around 12 to 7.2. Saw GI, EGD showed gastritis, flex sig showed congestion and erythema of the anus, was Rx for capsule endoscopy as well -- pt told results showed some erythema, Rx cipro per GI, reports since then GI symptoms improved. Also, saw hematology, due to previous colectomy was felt not to be absorbing iron .  Was Rx IV iron . In the process, a  BM BX was done d/t elevated serum protein and MM/light chains.  Results pending. Edema US  negative for DVT. Treated with Lasix , initiated by hematology, dose increase to Lasix  40 mg and KCl on 06/08/2024 when he was seen by one of my partners.. At this point, doing better.  Check a BMP. L foot pain and paresthesias. On exam there is no erythema or warmness, area is non-TTP to palpation.  As far as the paresthesias, he denies back pain, recent B12 normal. Plan: X-ray, Tylenol  (least possible dose), check uric acid (started Lasix , gout?).  See AVS RTC 6 weeks.  Follow-up with GI and hematology as recommended

## 2024-06-30 ENCOUNTER — Encounter (HOSPITAL_COMMUNITY)
Admission: RE | Admit: 2024-06-30 | Discharge: 2024-06-30 | Disposition: A | Discharge status: Home / Self Care | Source: Ambulatory Visit | Attending: Nurse Practitioner | Admitting: Nurse Practitioner

## 2024-06-30 ENCOUNTER — Other Ambulatory Visit: Payer: Self-pay | Admitting: Family Medicine

## 2024-06-30 DIAGNOSIS — K8301 Primary sclerosing cholangitis: Secondary | ICD-10-CM | POA: Diagnosis not present

## 2024-06-30 DIAGNOSIS — R779 Abnormality of plasma protein, unspecified: Secondary | ICD-10-CM | POA: Insufficient documentation

## 2024-06-30 DIAGNOSIS — R599 Enlarged lymph nodes, unspecified: Secondary | ICD-10-CM | POA: Insufficient documentation

## 2024-06-30 DIAGNOSIS — M7989 Other specified soft tissue disorders: Secondary | ICD-10-CM

## 2024-06-30 DIAGNOSIS — R591 Generalized enlarged lymph nodes: Secondary | ICD-10-CM | POA: Diagnosis not present

## 2024-06-30 LAB — GLUCOSE, CAPILLARY
Glucose-Capillary: 66 mg/dL — ABNORMAL LOW (ref 70–99)
Glucose-Capillary: 71 mg/dL (ref 70–99)
Glucose-Capillary: 75 mg/dL (ref 70–99)

## 2024-06-30 LAB — SURGICAL PATHOLOGY

## 2024-06-30 MED ORDER — FLUDEOXYGLUCOSE F - 18 (FDG) INJECTION
7.3100 | Freq: Once | INTRAVENOUS | Status: AC
  Administered 2024-06-30: 7.31 via INTRAVENOUS

## 2024-07-04 ENCOUNTER — Telehealth: Payer: Self-pay | Admitting: Nurse Practitioner

## 2024-07-04 NOTE — Telephone Encounter (Signed)
 Rescheduled appointments per room/resource. Talked with the patient and he is aware of the changes made to his upcoming appointments.

## 2024-07-11 ENCOUNTER — Other Ambulatory Visit: Payer: Self-pay

## 2024-07-11 DIAGNOSIS — D649 Anemia, unspecified: Secondary | ICD-10-CM

## 2024-07-12 ENCOUNTER — Inpatient Hospital Stay

## 2024-07-12 ENCOUNTER — Ambulatory Visit: Admitting: Nurse Practitioner

## 2024-07-12 ENCOUNTER — Other Ambulatory Visit

## 2024-07-12 ENCOUNTER — Inpatient Hospital Stay: Admitting: Nurse Practitioner

## 2024-07-12 ENCOUNTER — Encounter: Payer: Self-pay | Admitting: Nurse Practitioner

## 2024-07-12 VITALS — BP 112/68 | HR 78 | Temp 97.5°F | Resp 17 | Ht 66.0 in | Wt 142.7 lb

## 2024-07-12 DIAGNOSIS — Z86718 Personal history of other venous thrombosis and embolism: Secondary | ICD-10-CM | POA: Diagnosis not present

## 2024-07-12 DIAGNOSIS — D649 Anemia, unspecified: Secondary | ICD-10-CM | POA: Diagnosis not present

## 2024-07-12 DIAGNOSIS — R599 Enlarged lymph nodes, unspecified: Secondary | ICD-10-CM

## 2024-07-12 DIAGNOSIS — D509 Iron deficiency anemia, unspecified: Secondary | ICD-10-CM | POA: Diagnosis not present

## 2024-07-12 DIAGNOSIS — D508 Other iron deficiency anemias: Secondary | ICD-10-CM

## 2024-07-12 DIAGNOSIS — E8809 Other disorders of plasma-protein metabolism, not elsewhere classified: Secondary | ICD-10-CM | POA: Diagnosis not present

## 2024-07-12 DIAGNOSIS — K2970 Gastritis, unspecified, without bleeding: Secondary | ICD-10-CM | POA: Diagnosis not present

## 2024-07-12 DIAGNOSIS — K50911 Crohn's disease, unspecified, with rectal bleeding: Secondary | ICD-10-CM | POA: Diagnosis not present

## 2024-07-12 DIAGNOSIS — Z9049 Acquired absence of other specified parts of digestive tract: Secondary | ICD-10-CM | POA: Diagnosis not present

## 2024-07-12 DIAGNOSIS — R634 Abnormal weight loss: Secondary | ICD-10-CM | POA: Diagnosis not present

## 2024-07-12 DIAGNOSIS — Z8616 Personal history of COVID-19: Secondary | ICD-10-CM | POA: Diagnosis not present

## 2024-07-12 DIAGNOSIS — D721 Eosinophilia, unspecified: Secondary | ICD-10-CM | POA: Diagnosis not present

## 2024-07-12 DIAGNOSIS — Z86711 Personal history of pulmonary embolism: Secondary | ICD-10-CM | POA: Diagnosis not present

## 2024-07-12 DIAGNOSIS — R779 Abnormality of plasma protein, unspecified: Secondary | ICD-10-CM

## 2024-07-12 DIAGNOSIS — R600 Localized edema: Secondary | ICD-10-CM | POA: Diagnosis not present

## 2024-07-12 LAB — CBC WITH DIFFERENTIAL (CANCER CENTER ONLY)
Abs Immature Granulocytes: 0.01 K/uL (ref 0.00–0.07)
Basophils Absolute: 0.1 K/uL (ref 0.0–0.1)
Basophils Relative: 1 %
Eosinophils Absolute: 0.2 K/uL (ref 0.0–0.5)
Eosinophils Relative: 5 %
HCT: 33.5 % — ABNORMAL LOW (ref 39.0–52.0)
Hemoglobin: 10 g/dL — ABNORMAL LOW (ref 13.0–17.0)
Immature Granulocytes: 0 %
Lymphocytes Relative: 38 %
Lymphs Abs: 1.7 K/uL (ref 0.7–4.0)
MCH: 23.5 pg — ABNORMAL LOW (ref 26.0–34.0)
MCHC: 29.9 g/dL — ABNORMAL LOW (ref 30.0–36.0)
MCV: 78.8 fL — ABNORMAL LOW (ref 80.0–100.0)
Monocytes Absolute: 0.7 K/uL (ref 0.1–1.0)
Monocytes Relative: 16 %
Neutro Abs: 1.8 K/uL (ref 1.7–7.7)
Neutrophils Relative %: 40 %
Platelet Count: 283 K/uL (ref 150–400)
RBC: 4.25 MIL/uL (ref 4.22–5.81)
RDW: 25.8 % — ABNORMAL HIGH (ref 11.5–15.5)
WBC Count: 4.5 K/uL (ref 4.0–10.5)
nRBC: 0 % (ref 0.0–0.2)

## 2024-07-12 LAB — FERRITIN: Ferritin: 32 ng/mL (ref 24–336)

## 2024-07-12 LAB — IRON AND IRON BINDING CAPACITY (CC-WL,HP ONLY)
Iron: 22 ug/dL — ABNORMAL LOW (ref 45–182)
Saturation Ratios: 6 % — ABNORMAL LOW (ref 17.9–39.5)
TIBC: 357 ug/dL (ref 250–450)
UIBC: 335 ug/dL (ref 117–376)

## 2024-07-12 NOTE — Progress Notes (Addendum)
 [facility]   Telephone:([phone] Fax:([phone]) [phone]    Patient Care Team: [name], MD as PCP - General Dianna Specking, MD as Consulting Physician (Gastroenterology)   CHIEF COMPLAINT: Follow-up anemia, review workup  INTERVAL HISTORY Mr. Reginald Parker returns for follow-up as scheduled, last seen by me 05/31/2024.  He subsequently received 1 g IV iron  from 7/14 - 7/23, underwent bone survey, bone marrow biopsy, and PET scan. He tolerated IV iron  and procedures well. Energy improved with IV iron . Has not had rectal bleeding except when he had some constipation on cipro which he has completed. He attributes weight loss to lasix . Appetite is good. Still has intermittent left leg pain after a long day at work. Denies other pain, fever, night sweats, adenopathy, n/v/c/d, chest pain, dyspnea, or other complaints.   ROS  All other systems reviewed and negative  Past Medical History:  Diagnosis Date   Crohn's disease (HCC)    universal colitis   Elevated liver function tests    Osteoporosis 06/01/2015   Premature ventricular contractions    Primary sclerosing cholangitis    Seasonal allergies      Past Surgical History:  Procedure Laterality Date   ESOPHAGOGASTRODUODENOSCOPY N/A 05/16/2024   Procedure: EGD (ESOPHAGOGASTRODUODENOSCOPY);  Surgeon: Dianna Specking, MD;  Location: [facility] ENDOSCOPY;  Service: Gastroenterology;  Laterality: N/A;   FLEXIBLE SIGMOIDOSCOPY N/A 05/16/2024   Procedure: KINGSTON SIDE;  Surgeon: Dianna Specking, MD;  Location: [facility] ENDOSCOPY;  Service: Gastroenterology;  Laterality: N/A;   LAPAROSCOPIC TOTAL ABDOMINAL COLECTOMY  07/29/2022   w/ end ileostomy   NASAL SINUS SURGERY  11/24/2010   PROCTECTOMY W/ CREATION OF COLON RESERVOIR  01/09/2023     Outpatient Encounter Medications as of 07/12/2024  Medication Sig Note   albuterol  (VENTOLIN  HFA) 108 (90 Base) MCG/ACT inhaler Inhale 2 puffs into the lungs every 6 (six) hours as  needed for wheezing or shortness of breath. 11/03/2023: PRN   alendronate  (FOSAMAX ) 70 MG tablet Take 1 tablet (70 mg total) by mouth once a week. Take with full glass of water on empty stomach    budesonide -formoterol  (SYMBICORT ) 160-4.5 MCG/ACT inhaler Inhale 2 puffs into the lungs 2 (two) times daily.    cetirizine (ZYRTEC) 10 MG tablet Take 10 mg by mouth daily.    Cholecalciferol (VITAMIN D ) 50 MCG (2000 UT) CAPS Take by mouth.    CIPRO 500 MG tablet Take 500 mg by mouth 2 (two) times daily.    fluticasone (FLONASE) 50 MCG/ACT nasal spray Place 1 spray into both nostrils daily.    furosemide  (LASIX ) 40 MG tablet Take 1 tablet (40 mg total) by mouth daily.    NON FORMULARY [place] [facility]  Antifungal- (nail)-#1  Dr Loel    potassium chloride  SA (KLOR-CON  M) 20 MEQ tablet Take 1 tab with every dose of Lasix .    ursodiol  (ACTIGALL ) 500 MG tablet Take 500 mg by mouth 2 (two) times daily.    No facility-administered encounter medications on file as of 07/12/2024.     Today's Vitals   07/12/24 0854 07/12/24 0927  BP: 112/68   Pulse: 78   Resp: 17   Temp: (!) 97.5 F (36.4 C)   SpO2: 99%   Weight: 142 lb 11.2 oz (64.7 kg)   Height: 5' 6 (1.676 m)   PainSc: 6  6    Body mass index is 23.03 kg/m.    PHYSICAL EXAM GENERAL: alert, no distress and comfortable SKIN: no rash  EYES:  sclera clear NECK: without mass LYMPH:  no palpable cervical, supraclavicular, or inguinal lymphadenopathy  LUNGS: clear with normal breathing effort HEART: regular rate & rhythm, no lower extremity edema ABDOMEN: abdomen soft, non-tender and normal bowel sounds NEURO: alert & oriented x 3 with fluent speech, no focal motor/sensory deficits   CBC    Latest Ref Rng & Units 07/12/2024    8:27 AM 06/28/2024    9:33 AM 05/31/2024   10:01 AM  CBC  WBC 4.0 - 10.5 K/uL 4.5  4.4  6.6   Hemoglobin 13.0 - 17.0 g/dL 89.9  8.7  7.0   Hematocrit 39.0 - 52.0 % 33.5  29.2  25.1   Platelets 150 - 400  K/uL 283  306  434       CMP     Latest Ref Rng & Units 06/29/2024    8:54 AM 05/03/2024    8:59 AM 11/03/2023    9:14 AM  CMP  Glucose 70 - 99 mg/dL 856  85    BUN 6 - 23 mg/dL 6  5    Creatinine 9.59 - 1.50 mg/dL 9.23  9.29    Sodium 864 - 145 mEq/L 135  133    Potassium 3.5 - 5.1 mEq/L 4.1  4.2    Chloride 96 - 112 mEq/L 101  103    CO2 19 - 32 mEq/L 29  24    Calcium 8.4 - 10.5 mg/dL 8.3  7.8    Total Protein 6.1 - 8.1 g/dL  9.4  8.7   Total Bilirubin 0.2 - 1.2 mg/dL  1.6  1.6   Alkaline Phos 39 - 117 U/L   158   AST 10 - 40 U/L  57  11   ALT 9 - 46 U/L  47  14       ASSESSMENT & PLAN:48 year old male    Anemia -He presented with severe IDA requiring blood transfusion and IV iron  in 08/2021 with diagnosis of Ulcerative colitis.  - IDA resolved after total colectomy 07/29/22 and subsequent surgeries (proctectomy with loop ileostomy 11/06/2023, then ileostomy closure 04/07/2023) -He developed significant IDA Hgb 7.2 in 05/03/24 in the setting of rectal bleeding x6 months. EGD showed gastritis, flex sig showed congestion and erythema in the anus. Capsule endoscopy is pending, which we agree  -Due to his colectomy, he likely will not absorb oral iron  and other nutrients. However we still encouraged iron  rich diet and to continue oral iron .  -No evidence of B12 or folate deficiencies. Not thalassemia  -His only symptom is mild fatigue, he completed 1 g IV iron  and fatigue improved. Tolerated well.  PLAN: Today's Hgb has improved, we will follow-up the pending iron  studies to see if he needs additional IV iron .  Understands he likely will need ongoing replacement due to poor absorption secondary to colectomy.  Lab every 2 months  H/o LLE DVT/PE, leg edema -He had COVID-19 in 01/2021 -08/2021 developed rectal bleeding and symptomatic anemia with left calf pain, presented to ED, work up showed acute LLE gastrocnemius and posterior tibial DVT with small subsegmental PE in both lung bases,  no right heart strain -Hypercoagulable panel not done, felt to be provoked by UC flare -Completed 6 months Eluquis  -Had chronic LLE edema since DVT, but in past 6 months has had recurrent bilateral L>R LE edema, with pain in the past month.  -No h/o CHF; no respiratory symptoms  -Likely from sedentary lifestyle and low albuminemia. Encouraged to increase  dietary protein, elevate legs, and wear compression stockings - Doppler 06/01/2024 was negative for DVT but showed inguinal lymphadenopathy, subsequent PET scan showed enlarged and mildly hypermetabolic pelvic and inguinal lymph nodes which are indeterminate, possibly secondary to [facility] and total colectomy PLAN: Follow-up PET scan findings with pelvic MRI, will call patient with results   Hyperproteinemia  - Total protein 8.7 on 11/03/2023, up to 9.4 on 05/03/2024 with elevated globulin 6.8 -Creatinine is normal, calcium is low; no crab criteria -Reviewed workup which show elevated light chains, IgG, and M protein 2.5.  He underwent bone marrow biopsy which was negative for myeloproliferative process or malignancy.  Reviewed bone survey which was negative and no osseous lesions on PET scan -This is not diagnostic for multiple myeloma, but still could be MGUS and/or secondary to autoimmune. PLAN: monitor light chains/MM panel q3 months     Eosinophilia - Absolute eos elevated 10/2023 at 2.6, improved but still elevated 04/2024 at 1.394  - He does have chronic allergies, overall improving; will monitor -Normal lately, likely resolved    PSC - On ursodiol , per GI    PLAN: -Work up including lab, bone survey, bone marrow biopsy, and PET reviewed -Pelvic MRI in the next 2 weeks, will call with results -Additional IV iron  pending levels from today -Lab q2-3 months, f/up in 6 months or sooner if needed  -Pt seen with Dr. Lanny   Orders Placed This Encounter  Procedures   MR Pelvis W Wo Contrast    Standing Status:   Future    Expiration  Date:   07/12/2025    If indicated for the ordered procedure, I authorize the administration of contrast media per Radiology protocol:   Yes    What is the patient's sedation requirement?:   No Sedation    Does the patient have a pacemaker or implanted devices?:   No    Preferred imaging location?:   [facility] (table limit - 500lbs)   CBC with Differential (Cancer Center Only)    Standing Status:   Standing    Number of Occurrences:   12    Expiration Date:   07/12/2025   CMP (Cancer Center only)    Standing Status:   Standing    Number of Occurrences:   4    Expiration Date:   07/12/2025   Ferritin    Standing Status:   Standing    Number of Occurrences:   12    Expiration Date:   07/12/2025   Iron  and Iron  Binding Capacity ([facility] only)    Standing Status:   Standing    Number of Occurrences:   12    Expiration Date:   07/12/2025   Kappa/lambda light chains    Standing Status:   Standing    Number of Occurrences:   4    Expiration Date:   07/12/2025   Multiple Myeloma Panel (SPEP&IFE w/QIG)    Standing Status:   Standing    Number of Occurrences:   4    Expiration Date:   07/12/2025      All questions were answered. The patient knows to call the clinic with any problems, questions or concerns. No barriers to learning were detected.   Moyses Pavey K Jazmin Vensel, NP 07/12/2024    Addendum I have seen the patient, examined him. I agree with the assessment and and plan and have edited the notes.   I personally reviewed his PET scan images, bone marrow biopsy results and lab results,  and discussed the findings with patient.  Although he does have positive M protein, with immunofixation showed IgG lambda light chain type, but his bone marrow biopsy was normal, and PET scan did not show any bone lesions.  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  PET scan showed a hypermetabolic presacral lesion, possible related to his previous total colectomy, will obtain a pelvic MRI with and without contrast for  further evaluation.  His anemia has much improved after IV iron , will continue to monitor and give IV iron  as needed.  His capsule endoscopy was negative.  We discussed the follow-up plan, and we will call him after the MRI result is back.  I spent a total of 25 minutes for his visit today.  Onita Mattock  07/12/2024

## 2024-07-13 ENCOUNTER — Other Ambulatory Visit: Payer: Self-pay | Admitting: Nurse Practitioner

## 2024-07-13 ENCOUNTER — Telehealth: Payer: Self-pay

## 2024-07-13 ENCOUNTER — Ambulatory Visit: Payer: Self-pay | Admitting: Nurse Practitioner

## 2024-07-13 NOTE — Telephone Encounter (Signed)
 Reginald Parker, patient will be scheduled as soon as possible.  Auth Submission: NO AUTH NEEDED Site of care: Site of care: [facility] Payer: [employer] commercial Medication & CPT/J Code(s) submitted: Venofer  (Iron  Sucrose) J1756 Diagnosis Code:  Route of submission (phone, fax, portal):  Phone # Fax # Auth type: Buy/Bill PB Units/visits requested: 200mg  x 5 doses Reference number:  Approval from: 07/13/24 to 11/12/24

## 2024-07-19 ENCOUNTER — Encounter (HOSPITAL_COMMUNITY): Payer: Self-pay

## 2024-07-22 DIAGNOSIS — K5100 Ulcerative (chronic) pancolitis without complications: Secondary | ICD-10-CM | POA: Diagnosis not present

## 2024-07-22 DIAGNOSIS — K8301 Primary sclerosing cholangitis: Secondary | ICD-10-CM | POA: Diagnosis not present

## 2024-08-03 ENCOUNTER — Ambulatory Visit: Admitting: Internal Medicine

## 2024-08-05 ENCOUNTER — Encounter (HOSPITAL_COMMUNITY): Payer: Self-pay

## 2024-08-06 ENCOUNTER — Ambulatory Visit (HOSPITAL_COMMUNITY)
Admission: RE | Admit: 2024-08-06 | Discharge: 2024-08-06 | Disposition: A | Discharge status: Home / Self Care | Source: Ambulatory Visit | Attending: Nurse Practitioner | Admitting: Nurse Practitioner

## 2024-08-06 DIAGNOSIS — R590 Localized enlarged lymph nodes: Secondary | ICD-10-CM | POA: Diagnosis not present

## 2024-08-06 DIAGNOSIS — R599 Enlarged lymph nodes, unspecified: Secondary | ICD-10-CM | POA: Diagnosis not present

## 2024-08-06 DIAGNOSIS — K5190 Ulcerative colitis, unspecified, without complications: Secondary | ICD-10-CM | POA: Diagnosis not present

## 2024-08-06 DIAGNOSIS — Z9049 Acquired absence of other specified parts of digestive tract: Secondary | ICD-10-CM | POA: Diagnosis not present

## 2024-08-06 MED ORDER — GADOBUTROL 1 MMOL/ML IV SOLN
6.0000 mL | Freq: Once | INTRAVENOUS | Status: AC | PRN
  Administered 2024-08-06: 6 mL via INTRAVENOUS

## 2024-08-10 ENCOUNTER — Ambulatory Visit (INDEPENDENT_AMBULATORY_CARE_PROVIDER_SITE_OTHER)

## 2024-08-10 VITALS — BP 96/62 | HR 83 | Temp 98.1°F | Resp 16 | Ht 66.0 in | Wt 141.6 lb

## 2024-08-10 DIAGNOSIS — D464 Refractory anemia, unspecified: Secondary | ICD-10-CM | POA: Diagnosis not present

## 2024-08-10 DIAGNOSIS — D508 Other iron deficiency anemias: Secondary | ICD-10-CM

## 2024-08-10 MED ORDER — IRON SUCROSE 20 MG/ML IV SOLN
200.0000 mg | Freq: Once | INTRAVENOUS | Status: AC
  Administered 2024-08-10: 200 mg via INTRAVENOUS
  Filled 2024-08-10: qty 10

## 2024-08-10 NOTE — Progress Notes (Signed)
 Diagnosis: Iron  Deficiency Anemia  Provider:  Praveen Mannam MD  Procedure: IV Push  IV Type: Peripheral, IV Location: L Antecubital  Venofer  (Iron  Sucrose), Dose: 200 mg  Post Infusion IV Care: Observation period completed and Peripheral IV Discontinued. 10 minute observation per patient request.  Discharge: Condition: Stable, Destination: Home . AVS Declined  Performed by:  Maximiano JONELLE Pouch, LPN

## 2024-08-12 ENCOUNTER — Ambulatory Visit (INDEPENDENT_AMBULATORY_CARE_PROVIDER_SITE_OTHER)

## 2024-08-12 VITALS — BP 98/66 | HR 77 | Temp 98.0°F | Resp 16 | Ht 66.0 in | Wt 141.8 lb

## 2024-08-12 DIAGNOSIS — D508 Other iron deficiency anemias: Secondary | ICD-10-CM

## 2024-08-12 DIAGNOSIS — D464 Refractory anemia, unspecified: Secondary | ICD-10-CM | POA: Diagnosis not present

## 2024-08-12 MED ORDER — IRON SUCROSE 20 MG/ML IV SOLN
200.0000 mg | Freq: Once | INTRAVENOUS | Status: AC
  Administered 2024-08-12: 200 mg via INTRAVENOUS
  Filled 2024-08-12: qty 10

## 2024-08-12 NOTE — Progress Notes (Signed)
 Diagnosis: Iron Deficiency Anemia  Provider:  Chilton Greathouse MD  Procedure: IV Push  IV Type: Peripheral, IV Location: L Antecubital  Venofer (Iron Sucrose), Dose: 200 mg  Post Infusion IV Care: Patient declined observation and Peripheral IV Discontinued  Discharge: Condition: Good, Destination: Home . AVS Declined  Performed by:  Rico Ala, LPN

## 2024-08-13 ENCOUNTER — Ambulatory Visit (HOSPITAL_COMMUNITY)

## 2024-08-16 ENCOUNTER — Encounter: Payer: Self-pay | Admitting: Internal Medicine

## 2024-08-16 ENCOUNTER — Ambulatory Visit: Admitting: Internal Medicine

## 2024-08-16 VITALS — BP 108/64 | HR 84 | Temp 97.8°F | Resp 16 | Ht 66.0 in | Wt 145.0 lb

## 2024-08-16 DIAGNOSIS — M818 Other osteoporosis without current pathological fracture: Secondary | ICD-10-CM

## 2024-08-16 DIAGNOSIS — D508 Other iron deficiency anemias: Secondary | ICD-10-CM

## 2024-08-16 DIAGNOSIS — Z23 Encounter for immunization: Secondary | ICD-10-CM

## 2024-08-16 DIAGNOSIS — K5100 Ulcerative (chronic) pancolitis without complications: Secondary | ICD-10-CM

## 2024-08-16 NOTE — Patient Instructions (Signed)
 You got your flu shot today Recommend COVID-vaccine this fall  Continue checking your blood pressure regularly. Blood pressure goal:  between 110/65 and  135/85. If he does start to get lower let me know.  We may need to adjust your dose of furosemide .  Go to the front desk for the checkout Please make an appointment for a physical exam in 4 months    STOP BY THE FIRST FLOOR: Arrange for a bone density test

## 2024-08-16 NOTE — Progress Notes (Signed)
 Subjective:    Patient ID: Reginald Parker, male    DOB: [birth year], 48 y.o.   MRN: [id number]  DOS:  08/16/2024 Type of visit - description: Routine checkup  Since the last office visit he feels stable. Still has occasional mild L lower extremity edema at the end of the day associated with mild L foot pain. Saw hematology, note reviewed.  Stools are at baseline with occasionally sees drops of blood when he wipes.  No bloody stools. No fever or chills.  Review of Systems See above   Past Medical History:  Diagnosis Date   Crohn's disease (HCC)    universal colitis   Elevated liver function tests    Osteoporosis 06/01/2015   Premature ventricular contractions    Primary sclerosing cholangitis    Seasonal allergies     Past Surgical History:  Procedure Laterality Date   ESOPHAGOGASTRODUODENOSCOPY N/A 05/16/2024   Procedure: EGD (ESOPHAGOGASTRODUODENOSCOPY);  Surgeon: Dianna Specking, MD;  Location: [facility] ENDOSCOPY;  Service: Gastroenterology;  Laterality: N/A;   FLEXIBLE SIGMOIDOSCOPY N/A 05/16/2024   Procedure: KINGSTON SIDE;  Surgeon: Dianna Specking, MD;  Location: [facility] ENDOSCOPY;  Service: Gastroenterology;  Laterality: N/A;   LAPAROSCOPIC TOTAL ABDOMINAL COLECTOMY  07/29/2022   w/ end ileostomy   NASAL SINUS SURGERY  11/24/2010   PROCTECTOMY W/ CREATION OF COLON RESERVOIR  01/09/2023    Current Outpatient Medications  Medication Instructions   alendronate  (FOSAMAX ) 70 mg, Oral, Weekly, Take with full glass of water on empty stomach   cetirizine (ZYRTEC) 10 mg, Daily   Cholecalciferol (VITAMIN D ) 50 MCG (2000 UT) CAPS Take by mouth.   fluticasone (FLONASE) 50 MCG/ACT nasal spray 1 spray, Daily   furosemide  (LASIX ) 40 mg, Oral, Daily   NON FORMULARY  apothecary  Antifungal- (nail)-#1  Dr Loel   potassium chloride  SA (KLOR-CON  M) 20 MEQ tablet Take 1 tab with every dose of Lasix .   ursodiol  (ACTIGALL ) 500 mg, 2 times daily        Objective:   Physical Exam BP 108/64   Pulse 84   Temp 97.8 F (36.6 C) (Oral)   Resp 16   Ht 5' 6 (1.676 m)   Wt 145 lb (65.8 kg)   SpO2 95%   BMI 23.40 kg/m  General:   Well developed, NAD, BMI noted. HEENT:  Normocephalic . Face symmetric, atraumatic Lungs:  CTA B Normal respiratory effort, no intercostal retractions, no accessory muscle use. Heart: RRR,  no murmur.  Lower extremities: no pretibial edema bilaterally  Skin: Not pale. Not jaundice Neurologic:  alert & oriented X3.  Speech normal, gait appropriate for age and unassisted Psych--  Cognition and judgment appear intact.  Cooperative with normal attention span and concentration.  Behavior appropriate. No anxious or depressed appearing.      Assessment   Assessment Hyperglycemia (was on  prednisone ) GI: --Crohn's disease, universal colitis, cscope 09-2014, Dr Dianna --Primary sclerosing cholangitis -s/p prior total abdominal colectomy with end ileostomy on 07/29/2022 - 01/06/2023: robotic proctectomy with ileal pouch anal anastomosis, loop ileostomy creation   -  04/07/2023 :  loop ileostomy closure   Osteoporosis: d/t steroids, saw endocrine before  Allergies, B-spasm (saw allergist remotely, was rx qvar) Frequent PVCs: Echo 2020 normal, saw cardiology, event monitor 28.8% PVCs.  Rx observation unless symptoms Admitted 08-2021: GI bleed, 2 PRBCs, left leg DVT  PLAN Crohn's disease, primary sclerosis cholangitis: Clinically doing well, bowel movements are at baseline.  See next. Hematology visit 07/12/2024 - IDA: They noted fatigue improved  after iron  IV infusions.  They believe he will continue needing IV iron  due to poor absorption. - Abnormal abdominal PET scan.  They ordered a pelvic MRI, reading pending. - Hyperproteinemia: S/p bone marrow bx nef  for malignancy.  No osseous lesions seen on PET scan.  Potentially could be MGUS or secondary to autoimmune disease.  They plan to monitor the  situation. Edema: Controlled, continue Lasix  and potassium.  Rec to check BPs, if they are getting lower he will let me know.  Last BMP okay. Left foot pain and paresthesias: See LOV, uric acid and x-ray negative.  Offered to sports medicine referral for further eval.  He will call when ready. Osteoporosis: On Fosamax , check DEXA. Preventive care: Flu shot today, recommend COVID booster. RTC CPX 4 months

## 2024-08-16 NOTE — Assessment & Plan Note (Signed)
 Crohn's disease, primary sclerosis cholangitis: Clinically doing well, bowel movements are at baseline.  See next. Hematology visit 07/12/2024 - IDA: They noted fatigue improved after iron  IV infusions.  They believe he will continue needing IV iron  due to poor absorption. - Abnormal abdominal PET scan.  They ordered a pelvic MRI, reading pending. - Hyperproteinemia: S/p bone marrow bx nef  for malignancy.  No osseous lesions seen on PET scan.  Potentially could be MGUS or secondary to autoimmune disease.  They plan to monitor the situation. Edema: Controlled, continue Lasix  and potassium.  Rec to check BPs, if they are getting lower he will let me know.  Last BMP okay. Left foot pain and paresthesias: See LOV, uric acid and x-ray negative.  Offered to sports medicine referral for further eval.  He will call when ready. Osteoporosis: On Fosamax , check DEXA. Preventive care: Flu shot today, recommend COVID booster. RTC CPX 4 months

## 2024-08-17 ENCOUNTER — Ambulatory Visit: Admitting: Internal Medicine

## 2024-08-17 ENCOUNTER — Inpatient Hospital Stay: Attending: Nurse Practitioner | Admitting: Hematology

## 2024-08-17 ENCOUNTER — Ambulatory Visit (INDEPENDENT_AMBULATORY_CARE_PROVIDER_SITE_OTHER)

## 2024-08-17 VITALS — BP 95/60 | HR 66 | Temp 97.7°F | Resp 16 | Ht 66.0 in | Wt 144.0 lb

## 2024-08-17 DIAGNOSIS — Z9049 Acquired absence of other specified parts of digestive tract: Secondary | ICD-10-CM

## 2024-08-17 DIAGNOSIS — D464 Refractory anemia, unspecified: Secondary | ICD-10-CM | POA: Diagnosis not present

## 2024-08-17 DIAGNOSIS — K5190 Ulcerative colitis, unspecified, without complications: Secondary | ICD-10-CM

## 2024-08-17 DIAGNOSIS — K922 Gastrointestinal hemorrhage, unspecified: Secondary | ICD-10-CM

## 2024-08-17 DIAGNOSIS — K91850 Pouchitis: Secondary | ICD-10-CM

## 2024-08-17 DIAGNOSIS — D508 Other iron deficiency anemias: Secondary | ICD-10-CM

## 2024-08-17 DIAGNOSIS — D500 Iron deficiency anemia secondary to blood loss (chronic): Secondary | ICD-10-CM

## 2024-08-17 MED ORDER — IRON SUCROSE 20 MG/ML IV SOLN
200.0000 mg | Freq: Once | INTRAVENOUS | Status: AC
  Administered 2024-08-17: 200 mg via INTRAVENOUS
  Filled 2024-08-17: qty 10

## 2024-08-17 NOTE — Progress Notes (Signed)
 Diagnosis: Iron Deficiency Anemia  Provider:  Chilton Greathouse MD  Procedure: IV Push  IV Type: Peripheral, IV Location: L Antecubital  Venofer (Iron Sucrose), Dose: 200 mg  Post Infusion IV Care: Patient declined observation and Peripheral IV Discontinued  Discharge: Condition: Good, Destination: Home . AVS Declined  Performed by:  Rico Ala, LPN

## 2024-08-18 ENCOUNTER — Other Ambulatory Visit: Payer: Self-pay

## 2024-08-19 ENCOUNTER — Ambulatory Visit

## 2024-08-19 VITALS — BP 103/66 | HR 67 | Temp 97.7°F | Resp 17 | Ht 66.0 in | Wt 144.2 lb

## 2024-08-19 DIAGNOSIS — D464 Refractory anemia, unspecified: Secondary | ICD-10-CM

## 2024-08-19 DIAGNOSIS — D508 Other iron deficiency anemias: Secondary | ICD-10-CM

## 2024-08-19 MED ORDER — IRON SUCROSE 20 MG/ML IV SOLN
200.0000 mg | Freq: Once | INTRAVENOUS | Status: AC
  Administered 2024-08-19: 200 mg via INTRAVENOUS
  Filled 2024-08-19: qty 10

## 2024-08-19 NOTE — Progress Notes (Signed)
 Diagnosis: Iron Deficiency Anemia  Provider:  Chilton Greathouse MD  Procedure: IV Push  IV Type: Peripheral, IV Location: L Antecubital  Venofer (Iron Sucrose), Dose: 200 mg  Post Infusion IV Care: Patient declined observation and Peripheral IV Discontinued  Discharge: Condition: Good, Destination: Home . AVS Declined  Performed by:  Rico Ala, LPN

## 2024-08-22 ENCOUNTER — Ambulatory Visit

## 2024-08-22 VITALS — BP 103/65 | HR 81 | Temp 97.7°F | Resp 18 | Ht 66.0 in | Wt 142.4 lb

## 2024-08-22 DIAGNOSIS — D464 Refractory anemia, unspecified: Secondary | ICD-10-CM

## 2024-08-22 DIAGNOSIS — D508 Other iron deficiency anemias: Secondary | ICD-10-CM

## 2024-08-22 MED ORDER — IRON SUCROSE 20 MG/ML IV SOLN
200.0000 mg | Freq: Once | INTRAVENOUS | Status: AC
  Administered 2024-08-22: 200 mg via INTRAVENOUS
  Filled 2024-08-22: qty 10

## 2024-08-22 NOTE — Progress Notes (Signed)
 Diagnosis: Iron Deficiency Anemia  Provider:  Chilton Greathouse MD  Procedure: IV Push  IV Type: Peripheral, IV Location: L Forearm  Venofer (Iron Sucrose), Dose: 200 mg  Post Infusion IV Care: Patient declined observation and Peripheral IV Discontinued  Discharge: Condition: Good, Destination: Home . AVS Declined  Performed by:  Adriana Mccallum, RN

## 2024-08-23 DIAGNOSIS — D649 Anemia, unspecified: Secondary | ICD-10-CM | POA: Diagnosis not present

## 2024-08-23 DIAGNOSIS — K8301 Primary sclerosing cholangitis: Secondary | ICD-10-CM | POA: Diagnosis not present

## 2024-09-01 NOTE — Progress Notes (Signed)
 [facility]   [phone] [phone]   Clinic Follow up Note   Patient Care Team: [name], MD as PCP - [name], MD as Consulting Physician (Gastroenterology) 08/18/2024  I connected with [name] on 09/01/24 at  3:00 PM EDT by telephone and verified that I am speaking with the correct person using two identifiers.   I discussed the limitations, risks, security and privacy concerns of performing an evaluation and management service by telephone and the availability of in person appointments. I also discussed with the patient that there may be a patient responsible charge related to this service. The patient expressed understanding and agreed to proceed.   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 Reports intermittent hematochezia, particularly after consuming spicy or greasy foods. Currently receiving IV iron  therapy, with improvement in energy levels and anemia after previous treatments. - Continue IV iron  therapy, with the fourth dose scheduled for tomorrow and the fifth dose on Monday. - Monitor blood counts with a follow-up lab in October.  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 No signs of malignancy on MRI. Reports occasional hematochezia, likely related to pouchitis. Previous antibiotics prescribed by Dr. Dianna were ineffective. - Consult with Dr. Dianna or the managing physician for ulcerative colitis regarding treatment options for pouchitis. - Monitor for symptoms such as diarrhea or abdominal cramps and report any changes.  Plan -MRI reviewed with pt, no  concerns for malignancy, I recommend him to f/u with GI Dr.Schooler  -continue IV iron  as scheduled  -lab and f/u next month     Discussed the use of AI scribe software for clinical note transcription with the patient, who gave verbal consent to proceed.  History of Present Illness Reginald Parker is a 48 year old male with ulcerative colitis and anemia who presents for follow-up on anemia and MRI scan findings. He was referred by Dr. Dianna for evaluation of inflammation seen on colonoscopy.  He has a history of ulcerative colitis and underwent colectomy with pouch formation. An MRI performed a week ago, following a PET scan that showed activity between the rectum and tailbone, was done to further evaluate the area. A colonoscopy in June showed inflammation in the pouch but was otherwise normal.  He experiences intermittent bleeding, particularly after consuming spicy or greasy foods, which he believes contributes to his anemia. No diarrhea or abdominal cramps. He is currently receiving IV iron  therapy, having completed three doses with two more scheduled. Previous IV iron  therapy in July improved his anemia by August. He notes increased energy and reduced fatigue since starting the iron  therapy.  He sometimes notices a small amount of blood when wiping, which he associates with his low blood count over time.     REVIEW OF SYSTEMS:   Constitutional: Denies fevers, chills or abnormal weight loss Eyes: Denies blurriness of vision Ears, nose, mouth, throat, and face: Denies mucositis or sore throat Respiratory: Denies cough, dyspnea or wheezes Cardiovascular: Denies palpitation, chest discomfort or lower extremity swelling Gastrointestinal:  Denies nausea, heartburn or change in bowel habits Skin: Denies abnormal skin rashes Lymphatics: Denies new lymphadenopathy or easy bruising Neurological:Denies numbness, tingling or new weaknesses Behavioral/Psych: Mood is  stable, no new changes   All other systems were reviewed with the patient and are negative.  MEDICAL HISTORY:  Past Medical History:  Diagnosis Date   Crohn's disease (HCC)    universal colitis   Elevated liver function tests    Osteoporosis 06/01/2015   Premature ventricular contractions    Primary sclerosing cholangitis    Seasonal allergies     SURGICAL HISTORY: Past Surgical History:  Procedure Laterality Date   ESOPHAGOGASTRODUODENOSCOPY N/A 05/16/2024   Procedure: EGD (ESOPHAGOGASTRODUODENOSCOPY);  Surgeon: Dianna Specking, MD;  Location: [facility] ENDOSCOPY;  Service: Gastroenterology;  Laterality: N/A;   FLEXIBLE SIGMOIDOSCOPY N/A 05/16/2024   Procedure: KINGSTON SIDE;  Surgeon: Dianna Specking, MD;  Location: [facility] ENDOSCOPY;  Service: Gastroenterology;  Laterality: N/A;   LAPAROSCOPIC TOTAL ABDOMINAL COLECTOMY  07/29/2022   w/ end ileostomy   NASAL SINUS SURGERY  11/24/2010   PROCTECTOMY W/ CREATION OF COLON RESERVOIR  01/09/2023    I have reviewed the social history and family history with the patient and they are unchanged from previous note.  ALLERGIES:  is allergic to ibuprofen.  MEDICATIONS:  Current Outpatient Medications  Medication Sig Dispense Refill   alendronate  (FOSAMAX ) 70 MG tablet Take 1 tablet (70 mg total) by mouth once a week. Take with full glass of water on empty stomach 12 tablet 3   cetirizine (ZYRTEC) 10 MG tablet Take 10 mg by mouth daily.     Cholecalciferol (VITAMIN D ) 50 MCG (2000 UT) CAPS Take by mouth.     fluticasone (FLONASE) 50 MCG/ACT nasal spray Place 1 spray into both nostrils daily.     furosemide  (LASIX ) 40 MG tablet Take 1 tablet (40 mg total) by mouth daily. 90 tablet 1   NON FORMULARY [facility]  Antifungal- (nail)-#1  Dr Loel     potassium chloride  SA (KLOR-CON  M) 20 MEQ tablet Take 1 tab with every dose of Lasix . 90 tablet 1   ursodiol  (ACTIGALL ) 500 MG tablet Take 500 mg by mouth 2 (two) times daily.  5   No current  facility-administered medications for this visit.    PHYSICAL EXAMINATION: Not performed   LABORATORY DATA:  I have reviewed the data as listed    Latest Ref Rng & Units 07/12/2024    8:27 AM 06/28/2024    9:33 AM 05/31/2024   10:01 AM  CBC  WBC 4.0 - 10.5 K/uL 4.5  4.4  6.6   Hemoglobin 13.0 - 17.0 g/dL 89.9  8.7  7.0   Hematocrit 39.0 - 52.0 % 33.5  29.2  25.1   Platelets 150 - 400 K/uL 283  306  434         Latest Ref Rng & Units 06/29/2024    8:54 AM 05/03/2024    8:59 AM 11/03/2023    9:14 AM  CMP  Glucose 70 - 99 mg/dL 856  85    BUN 6 - 23 mg/dL 6  5    Creatinine 9.59 - 1.50 mg/dL 9.23  9.29    Sodium 864 - 145 mEq/L 135  133    Potassium 3.5 - 5.1 mEq/L 4.1  4.2    Chloride 96 - 112 mEq/L 101  103    CO2 19 - 32 mEq/L 29  24    Calcium 8.4 - 10.5 mg/dL 8.3  7.8    Total Protein 6.1 - 8.1 g/dL  9.4  8.7   Total Bilirubin 0.2 - 1.2 mg/dL  1.6  1.6   Alkaline Phos 39 -  117 U/L   158   AST 10 - 40 U/L  57  11   ALT 9 - 46 U/L  47  14       RADIOGRAPHIC STUDIES: I have personally reviewed the radiological images as listed and agreed with the findings in the report. No results found.     I discussed the assessment and treatment plan with the patient. The patient was provided an opportunity to ask questions and all were answered. The patient agreed with the plan and demonstrated an understanding of the instructions.   The patient was advised to call back or seek an in-person evaluation if the symptoms worsen or if the condition fails to improve as anticipated.  I provided 15 minutes of non face-to-face telephone visit time during this encounter, including review of chart and various tests results, discussions about plan of care and coordination of care plan.    Onita Mattock, MD 08/18/2024

## 2024-09-06 ENCOUNTER — Inpatient Hospital Stay: Admitting: Nurse Practitioner

## 2024-09-06 ENCOUNTER — Inpatient Hospital Stay: Attending: Nurse Practitioner

## 2024-09-06 DIAGNOSIS — D500 Iron deficiency anemia secondary to blood loss (chronic): Secondary | ICD-10-CM | POA: Diagnosis not present

## 2024-09-06 DIAGNOSIS — K922 Gastrointestinal hemorrhage, unspecified: Secondary | ICD-10-CM | POA: Diagnosis not present

## 2024-09-06 DIAGNOSIS — R779 Abnormality of plasma protein, unspecified: Secondary | ICD-10-CM

## 2024-09-06 DIAGNOSIS — D508 Other iron deficiency anemias: Secondary | ICD-10-CM

## 2024-09-06 DIAGNOSIS — D649 Anemia, unspecified: Secondary | ICD-10-CM

## 2024-09-06 LAB — CMP (CANCER CENTER ONLY)
ALT: 119 U/L — ABNORMAL HIGH (ref 0–44)
AST: 116 U/L — ABNORMAL HIGH (ref 15–41)
Albumin: 3.5 g/dL (ref 3.5–5.0)
Alkaline Phosphatase: 309 U/L — ABNORMAL HIGH (ref 38–126)
Anion gap: 6 (ref 5–15)
BUN: 8 mg/dL (ref 6–20)
CO2: 29 mmol/L (ref 22–32)
Calcium: 9 mg/dL (ref 8.9–10.3)
Chloride: 101 mmol/L (ref 98–111)
Creatinine: 0.79 mg/dL (ref 0.61–1.24)
GFR, Estimated: >60 mL/min (ref >60)
Glucose, Bld: 90 mg/dL (ref 70–99)
Potassium: 3.4 mmol/L — ABNORMAL LOW (ref 3.5–5.1)
Sodium: 136 mmol/L (ref 135–145)
Total Bilirubin: 1.1 mg/dL (ref 0.0–1.2)
Total Protein: 8.5 g/dL — ABNORMAL HIGH (ref 6.5–8.1)

## 2024-09-06 LAB — CBC WITH DIFFERENTIAL (CANCER CENTER ONLY)
Abs Immature Granulocytes: 0.01 K/uL (ref 0.00–0.07)
Basophils Absolute: 0.1 K/uL (ref 0.0–0.1)
Basophils Relative: 1 %
Eosinophils Absolute: 0.2 K/uL (ref 0.0–0.5)
Eosinophils Relative: 3 %
HCT: 37 % — ABNORMAL LOW (ref 39.0–52.0)
Hemoglobin: 12 g/dL — ABNORMAL LOW (ref 13.0–17.0)
Immature Granulocytes: 0 %
Lymphocytes Relative: 25 %
Lymphs Abs: 1.5 K/uL (ref 0.7–4.0)
MCH: 26.6 pg (ref 26.0–34.0)
MCHC: 32.4 g/dL (ref 30.0–36.0)
MCV: 82 fL (ref 80.0–100.0)
Monocytes Absolute: 0.6 K/uL (ref 0.1–1.0)
Monocytes Relative: 11 %
Neutro Abs: 3.5 K/uL (ref 1.7–7.7)
Neutrophils Relative %: 60 %
Platelet Count: 231 K/uL (ref 150–400)
RBC: 4.51 MIL/uL (ref 4.22–5.81)
RDW: 24.6 % — ABNORMAL HIGH (ref 11.5–15.5)
WBC Count: 5.8 K/uL (ref 4.0–10.5)
nRBC: 0 % (ref 0.0–0.2)

## 2024-09-06 LAB — IRON AND IRON BINDING CAPACITY (CC-WL,HP ONLY)
Iron: 113 ug/dL (ref 45–182)
Saturation Ratios: 34 % (ref 17.9–39.5)
TIBC: 330 ug/dL (ref 250–450)
UIBC: 217 ug/dL (ref 117–376)

## 2024-09-06 LAB — FERRITIN: Ferritin: 120 ng/mL (ref 24–336)

## 2024-09-07 ENCOUNTER — Ambulatory Visit: Payer: Self-pay | Admitting: Nurse Practitioner

## 2024-09-07 LAB — KAPPA/LAMBDA LIGHT CHAINS
Kappa free light chain: 102.3 mg/L — ABNORMAL HIGH (ref 3.3–19.4)
Kappa, lambda light chain ratio: 0.93 (ref 0.26–1.65)
Lambda free light chains: 110.2 mg/L — ABNORMAL HIGH (ref 5.7–26.3)

## 2024-09-12 LAB — MULTIPLE MYELOMA PANEL, SERUM
Albumin SerPl Elph-Mcnc: 3.2 g/dL (ref 2.9–4.4)
Albumin/Glob SerPl: 0.7 (ref 0.7–1.7)
Alpha 1: 0.3 g/dL (ref 0.0–0.4)
Alpha2 Glob SerPl Elph-Mcnc: 0.5 g/dL (ref 0.4–1.0)
B-Globulin SerPl Elph-Mcnc: 1.1 g/dL (ref 0.7–1.3)
Gamma Glob SerPl Elph-Mcnc: 3.1 g/dL — ABNORMAL HIGH (ref 0.4–1.8)
Globulin, Total: 5.1 g/dL — ABNORMAL HIGH (ref 2.2–3.9)
IgA: 299 mg/dL (ref 90–386)
IgG (Immunoglobin G), Serum: 3218 mg/dL — ABNORMAL HIGH (ref 603–1613)
IgM (Immunoglobulin M), Srm: 146 mg/dL (ref 20–172)
Total Protein ELP: 8.3 g/dL (ref 6.0–8.5)

## 2024-09-19 ENCOUNTER — Ambulatory Visit (HOSPITAL_BASED_OUTPATIENT_CLINIC_OR_DEPARTMENT_OTHER)
Admission: RE | Admit: 2024-09-19 | Discharge: 2024-09-19 | Disposition: A | Discharge status: Home / Self Care | Source: Ambulatory Visit | Attending: Internal Medicine | Admitting: Internal Medicine

## 2024-09-19 DIAGNOSIS — M818 Other osteoporosis without current pathological fracture: Secondary | ICD-10-CM | POA: Insufficient documentation

## 2024-09-19 DIAGNOSIS — M810 Age-related osteoporosis without current pathological fracture: Secondary | ICD-10-CM | POA: Diagnosis not present

## 2024-09-22 ENCOUNTER — Ambulatory Visit: Payer: Self-pay | Admitting: Internal Medicine

## 2024-09-22 DIAGNOSIS — M818 Other osteoporosis without current pathological fracture: Secondary | ICD-10-CM

## 2024-10-05 DIAGNOSIS — K8301 Primary sclerosing cholangitis: Secondary | ICD-10-CM | POA: Diagnosis not present

## 2024-11-01 ENCOUNTER — Inpatient Hospital Stay: Attending: Nurse Practitioner

## 2024-11-01 DIAGNOSIS — K922 Gastrointestinal hemorrhage, unspecified: Secondary | ICD-10-CM | POA: Insufficient documentation

## 2024-11-01 DIAGNOSIS — R779 Abnormality of plasma protein, unspecified: Secondary | ICD-10-CM

## 2024-11-01 DIAGNOSIS — D649 Anemia, unspecified: Secondary | ICD-10-CM

## 2024-11-01 DIAGNOSIS — D500 Iron deficiency anemia secondary to blood loss (chronic): Secondary | ICD-10-CM | POA: Diagnosis present

## 2024-11-01 DIAGNOSIS — D508 Other iron deficiency anemias: Secondary | ICD-10-CM

## 2024-11-01 LAB — CBC WITH DIFFERENTIAL (CANCER CENTER ONLY)
Abs Immature Granulocytes: 0.01 K/uL (ref 0.00–0.07)
Basophils Absolute: 0 K/uL (ref 0.0–0.1)
Basophils Relative: 1 %
Eosinophils Absolute: 0.2 K/uL (ref 0.0–0.5)
Eosinophils Relative: 4 %
HCT: 36.4 % — ABNORMAL LOW (ref 39.0–52.0)
Hemoglobin: 11.9 g/dL — ABNORMAL LOW (ref 13.0–17.0)
Immature Granulocytes: 0 %
Lymphocytes Relative: 34 %
Lymphs Abs: 1.9 K/uL (ref 0.7–4.0)
MCH: 27.7 pg (ref 26.0–34.0)
MCHC: 32.7 g/dL (ref 30.0–36.0)
MCV: 84.8 fL (ref 80.0–100.0)
Monocytes Absolute: 0.7 K/uL (ref 0.1–1.0)
Monocytes Relative: 13 %
Neutro Abs: 2.7 K/uL (ref 1.7–7.7)
Neutrophils Relative %: 48 %
Platelet Count: 279 K/uL (ref 150–400)
RBC: 4.29 MIL/uL (ref 4.22–5.81)
RDW: 15.6 % — ABNORMAL HIGH (ref 11.5–15.5)
WBC Count: 5.6 K/uL (ref 4.0–10.5)
nRBC: 0 % (ref 0.0–0.2)

## 2024-11-01 LAB — IRON AND IRON BINDING CAPACITY (CC-WL,HP ONLY)
Iron: 31 ug/dL — ABNORMAL LOW (ref 45–182)
Saturation Ratios: 8 % — ABNORMAL LOW (ref 17.9–39.5)
TIBC: 368 ug/dL (ref 250–450)
UIBC: 338 ug/dL

## 2024-11-01 LAB — FERRITIN: Ferritin: 22 ng/mL — ABNORMAL LOW (ref 24–336)

## 2024-11-02 DIAGNOSIS — K8301 Primary sclerosing cholangitis: Secondary | ICD-10-CM | POA: Diagnosis not present

## 2024-11-02 DIAGNOSIS — K5100 Ulcerative (chronic) pancolitis without complications: Secondary | ICD-10-CM | POA: Diagnosis not present

## 2024-11-10 ENCOUNTER — Ambulatory Visit: Payer: Self-pay | Admitting: Nurse Practitioner

## 2024-11-10 NOTE — Telephone Encounter (Addendum)
 As per Lacie Burton NP, called patient to relay the below lab results, patient stated he has already followed up with his GI Dr. Dianna and he is aware of the lab results. Instruted patient to get the ferrous sulfate and take it BID with vitamin C. Patient voiced full understanding and had no further questions at this time.    ----- Message from Lacie Burton, NP sent at 11/10/2024  9:46 AM EST ----- Iron  level has dropped, please assess for bleeding. If he is bleeding, have him contact GI. If he is not taking oral iron , please have him start ferrous sulfate BID with vitamin C. We will repeat lab  at next visit in 12/2024.   Thanks Lacie NP

## 2024-12-20 ENCOUNTER — Ambulatory Visit: Admitting: Internal Medicine

## 2025-01-10 ENCOUNTER — Inpatient Hospital Stay: Admitting: Nurse Practitioner

## 2025-01-10 ENCOUNTER — Inpatient Hospital Stay: Attending: Nurse Practitioner

## 2025-04-03 ENCOUNTER — Ambulatory Visit: Admitting: Endocrinology
